# Patient Record
Sex: Female | Born: 1965 | Race: Black or African American | Hispanic: No | Marital: Married | Smoking: Never smoker
Health system: Southern US, Community
[De-identification: ages and names within clinical notes are randomized; demographics above are authoritative.]

## PROBLEM LIST (undated history)

## (undated) DIAGNOSIS — Z9889 Other specified postprocedural states: Secondary | ICD-10-CM

## (undated) DIAGNOSIS — M549 Dorsalgia, unspecified: Secondary | ICD-10-CM

## (undated) DIAGNOSIS — E7800 Pure hypercholesterolemia, unspecified: Secondary | ICD-10-CM

## (undated) DIAGNOSIS — R600 Localized edema: Secondary | ICD-10-CM

## (undated) DIAGNOSIS — G4730 Sleep apnea, unspecified: Secondary | ICD-10-CM

## (undated) DIAGNOSIS — F419 Anxiety disorder, unspecified: Secondary | ICD-10-CM

## (undated) DIAGNOSIS — D649 Anemia, unspecified: Secondary | ICD-10-CM

## (undated) DIAGNOSIS — R7303 Prediabetes: Secondary | ICD-10-CM

## (undated) DIAGNOSIS — K5090 Crohn's disease, unspecified, without complications: Secondary | ICD-10-CM

## (undated) DIAGNOSIS — E559 Vitamin D deficiency, unspecified: Secondary | ICD-10-CM

## (undated) DIAGNOSIS — J302 Other seasonal allergic rhinitis: Secondary | ICD-10-CM

## (undated) DIAGNOSIS — R112 Nausea with vomiting, unspecified: Secondary | ICD-10-CM

## (undated) DIAGNOSIS — K5900 Constipation, unspecified: Secondary | ICD-10-CM

## (undated) DIAGNOSIS — K219 Gastro-esophageal reflux disease without esophagitis: Secondary | ICD-10-CM

## (undated) DIAGNOSIS — I10 Essential (primary) hypertension: Secondary | ICD-10-CM

## (undated) DIAGNOSIS — M1990 Unspecified osteoarthritis, unspecified site: Secondary | ICD-10-CM

## (undated) DIAGNOSIS — M25569 Pain in unspecified knee: Secondary | ICD-10-CM

## (undated) DIAGNOSIS — E739 Lactose intolerance, unspecified: Secondary | ICD-10-CM

## (undated) DIAGNOSIS — M2550 Pain in unspecified joint: Secondary | ICD-10-CM

## (undated) DIAGNOSIS — R0683 Snoring: Secondary | ICD-10-CM

## (undated) HISTORY — PX: ABDOMINAL HYSTERECTOMY: SHX81

## (undated) HISTORY — DX: Vitamin D deficiency, unspecified: E55.9

## (undated) HISTORY — DX: Lactose intolerance, unspecified: E73.9

## (undated) HISTORY — PX: ESOPHAGOGASTRODUODENOSCOPY: SHX1529

## (undated) HISTORY — PX: COLONOSCOPY: SHX5424

## (undated) HISTORY — PX: COLON SURGERY: SHX602

## (undated) HISTORY — PX: GASTRIC BYPASS: SHX52

## (undated) HISTORY — PX: APPENDECTOMY: SHX54

## (undated) HISTORY — DX: Pain in unspecified joint: M25.50

## (undated) HISTORY — DX: Unspecified osteoarthritis, unspecified site: M19.90

## (undated) HISTORY — DX: Pain in unspecified knee: M25.569

## (undated) HISTORY — DX: Pure hypercholesterolemia, unspecified: E78.00

## (undated) HISTORY — DX: Localized edema: R60.0

## (undated) HISTORY — DX: Constipation, unspecified: K59.00

## (undated) HISTORY — DX: Anemia, unspecified: D64.9

## (undated) HISTORY — DX: Dorsalgia, unspecified: M54.9

## (undated) HISTORY — DX: Anxiety disorder, unspecified: F41.9

## (undated) HISTORY — PX: CHOLECYSTECTOMY: SHX55

## (undated) HISTORY — DX: Gastro-esophageal reflux disease without esophagitis: K21.9

---

## 1986-03-06 HISTORY — PX: CHOLECYSTECTOMY: SHX55

## 1997-07-16 ENCOUNTER — Other Ambulatory Visit: Admission: RE | Admit: 1997-07-16 | Discharge: 1997-07-16 | Payer: Self-pay | Admitting: Obstetrics and Gynecology

## 1998-10-28 ENCOUNTER — Other Ambulatory Visit: Admission: RE | Admit: 1998-10-28 | Discharge: 1998-10-28 | Payer: Self-pay | Admitting: Obstetrics and Gynecology

## 1999-11-01 ENCOUNTER — Other Ambulatory Visit: Admission: RE | Admit: 1999-11-01 | Discharge: 1999-11-01 | Payer: Self-pay | Admitting: Obstetrics and Gynecology

## 2001-02-15 ENCOUNTER — Inpatient Hospital Stay (HOSPITAL_COMMUNITY): Admission: AD | Admit: 2001-02-15 | Discharge: 2001-02-15 | Payer: Self-pay | Admitting: Obstetrics and Gynecology

## 2001-03-29 ENCOUNTER — Inpatient Hospital Stay (HOSPITAL_COMMUNITY): Admission: AD | Admit: 2001-03-29 | Discharge: 2001-03-31 | Payer: Self-pay | Admitting: Obstetrics & Gynecology

## 2001-05-07 ENCOUNTER — Other Ambulatory Visit: Admission: RE | Admit: 2001-05-07 | Discharge: 2001-05-07 | Payer: Self-pay | Admitting: Obstetrics & Gynecology

## 2002-07-04 ENCOUNTER — Other Ambulatory Visit: Admission: RE | Admit: 2002-07-04 | Discharge: 2002-07-04 | Payer: Self-pay | Admitting: Obstetrics and Gynecology

## 2005-01-12 ENCOUNTER — Other Ambulatory Visit: Admission: RE | Admit: 2005-01-12 | Discharge: 2005-01-12 | Payer: Self-pay | Admitting: Obstetrics and Gynecology

## 2005-07-20 ENCOUNTER — Encounter (INDEPENDENT_AMBULATORY_CARE_PROVIDER_SITE_OTHER): Payer: Self-pay | Admitting: Specialist

## 2005-07-20 ENCOUNTER — Inpatient Hospital Stay (HOSPITAL_COMMUNITY): Admission: RE | Admit: 2005-07-20 | Discharge: 2005-07-22 | Payer: Self-pay | Admitting: Obstetrics and Gynecology

## 2005-07-20 HISTORY — PX: TOTAL VAGINAL HYSTERECTOMY: SHX2548

## 2007-01-02 ENCOUNTER — Ambulatory Visit: Payer: Self-pay | Admitting: Surgery

## 2007-01-02 ENCOUNTER — Encounter (INDEPENDENT_AMBULATORY_CARE_PROVIDER_SITE_OTHER): Payer: Self-pay | Admitting: Family Medicine

## 2007-01-02 ENCOUNTER — Ambulatory Visit (HOSPITAL_COMMUNITY): Admission: RE | Admit: 2007-01-02 | Discharge: 2007-01-02 | Payer: Self-pay | Admitting: Family Medicine

## 2007-05-10 ENCOUNTER — Ambulatory Visit (HOSPITAL_BASED_OUTPATIENT_CLINIC_OR_DEPARTMENT_OTHER): Admission: RE | Admit: 2007-05-10 | Discharge: 2007-05-10 | Payer: Self-pay | Admitting: Specialist

## 2007-05-10 HISTORY — PX: KNEE ARTHROSCOPY: SUR90

## 2008-09-11 ENCOUNTER — Encounter: Admission: RE | Admit: 2008-09-11 | Discharge: 2008-09-11 | Payer: Self-pay | Admitting: Rheumatology

## 2010-02-14 ENCOUNTER — Ambulatory Visit: Admission: RE | Admit: 2010-02-14 | Payer: Self-pay | Source: Home / Self Care | Admitting: Specialist

## 2010-05-09 ENCOUNTER — Ambulatory Visit (HOSPITAL_BASED_OUTPATIENT_CLINIC_OR_DEPARTMENT_OTHER)
Admission: RE | Admit: 2010-05-09 | Discharge: 2010-05-09 | Disposition: A | Discharge status: Home / Self Care | Payer: BC Managed Care – PPO | Source: Ambulatory Visit | Attending: Specialist | Admitting: Specialist

## 2010-05-09 DIAGNOSIS — I10 Essential (primary) hypertension: Secondary | ICD-10-CM | POA: Insufficient documentation

## 2010-05-09 DIAGNOSIS — M23305 Other meniscus derangements, unspecified medial meniscus, unspecified knee: Secondary | ICD-10-CM | POA: Insufficient documentation

## 2010-05-09 DIAGNOSIS — M2240 Chondromalacia patellae, unspecified knee: Secondary | ICD-10-CM | POA: Insufficient documentation

## 2010-05-09 DIAGNOSIS — E669 Obesity, unspecified: Secondary | ICD-10-CM | POA: Insufficient documentation

## 2010-05-09 DIAGNOSIS — M1710 Unilateral primary osteoarthritis, unspecified knee: Secondary | ICD-10-CM | POA: Insufficient documentation

## 2010-05-09 HISTORY — PX: KNEE ARTHROSCOPY: SUR90

## 2010-05-09 LAB — POCT HEMOGLOBIN-HEMACUE: Hemoglobin: 13.3 g/dL (ref 12.0–15.0)

## 2010-07-19 NOTE — Op Note (Signed)
Vanessa Dean, Vanessa Dean               ACCOUNT NO.:  [id number]   MEDICAL RECORD NO.:  [id number]          PATIENT TYPE:  AMB   LOCATION:  [facility]                         FACILITY:  [facility]   PHYSICIAN:  Susa Day, M.D.    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   PROCEDURE:  Left knee arthroscopy, partial lateral meniscectomy, partial  synovectomy, chondroplasty of the patella.   BRIEF HISTORY AND INDICATIONS:  The patient is a 45 year old with  refractory knee pain, patellofemoral pain with compression, had some  popping posteriorly.  Suspected lateral meniscus tear.  Indicated for  arthroscopic debridement.  Risks and benefits discussed, including  bleeding, infection, damage to neurovascular structures, DVT, PE,  anesthetic complications, no change in symptoms, worsening of symptoms,  need for repeat debridement, arthroscopic procedure in future, etc.   TECHNIQUE:  With the patient in the supine position after induction of  adequate general anesthesia and 2 grams of Kefzol, the left lower  extremity was prepped and draped in the usual sterile fashion.  A  lateral parapatellar portal and superomedial parapatellar portal were  fashioned with a #11 blade.  Ingress cannula atraumatically placed, and  irrigant was utilized to insufflate the joint.  Under direct  visualization, a medial parapatellar portal was fashioned with a #11  blade after localization with an 18-gauge needle, sparing the medial  meniscus.  Noted was extensive grade 3 changes of the patella.  A 3.5  Cuda shaver was introduced and utilized to perform a chondroplasty of  the patella.  There  was normal patellofemoral tracking.  There was a  small area which  showed some grade 2-3 changes as well.  ACL was  unremarkable.  Gutters were unremarkable.  Lateral compartment revealed  some minor grade 2 to 3 changes of the lateral compartment, degenerative  fraying of the meniscus.  There was also an area of a hypertrophic  synovium that was noted in the lateral compartment as well, and this was  shaved.  A remnant of the meniscus was stable to probe palpation.  We  examined the top and bottom of it as well as the medial compartment.  The medial compartment was essentially unremarkable.  There was normal  femoral condyle, tibial plateau, and meniscus, stable to probe palpation  without evidence of tear.  We copiously lavaged the knee and reexamined  all compartments.  No loose cartilaginous debris was noted.  Reexamined  the patella.  No further pathology amenable to arthroscopic  intervention.  All instrumentation was removed.  Portal was closed with  4-0 nylon simple sutures.  0.25% Marcaine with epinephrine was  infiltrated in the joint.  The wound was dressed sterilely.  The patient  was awakened without difficulty and transported to the recovery room in  satisfactory condition.   The patient tolerated the procedure well, and there were no  complications.     Susa Day, M.D.  Electronically Signed    JB/MEDQ  D:  05/10/2007  T:  05/12/2007  Job:  [id number]

## 2010-07-22 NOTE — Op Note (Signed)
Vanessa Dean, Vanessa Dean NO.:  [id number]   MEDICAL RECORD NO.:  [id number]          PATIENT TYPE:  AMB   LOCATION:  [place]                           FACILITY:  [facility]   PHYSICIAN:  Gus Height, M.D.       DATE OF BIRTH:  December 17, 1965   DATE OF PROCEDURE:  07/20/2005  DATE OF DISCHARGE:                                 OPERATIVE REPORT   PREOPERATIVE DIAGNOSES:  1.  Pelvic relaxation with uterine descensus.  2.  Rectocele.   POSTOPERATIVE DIAGNOSES:  Same   PROCEDURE:  Total vaginal hysterectomy and posterior colporrhaphy.   SURGEON:  Gus Height, M.D.   ASSISTANT:  Barbaraann Rondo, M.D.   ANESTHESIA:  General.   COMPLICATIONS:  None.   JUSTIFICATION:  The patient is a 45 year old black female with symptomatic  pelvic relaxation, with the cervix protruding through the vaginal orifice.  In addition, she is noted to have a large rectocele and possible enterocele.  Because of the symptoms associated with her pelvic relaxation, she presents  now to undergo definitive surgery and repair, with total vaginal  hysterectomy and posterior colporrhaphy and anterior colporrhaphy indicated.  The risks and limitations of the procedure were discussed with the patient.   PROCEDURE:  The patient was taken to the operating and placed in the supine  position.  General anesthesia was administered without difficulty.  She was  then placed in the dorsal lithotomy position and prepped and draped in the  usual sterile fashion.  The bladder was catheterized.  At this point, a  speculum as placed in the vaginal vault.  The cervical lip was grasped with  a tenaculum, with the cervix then presenting past the introitus.  The cervix  was injected with 1% Xylocaine with epinephrine, and then the mucosa was  circumscribed and dissected anteriorly and posteriorly until the peritoneal  reflections could be found.  After this was done, the anterior peritoneum  was entered, and the posterior peritoneum  was entered without difficulty.  Then, using curved Heaney clamps, the uterosacral ligaments were identified,  clamped, cut, suture ligated using suture ligatures of 0 Vicryl.  These were  then held.  Then, the cardinal ligaments were clamped, cut, and suture  ligated using 0 Vicryl suture ligatures.  Additional bites were then taken  of the paracervical fascia, all using curved Heaney clamps and suture  ligatures of 0 Vicryl.  The uterine vessels were then found, clamped, cut,  and suture ligated in a similar fashion.  Dissection continued along the  broad ligament until it was possible to deliver the uterine fundus through  the cul-de-sac.  Then, the additional structures holding the uterus in place  which included the utero-ovarian ligament, fallopian tube, and round  ligament, were clamped with Heaney clamps.  These pedicles were cut, freeing  the specimen.  These large pedicles were then doubly ligated, first using  suture ligatures of 0 Vicryl and free ties of 0 Vicryl.  These pedicles were  held. At this point, the posterior vaginal cuff was run using running-  interlocking 0 Vicryl suture this, but  patient did have a small enterocele.  The enterocele was reduced and then the cul-de-sac plicated using sutures of  0 Vicryl.  Then, the uterosacral ligaments were brought across the cuff to  provide support, with the uterosacral ligaments being transfixed to one  another to supply support to the excess vaginal cuff.  At this point, the  anterior peritoneum was then found, and then the cuff was closed using  running-interlocking 0 Vicryl suture. Good hemostasis was readily achieved.  At this point, inspection was carried out.  Again, at this point it was  apparent that the patient did not have much in the way of cystocele or  urethrocele, but a large rectocele.  The perineal skin was grasped with  Allis clamps, and the posterior vaginal mucosa was injected with 1%  Xylocaine with  epinephrine, and then the dissection was carried out in the  midline, separating the overlying mucosa from the perirectal fascia.  This  was continued for approximately 6 cm into the vaginal vault.  Then, the  rectocele was dissected free from the paravaginal tissues, and the rectocele  was reduced using interrupted 2-0 Vicryl sutures.  It was then possible to  bring the fascia over the reduced rectocele using interrupted 2-0 Vicryl  sutures.  At this point, the excess vaginal mucosa was trimmed, and then the  mucosa and submucosa were reapproximated using running-interlocking 0 Vicryl  sutures.  At this point, 0 Vicryl sutures were placed in the perineal body  to reconstitute the perineal body and reapproximated.  Then, the cutaneous  tissue was closed using running-interlocking 2-0 Vicryl suture.  The  perineal skin was reapproximated using running continuous subcuticular 2-0  Vicryl suture.  There appeared to be good support of the posterior vaginal  wall and good hemostasis.  Foley catheter was inserted.  Clear urine was  obtained.  A 2-inch Iodoform pack was placed in the vaginal vault to provide  pressure to ensure hemostasis.  At this point, the patient is taken out of  lithotomy position, reversed from the anesthetic, and brought to the  recovery room in satisfactory condition.  The estimated blood loss was  approximately to 200 cc.      Gus Height, M.D.  Electronically Signed     AR/MEDQ  D:  07/20/2005  T:  07/21/2005  Job:  [id number]

## 2010-07-22 NOTE — Discharge Summary (Signed)
Vanessa Dean, PORTNOY NO.:  [id number]   MEDICAL RECORD NO.:  [id number]          PATIENT TYPE:  INP   LOCATION:  9311                          FACILITY:  [facility]   PHYSICIAN:  Gus Height, M.D.       DATE OF BIRTH:  04/23/1965   DATE OF ADMISSION:  07/20/2005  DATE OF DISCHARGE:  07/22/2005                                 DISCHARGE SUMMARY   ADMISSION DIAGNOSIS:  Pelvic relaxation with rectocele, enterocele, uterine  descensus.   FINAL DIAGNOSES:  Pelvic relaxation with rectocele, enterocele, uterine  descensus.   OPERATIONS AND PROCEDURES:  Total vaginal hysterectomy, posterior  colporrhaphy, repair of enterocele.   BRIEF HISTORY:  The patient is a 45 year old black female who has had a  history of pelvic pressure and pain, and on examination was noted to have a  large rectocele, as well as her cervix protruding to the vaginal introitus.  Because of the symptoms associated with this defect in the pelvic floor and  her desire to have her symptoms relieved, she was admitted to the hospital  to undergo total vaginal hysterectomy, posterior colporrhaphy and repair of  any other pelvic floor defects identified at the time of surgery.   Preoperative studies were obtained which included an admission hemoglobin of  13.3, hematocrit 38.7, white count 5900.  PT and PTT were within normal  limits.  Chemistry profile was essentially within normal limits.   HOSPITAL COURSE:  Under general anesthesia on Jul 20, 2005, a total vaginal  hysterectomy was carried out.  At the time of surgery, she was also noted to  have an enterocele.  The enterocele was repaired, as well as her large  rectocele.  The patient's postoperative course was essentially  uncomplicated.  She did tolerate increasing ambulation and diet well, and by  the second postoperative day was in satisfactory condition and able to be  discharged home.  Her hemoglobin remained stable with a nadir of 11.7.  The  final  pathology report of the hysterectomy specimen revealed the uterus  weighing 123 gm with an endocervical inclusion cyst.  Proliferative  endometrium was noted, as well as adenomyosis and intramural leiomyomata.  The patient was sent home in satisfactory condition on a regular diet,  instructed to do no heavy lifting, and to call for any problems such as  fever, pain or heavy bleeding.   MEDICATIONS FOR HOME:  Tylox one every 3 hours as needed for pain.   FOLLOWUP:  The patient will be seen back in 4 weeks for a follow up  examination, and she understands to call for any problems such as fever,  pain or heavy bleeding.      Gus Height, M.D.  Electronically Signed     AR/MEDQ  D:  08/10/2005  T:  08/10/2005  Job:  [id number]

## 2010-09-13 NOTE — Op Note (Signed)
  [name], Vanessa Dean               ACCOUNT NO.:  [id number]  MEDICAL RECORD NO.:  [id number]          PATIENT TYPE:  AMB  LOCATION:  [facility]                         FACILITY:  [facility]  PHYSICIAN:  Susa Day, M.D.    DATE OF BIRTH:  Sep 22, 1965  DATE OF PROCEDURE: DATE OF DISCHARGE:                              OPERATIVE REPORT   PREOPERATIVE DIAGNOSIS:  Medial meniscus tear, degenerative joint disease, right knee.  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  PROCEDURE PERFORMED: 1. Right knee arthroscopy. 2. Partial medial meniscectomy. 3. Light chondroplasty of patellofemoral joint and medial compartment.  HISTORY:  A 45 year old with knee pain, locking, giving way.  MRI indicating meniscus tear, DJD, indicated for arthroscopic debridement. Risks and benefits discussed including bleeding, infection, no changes in symptoms, worsening symptoms, need for repeat debridement, DVT, PE and anesthetic complications, etc.  She is also indicated for possible lateral retinacular release but she did not have any patellofemoral pain preoperatively.  TECHNIQUE:  With the patient in supine position after adequate anesthesia and 2 g Kefzol, the right lower extremity was prepped and draped in the usual sterile fashion.  A lateral parapatellar portal and superomedial parapatellar portal was fashioned with a #11 blade. Ingress cannula atraumatically placed.  Irrigant was utilized to insufflate the joint.  Under direct visualization, medial parapatellar portal was fashioned with a #11 blade after localization with an 18- gauge needle sparing the medial meniscus.  Noted was extensive grade 3 changes in the medial compartment, tearing of the medial meniscus, grade 3 changes of the tibia.  I introduced the shaver and performed a light chondroplasty of the femoral  condyle and the tibial plateau.  I shaved the meniscus to the stable base.  Degenerative changes of the posterior portion of the meniscus were stable to probe palpation, however.  I did not see any grade 3 changes but there are couple areas where it was fairly thin.  ACL was essentially unremarkable and I am unable to visualize the PCL. Lateral compartment revealed some mild degenerative changes, grade 2.  Suprapatellar pouch revealed extensive patellofemoral degenerative changes that were throughout not just laterally and actually the patient appeared to be tracking fine, was not tight laterally.  This was with insufflation and releasing the fluid and watching the tracking.  So, I did not feel she would benefit particularly from the lateral retinacular release.  Light chondroplasties were performed here.  The gutters were unremarkable.  I reexamined the medial compartment.  No further pathology amenable arthroscopic intervention.  Therefore, I removed all instrumentation and portals were closed 4-0 nylon simple sutures, 0.25% Marcaine with epinephrine was infiltrated in the joint.  Wound was dressed sterilely.  Woken without difficulty and transported to the recovery in satisfactory condition.  The patient tolerated the procedure well.  No complications.  No assistant.     Susa Day, M.D.     Geralynn Rile  D:  05/09/2010  T:  05/10/2010  Job:  [id number]  Electronically Signed by Susa Day M.D. on 05/13/2010 05:49:43 AM

## 2010-11-28 LAB — POCT I-STAT 4, (NA,K, GLUC, HGB,HCT)
Glucose, Bld: 94
HCT: 41
Hemoglobin: 13.9
Operator id: 268271
Potassium: 3.5
Sodium: 139

## 2011-05-09 ENCOUNTER — Encounter (HOSPITAL_BASED_OUTPATIENT_CLINIC_OR_DEPARTMENT_OTHER): Payer: Self-pay | Admitting: *Deleted

## 2011-05-10 ENCOUNTER — Encounter (HOSPITAL_BASED_OUTPATIENT_CLINIC_OR_DEPARTMENT_OTHER): Payer: Self-pay | Admitting: *Deleted

## 2011-05-11 ENCOUNTER — Other Ambulatory Visit: Payer: Self-pay | Admitting: Orthopedic Surgery

## 2011-05-15 NOTE — H&P (Signed)
Vanessa Dean is an 46 y.o. female.   Chief Complaint: Complaining of bilateral hand numbness and tingling HPI:  . 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 She reports a history of Crohn's disease dating back to age 15. She had some transient arthralgias and underwent a small bowel resection, cholecystectomy, appendectomy and partial colectomy followed by significant relief in her abdominal symptoms. She has had some arthralgias and was thought to have a possible inflammatory bowel disease related arthritis predicament.    Past Medical History  Diagnosis Date  . Arthritis   . Seasonal allergies     TAKES ZYRTEC AS NEEDED    Past Surgical History  Procedure Date  . Knee arthroscopy 05/09/2010    right   . Total vaginal hysterectomy 07/20/2005    posterior colporrhaphy  . Knee arthroscopy 05/10/2007    left  . Colon surgery   . Abdominal hysterectomy   . Appendectomy   . Cholecystectomy     History reviewed. No pertinent family history. Social History:  reports that she has never smoked. She does not have any smokeless tobacco history on file. She reports that she drinks alcohol. She reports that she does not use illicit drugs.  Allergies: No Known Allergies  No current facility-administered medications on file as of .   Medications Prior to Admission  Medication Sig Dispense Refill  . cetirizine (ZYRTEC) 10 MG tablet Take 10 mg by mouth as needed.      . hydrochlorothiazide (MICROZIDE) 12.5 MG capsule Take 12.5 mg by mouth as needed. TAKES AS NEEDED FOR BLOATING      . meloxicam (MOBIC) 15 MG tablet Take 15 mg by mouth as needed.      . Vitamin D, Ergocalciferol, ([name]) 50000 UNITS CAPS Take 50,000 Units by mouth 2 (two) times a week.        No results found for this or any previous visit (from the past 48 hour(s)).  No results found.   Pertinent items are noted in HPI.  Height 5' 5"   (1.651 m), weight 135.172 kg (298 lb).  General appearance: alert Head: Normocephalic, without obvious abnormality Neck: supple, symmetrical, trachea midline Resp: clear to auscultation bilaterally Cardio: regular rate and rhythm, S1, S2 normal, no murmur, click, rub or gallop GI: normal findings: bowel sounds normal Extremities: . Inspection of her hands and arms reveals diminished sweating in the median distribution bilaterally. She has a positive wrist flexion test at 20 seconds bilaterally. She has no sign of ulnar entrapment neuropathy or radial entrapment neuropathy. She has no sign of stenosing tenosynovitis. Her pulses and capillary refill are intact. We obtained  detailed electrodiagnostic studies. These document bilateral severe carpal tunnel syndrome with marked decrease in her sensory amplitudes left worse than right.   Screening x-rays of her hands and wrists demonstrate some squaring of the pole of her scaphoid bilaterally. She has some minimal signs of early osteoarthritis. I do not detect evidence of significant chondrolysis or erosive arthritis.  Pulses: 2+ and symmetric Skin: mobility and turgor normal Neurologic: Grossly normal    Assessment/Plan Impression: Severe bilateral carpal tunnel syndrome  Plan: Patient to be taken to the operating room to undergo left carpal tunnel release. The procedure risks, benefits, and postoperative course were discussed with the patient at length and she was in agreement with this plan.  DASNOIT,Alka Falwell J 05/15/2011, 4:38 PM   H&P documentation: 05/16/2011  -History and Physical Reviewed  -Patient  has been re-examined  -No change in the plan of care  Cammie Sickle, MD

## 2011-05-16 ENCOUNTER — Encounter (HOSPITAL_BASED_OUTPATIENT_CLINIC_OR_DEPARTMENT_OTHER): Payer: Self-pay | Admitting: Anesthesiology

## 2011-05-16 ENCOUNTER — Ambulatory Visit (HOSPITAL_BASED_OUTPATIENT_CLINIC_OR_DEPARTMENT_OTHER): Payer: BC Managed Care – PPO | Admitting: Anesthesiology

## 2011-05-16 ENCOUNTER — Ambulatory Visit (HOSPITAL_BASED_OUTPATIENT_CLINIC_OR_DEPARTMENT_OTHER)
Admission: RE | Admit: 2011-05-16 | Discharge: 2011-05-16 | Disposition: A | Discharge status: Home / Self Care | Payer: BC Managed Care – PPO | Source: Ambulatory Visit | Attending: Orthopedic Surgery | Admitting: Orthopedic Surgery

## 2011-05-16 ENCOUNTER — Encounter (HOSPITAL_BASED_OUTPATIENT_CLINIC_OR_DEPARTMENT_OTHER)
Admission: RE | Disposition: A | Discharge status: Home / Self Care | Payer: Self-pay | Source: Ambulatory Visit | Attending: Orthopedic Surgery

## 2011-05-16 ENCOUNTER — Encounter (HOSPITAL_BASED_OUTPATIENT_CLINIC_OR_DEPARTMENT_OTHER): Payer: Self-pay

## 2011-05-16 DIAGNOSIS — G5600 Carpal tunnel syndrome, unspecified upper limb: Secondary | ICD-10-CM | POA: Insufficient documentation

## 2011-05-16 HISTORY — DX: Unspecified osteoarthritis, unspecified site: M19.90

## 2011-05-16 HISTORY — DX: Other seasonal allergic rhinitis: J30.2

## 2011-05-16 HISTORY — PX: CARPAL TUNNEL RELEASE: SHX101

## 2011-05-16 LAB — POCT HEMOGLOBIN-HEMACUE: Hemoglobin: 13.5 g/dL (ref 12.0–15.0)

## 2011-05-16 SURGERY — CARPAL TUNNEL RELEASE
Anesthesia: General | Site: Wrist | Laterality: Left | Wound class: Clean

## 2011-05-16 MED ORDER — FENTANYL CITRATE 0.05 MG/ML IJ SOLN
INTRAMUSCULAR | Status: DC | PRN
  Administered 2011-05-16 (×3): 50 ug via INTRAVENOUS

## 2011-05-16 MED ORDER — LIDOCAINE HCL 2 % IJ SOLN
INTRAMUSCULAR | Status: DC | PRN
  Administered 2011-05-16: 3 mL

## 2011-05-16 MED ORDER — HYDROCODONE-ACETAMINOPHEN 5-325 MG PO TABS: ORAL_TABLET | ORAL | Status: AC

## 2011-05-16 MED ORDER — DROPERIDOL 2.5 MG/ML IJ SOLN
INTRAMUSCULAR | Status: DC | PRN
  Administered 2011-05-16: 0.625 mg via INTRAVENOUS

## 2011-05-16 MED ORDER — FENTANYL CITRATE 0.05 MG/ML IJ SOLN: 25.0000 ug | INTRAMUSCULAR | Status: DC | PRN

## 2011-05-16 MED ORDER — LACTATED RINGERS IV SOLN
INTRAVENOUS | Status: DC | PRN
  Administered 2011-05-16: 09:00:00 via INTRAVENOUS

## 2011-05-16 MED ORDER — PROPOFOL 10 MG/ML IV EMUL
INTRAVENOUS | Status: DC | PRN
  Administered 2011-05-16: 300 mg via INTRAVENOUS

## 2011-05-16 MED ORDER — METOCLOPRAMIDE HCL 5 MG/ML IJ SOLN: 10.0000 mg | Freq: Once | INTRAMUSCULAR | Status: DC | PRN

## 2011-05-16 MED ORDER — LACTATED RINGERS IV SOLN
INTRAVENOUS | Status: DC
  Administered 2011-05-16: 10:00:00 via INTRAVENOUS

## 2011-05-16 MED ORDER — LIDOCAINE HCL (CARDIAC) 20 MG/ML IV SOLN
INTRAVENOUS | Status: DC | PRN
  Administered 2011-05-16: 100 mg via INTRAVENOUS

## 2011-05-16 MED ORDER — MORPHINE SULFATE 4 MG/ML IJ SOLN: 0.0500 mg/kg | INTRAMUSCULAR | Status: DC | PRN

## 2011-05-16 MED ORDER — DEXAMETHASONE SODIUM PHOSPHATE 10 MG/ML IJ SOLN
INTRAMUSCULAR | Status: DC | PRN
  Administered 2011-05-16: 10 mg via INTRAVENOUS

## 2011-05-16 SURGICAL SUPPLY — 38 items
BANDAGE ADHESIVE 1X3 (GAUZE/BANDAGES/DRESSINGS) IMPLANT
BANDAGE ELASTIC 3 VELCRO ST LF (GAUZE/BANDAGES/DRESSINGS) ×2 IMPLANT
BLADE SURG 15 STRL LF DISP TIS (BLADE) ×1 IMPLANT
BLADE SURG 15 STRL SS (BLADE) ×1
BNDG ESMARK 4X9 LF (GAUZE/BANDAGES/DRESSINGS) ×2 IMPLANT
BRUSH SCRUB EZ PLAIN DRY (MISCELLANEOUS) ×2 IMPLANT
CLOTH BEACON ORANGE TIMEOUT ST (SAFETY) ×2 IMPLANT
CORDS BIPOLAR (ELECTRODE) ×2 IMPLANT
COVER MAYO STAND STRL (DRAPES) ×2 IMPLANT
COVER TABLE BACK 60X90 (DRAPES) ×2 IMPLANT
CUFF TOURNIQUET SINGLE 18IN (TOURNIQUET CUFF) IMPLANT
CUFF TOURNIQUET SINGLE 24IN (TOURNIQUET CUFF) ×2 IMPLANT
DECANTER SPIKE VIAL GLASS SM (MISCELLANEOUS) ×2 IMPLANT
DRAPE EXTREMITY T 121X128X90 (DRAPE) ×2 IMPLANT
DRAPE SURG 17X23 STRL (DRAPES) ×2 IMPLANT
GLOVE BIO SURGEON STRL SZ 6.5 (GLOVE) ×2 IMPLANT
GLOVE BIOGEL M STRL SZ7.5 (GLOVE) ×2 IMPLANT
GLOVE ORTHO TXT STRL SZ7.5 (GLOVE) ×2 IMPLANT
GOWN BRE IMP PREV XXLGXLNG (GOWN DISPOSABLE) ×4 IMPLANT
GOWN PREVENTION PLUS XLARGE (GOWN DISPOSABLE) ×2 IMPLANT
GOWN PREVENTION PLUS XXLARGE (GOWN DISPOSABLE) IMPLANT
NEEDLE 27GAX1X1/2 (NEEDLE) ×2 IMPLANT
PACK BASIN DAY SURGERY FS (CUSTOM PROCEDURE TRAY) ×2 IMPLANT
PAD CAST 3X4 CTTN HI CHSV (CAST SUPPLIES) ×1 IMPLANT
PADDING CAST ABS 4INX4YD NS (CAST SUPPLIES)
PADDING CAST ABS COTTON 4X4 ST (CAST SUPPLIES) IMPLANT
PADDING CAST COTTON 3X4 STRL (CAST SUPPLIES) ×1
SPLINT PLASTER CAST XFAST 3X15 (CAST SUPPLIES) ×5 IMPLANT
SPLINT PLASTER XTRA FASTSET 3X (CAST SUPPLIES) ×5
SPONGE GAUZE 4X4 12PLY (GAUZE/BANDAGES/DRESSINGS) IMPLANT
STOCKINETTE 4X48 STRL (DRAPES) ×2 IMPLANT
STRIP CLOSURE SKIN 1/2X4 (GAUZE/BANDAGES/DRESSINGS) ×2 IMPLANT
SUT PROLENE 3 0 PS 2 (SUTURE) ×2 IMPLANT
SYR 3ML 23GX1 SAFETY (SYRINGE) IMPLANT
SYR CONTROL 10ML LL (SYRINGE) ×2 IMPLANT
TRAY DSU PREP LF (CUSTOM PROCEDURE TRAY) ×2 IMPLANT
UNDERPAD 30X30 INCONTINENT (UNDERPADS AND DIAPERS) ×2 IMPLANT
WATER STERILE IRR 1000ML POUR (IV SOLUTION) IMPLANT

## 2011-05-16 NOTE — Discharge Instructions (Signed)

## 2011-05-16 NOTE — Anesthesia Procedure Notes (Signed)
Procedure Name: LMA Insertion Date/Time: 05/16/2011 9:00 AM Performed by: Maryella Shivers Pre-anesthesia Checklist: Patient identified, Emergency Drugs available, Suction available and Patient being monitored Patient Re-evaluated:Patient Re-evaluated prior to inductionOxygen Delivery Method: Circle System Utilized Preoxygenation: Pre-oxygenation with 100% oxygen Intubation Type: IV induction Ventilation: Mask ventilation without difficulty LMA: LMA with gastric port inserted LMA Size: 4.0 Number of attempts: 1 Placement Confirmation: positive ETCO2 Tube secured with: Tape Dental Injury: Teeth and Oropharynx as per pre-operative assessment

## 2011-05-16 NOTE — Transfer of Care (Signed)
Immediate Anesthesia Transfer of Care Note  Patient: Vanessa Dean  Procedure(s) Performed: Procedure(s) (LRB): CARPAL TUNNEL RELEASE (Left)  Patient Location: PACU  Anesthesia Type: General  Level of Consciousness: sedated  Airway & Oxygen Therapy: Patient Spontanous Breathing and Patient connected to face mask oxygen  Post-op Assessment: Report given to PACU RN and Post -op Vital signs reviewed and stable  Post vital signs: Reviewed and stable  Complications: No apparent anesthesia complications

## 2011-05-16 NOTE — Op Note (Signed)
Op note dictated 05/16/11 [id number]

## 2011-05-16 NOTE — Anesthesia Postprocedure Evaluation (Signed)
Anesthesia Post Note  Patient: Vanessa Dean  Procedure(s) Performed: Procedure(s) (LRB): CARPAL TUNNEL RELEASE (Left)  Anesthesia type: General  Patient location: PACU  Post pain: Pain level controlled  Post assessment: Patient's Cardiovascular Status Stable  Last Vitals:  Filed Vitals:   05/16/11 1115  BP: 130/86  Pulse: 70  Temp:   Resp: 16    Post vital signs: Reviewed and stable  Level of consciousness: alert  Complications: No apparent anesthesia complications

## 2011-05-16 NOTE — Brief Op Note (Signed)
05/16/2011  9:27 AM  PATIENT:  Vanessa Dean  46 y.o. female  PRE-OPERATIVE DIAGNOSIS:  carpal tunnel syndrome on left  POST-OPERATIVE DIAGNOSIS:  carpal tunnel syndrome on left  PROCEDURE: CARPAL TUNNEL RELEASE LEFT   SURGEON:  Cammie Sickle., MD   PHYSICIAN ASSISTANT:   ASSISTANTS: Kathyrn Sheriff.A-C   ANESTHESIA:   general  EBL: minimal  BLOOD ADMINISTERED:none  DRAINS: none   LOCAL MEDICATIONS USED:  LIDOCAINE 3.5 CC 2%  SPECIMEN:  No Specimen  DISPOSITION OF SPECIMEN:  N/A  COUNTS:  YES  TOURNIQUET:   Total Tourniquet Time Documented: Upper Arm (Left) - 11 minutes  DICTATION: .Other Dictation: Dictation Number [id number]  PLAN OF CARE: Discharge to home after PACU  PATIENT DISPOSITION:  PACU - hemodynamically stable.

## 2011-05-16 NOTE — Anesthesia Preprocedure Evaluation (Signed)
Anesthesia Evaluation  Patient identified by MRN, date of birth, ID band Patient awake    Reviewed: Allergy & Precautions, H&P , NPO status , Patient's Chart, lab work & pertinent test results, reviewed documented beta blocker date and time   Airway Mallampati: II TM Distance: >3 FB Neck ROM: full    Dental   Pulmonary neg pulmonary ROS,          Cardiovascular negative cardio ROS      Neuro/Psych negative neurological ROS  negative psych ROS   GI/Hepatic negative GI ROS, Neg liver ROS,   Endo/Other  Morbid obesity  Renal/GU negative Renal ROS  negative genitourinary   Musculoskeletal   Abdominal   Peds  Hematology negative hematology ROS (+)   Anesthesia Other Findings See surgeon's H&P   Reproductive/Obstetrics negative OB ROS                           Anesthesia Physical Anesthesia Plan  ASA: III  Anesthesia Plan: General   Post-op Pain Management:    Induction: Intravenous  Airway Management Planned: LMA  Additional Equipment:   Intra-op Plan:   Post-operative Plan:   Informed Consent: I have reviewed the patients History and Physical, chart, labs and discussed the procedure including the risks, benefits and alternatives for the proposed anesthesia with the patient or authorized representative who has indicated his/her understanding and acceptance.     Plan Discussed with: CRNA and Surgeon  Anesthesia Plan Comments:         Anesthesia Quick Evaluation

## 2011-05-17 ENCOUNTER — Encounter (HOSPITAL_BASED_OUTPATIENT_CLINIC_OR_DEPARTMENT_OTHER): Payer: Self-pay | Admitting: Orthopedic Surgery

## 2011-05-17 NOTE — Op Note (Signed)
NAMEKALIS, FRIESE                    ACCOUNT NO.:  MEDICAL RECORD NO.:  [id number]  LOCATION:                                 FACILITY:  PHYSICIAN:  Youlanda Mighty. Tayleigh Wetherell, M.D.      DATE OF BIRTH:  DATE OF PROCEDURE:  05/16/2011 DATE OF DISCHARGE:                              OPERATIVE REPORT   PREOPERATIVE DIAGNOSIS:  Entrapment neuropathy of median nerve, left carpal tunnel with positive electrodiagnostic studies.  POSTOPERATIVE DIAGNOSIS:  Entrapment neuropathy of median nerve, left carpal tunnel with positive electrodiagnostic studies.  OPERATION:  Release of left transverse carpal ligament.  OPERATING SURGEON:  Youlanda Mighty. Cinderella Christoffersen, M.D.  ASSISTANT:  Marily Lente. Dasnoit, PA-C.  ANESTHESIA:  General by LMA.  SUPERVISING ANESTHESIOLOGIST:  Jessy Oto. Albertina Parr, M.D.  INDICATIONS:  Vanessa Dean is a 46 year old, [place] from [employer].  He was referred for evaluation and management of hand numbness and discomfort.  Clinical examination revealed signs of carpal tunnel syndrome.  Electrodiagnostic studies confirmed the presence of significant bilateral carpal tunnel syndrome.  After detailed informed consent, she is brought to the operating room at this time anticipating release of her left transcarpal ligament.  DESCRIPTION OF PROCEDURE:  Vanessa Dean was brought to room 2 of the [facility] and placed in supine position on the operating table.  Following the induction of general anesthesia by LMA technique under Dr. Roslynn Amble strict supervision, the left arm was prepped with Betadine soap and solution, sterilely draped.  A pneumatic tourniquet was applied to the proximal left brachium.  Following exsanguination of the left arm with Esmarch bandage, the arterial tourniquet was inflated to 250 mmHg due to mild systolic hypertension.  After routine surgical time-out, the procedure commenced with a short incision in the line of  the ring finger in the palm.  Subcutaneous tissues were carefully divided to reveal the palmar fascia.  This was split longitudinally to reveal the common sensory branch of the median nerve.  These were followed back to the transverse carpal ligament, which was gently isolated from the median nerve with a Insurance account manager.  The transverse carpal ligament was sequentially released with scissors. Our view was obstructed due to abundant adipose tissue due to a very high BMI.  We subsequently increased the length of the proximal incision to safely identify the median nerve, the volar carpal ligament, and the proximal margin of the transverse carpal ligament.  These were released subcutaneously fully decompressing the ulnar bursa and median nerve.  The wound was inspected for bleeding points, which were electrocauterized bipolar current, followed by repair of the skin with intradermal 3-0 Prolene suture.  A 3.5 mL of 2% lidocaine were infiltrated for postop analgesia.  The wound was then dressed with Steri-Strips, sterile gauze, sterile Webril, and a volar plaster splint maintaining the wrist in 10 degrees of dorsiflexion.  For aftercare, Vanessa Dean is provided prescription for hydrocodone 5 mg 1 p.o. q.4-6 hours p.r.n. pain, 20 tablets, without refill.  We will see her back for followup in our office in a week for dressing change, suture removal, and advancement to an excise program.  Youlanda Mighty Eileen Croswell, M.D.     RVS/MEDQ  D:  05/16/2011  T:  05/17/2011  Job:  [id number]

## 2011-08-03 ENCOUNTER — Other Ambulatory Visit: Payer: Self-pay | Admitting: Orthopedic Surgery

## 2011-08-04 ENCOUNTER — Encounter (HOSPITAL_BASED_OUTPATIENT_CLINIC_OR_DEPARTMENT_OTHER): Payer: Self-pay | Admitting: *Deleted

## 2011-08-07 NOTE — H&P (Signed)
  Vanessa Dean is an 46 y.o. female.   Chief Complaint: c/o chronic and progressive right hand numbness and tingling. HPI: . 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 She reports a history of Crohn's disease dating back to age 55. She had some transient arthralgias and underwent a small bowel resection, cholecystectomy, appendectomy and partial colectomy followed by significant relief in her abdominal symptoms. She has had some arthralgias and was thought to have a possible inflammatory bowel disease related arthritis predicament. She underwent left CTR recently and did well in the post-op period. She now desires to undergo right CTR.   Past Medical History  Diagnosis Date  . Arthritis   . Seasonal allergies     TAKES ZYRTEC AS NEEDED  . Fluid retention in legs   . Snores     Past Surgical History  Procedure Date  . Knee arthroscopy 05/09/2010    right   . Total vaginal hysterectomy 07/20/2005    posterior colporrhaphy  . Knee arthroscopy 05/10/2007    left  . Colon surgery   . Abdominal hysterectomy   . Appendectomy   . Cholecystectomy   . Carpal tunnel release 05/16/2011    Procedure: CARPAL TUNNEL RELEASE;  Surgeon: Cammie Sickle., MD;  Location: [facility];  Service: Orthopedics;  Laterality: Left;    No family history on file. Social History:  reports that she has never smoked. She does not have any smokeless tobacco history on file. She reports that she drinks alcohol. She reports that she does not use illicit drugs.  Allergies: No Known Allergies  No prescriptions prior to admission    No results found for this or any previous visit (from the past 48 hour(s)).  No results found.   Pertinent items are noted in HPI.  There were no vitals taken for this visit.  General appearance: alert Head: Normocephalic, without obvious abnormality Neck: supple, symmetrical, trachea  midline Resp: clear to auscultation bilaterally Cardio: regularly irregular rhythm GI: normal findings: bowel sounds normal Extremities: right hand reveals positive Phalen's and Tinel's. FROM of digits and wrist. NCV findings consistent with right CTS. Pulses: 2+ and symmetric Skin: normal Neurologic: Grossly normal    Assessment/Plan Impression: Right CTS  Plan: To th OR for right CTR. The procedure, risks and post-op course were discussed with the patient at length and she was in agreement with the plan.  DASNOIT,Vanessa Dean 08/07/2011, 12:43 PM    H&P documentation: 08/08/2011  -History and Physical Reviewed  -Patient has been re-examined  -No change in the plan of care  Cammie Sickle, MD

## 2011-08-08 ENCOUNTER — Encounter (HOSPITAL_BASED_OUTPATIENT_CLINIC_OR_DEPARTMENT_OTHER)
Admission: RE | Disposition: A | Discharge status: Home / Self Care | Payer: Self-pay | Source: Ambulatory Visit | Attending: Orthopedic Surgery

## 2011-08-08 ENCOUNTER — Ambulatory Visit (HOSPITAL_BASED_OUTPATIENT_CLINIC_OR_DEPARTMENT_OTHER)
Admission: RE | Admit: 2011-08-08 | Discharge: 2011-08-08 | Disposition: A | Discharge status: Home / Self Care | Payer: BC Managed Care – PPO | Source: Ambulatory Visit | Attending: Orthopedic Surgery | Admitting: Orthopedic Surgery

## 2011-08-08 ENCOUNTER — Encounter (HOSPITAL_BASED_OUTPATIENT_CLINIC_OR_DEPARTMENT_OTHER): Payer: Self-pay | Admitting: Anesthesiology

## 2011-08-08 ENCOUNTER — Ambulatory Visit (HOSPITAL_BASED_OUTPATIENT_CLINIC_OR_DEPARTMENT_OTHER): Payer: BC Managed Care – PPO | Admitting: Anesthesiology

## 2011-08-08 DIAGNOSIS — G5600 Carpal tunnel syndrome, unspecified upper limb: Secondary | ICD-10-CM | POA: Insufficient documentation

## 2011-08-08 HISTORY — DX: Snoring: R06.83

## 2011-08-08 HISTORY — DX: Localized edema: R60.0

## 2011-08-08 HISTORY — PX: CARPAL TUNNEL RELEASE: SHX101

## 2011-08-08 SURGERY — CARPAL TUNNEL RELEASE
Anesthesia: General | Site: Wrist | Laterality: Right | Wound class: Clean

## 2011-08-08 MED ORDER — FENTANYL CITRATE 0.05 MG/ML IJ SOLN
INTRAMUSCULAR | Status: DC | PRN
  Administered 2011-08-08: 100 ug via INTRAVENOUS

## 2011-08-08 MED ORDER — METOCLOPRAMIDE HCL 5 MG/ML IJ SOLN
INTRAMUSCULAR | Status: DC | PRN
  Administered 2011-08-08: 10 mg via INTRAVENOUS

## 2011-08-08 MED ORDER — HYDROCODONE-ACETAMINOPHEN 5-325 MG PO TABS
1.0000 | ORAL_TABLET | Freq: Once | ORAL | Status: AC
  Administered 2011-08-08: 1 via ORAL

## 2011-08-08 MED ORDER — LIDOCAINE HCL (CARDIAC) 20 MG/ML IV SOLN
INTRAVENOUS | Status: DC | PRN
  Administered 2011-08-08: 100 mg via INTRAVENOUS

## 2011-08-08 MED ORDER — PROPOFOL 10 MG/ML IV EMUL
INTRAVENOUS | Status: DC | PRN
  Administered 2011-08-08: 200 mg via INTRAVENOUS

## 2011-08-08 MED ORDER — HYDROMORPHONE HCL PF 1 MG/ML IJ SOLN: 0.2500 mg | INTRAMUSCULAR | Status: DC | PRN

## 2011-08-08 MED ORDER — CHLORHEXIDINE GLUCONATE 4 % EX LIQD: 60.0000 mL | Freq: Once | CUTANEOUS | Status: DC

## 2011-08-08 MED ORDER — HYDROCODONE-ACETAMINOPHEN 5-325 MG PO TABS: ORAL_TABLET | ORAL | Status: AC

## 2011-08-08 MED ORDER — LIDOCAINE HCL 2 % IJ SOLN
INTRAMUSCULAR | Status: DC | PRN
  Administered 2011-08-08: 4 mL

## 2011-08-08 MED ORDER — ONDANSETRON HCL 4 MG/2ML IJ SOLN
INTRAMUSCULAR | Status: DC | PRN
  Administered 2011-08-08: 4 mg via INTRAVENOUS

## 2011-08-08 MED ORDER — DEXAMETHASONE SODIUM PHOSPHATE 4 MG/ML IJ SOLN
INTRAMUSCULAR | Status: DC | PRN
  Administered 2011-08-08: 10 mg via INTRAVENOUS

## 2011-08-08 MED ORDER — LACTATED RINGERS IV SOLN
INTRAVENOUS | Status: DC
  Administered 2011-08-08: 08:00:00 via INTRAVENOUS

## 2011-08-08 SURGICAL SUPPLY — 38 items
BANDAGE ADHESIVE 1X3 (GAUZE/BANDAGES/DRESSINGS) IMPLANT
BANDAGE ELASTIC 3 VELCRO ST LF (GAUZE/BANDAGES/DRESSINGS) ×2 IMPLANT
BLADE SURG 15 STRL LF DISP TIS (BLADE) ×1 IMPLANT
BLADE SURG 15 STRL SS (BLADE) ×1
BNDG ESMARK 4X9 LF (GAUZE/BANDAGES/DRESSINGS) IMPLANT
BRUSH SCRUB EZ PLAIN DRY (MISCELLANEOUS) ×2 IMPLANT
CLOTH BEACON ORANGE TIMEOUT ST (SAFETY) ×2 IMPLANT
CORDS BIPOLAR (ELECTRODE) ×2 IMPLANT
COVER MAYO STAND STRL (DRAPES) ×2 IMPLANT
COVER TABLE BACK 60X90 (DRAPES) ×2 IMPLANT
CUFF TOURNIQUET SINGLE 18IN (TOURNIQUET CUFF) IMPLANT
DECANTER SPIKE VIAL GLASS SM (MISCELLANEOUS) IMPLANT
DRAPE EXTREMITY T 121X128X90 (DRAPE) ×2 IMPLANT
DRAPE SURG 17X23 STRL (DRAPES) ×2 IMPLANT
GLOVE BIO SURGEON STRL SZ 6.5 (GLOVE) ×2 IMPLANT
GLOVE BIOGEL M STRL SZ7.5 (GLOVE) ×4 IMPLANT
GLOVE BIOGEL PI IND STRL 8 (GLOVE) ×1 IMPLANT
GLOVE BIOGEL PI INDICATOR 8 (GLOVE) ×1
GLOVE ORTHO TXT STRL SZ7.5 (GLOVE) ×2 IMPLANT
GOWN PREVENTION PLUS XLARGE (GOWN DISPOSABLE) ×2 IMPLANT
GOWN PREVENTION PLUS XXLARGE (GOWN DISPOSABLE) ×6 IMPLANT
NEEDLE 27GAX1X1/2 (NEEDLE) ×2 IMPLANT
PACK BASIN DAY SURGERY FS (CUSTOM PROCEDURE TRAY) ×2 IMPLANT
PAD CAST 3X4 CTTN HI CHSV (CAST SUPPLIES) ×1 IMPLANT
PADDING CAST ABS 4INX4YD NS (CAST SUPPLIES)
PADDING CAST ABS COTTON 4X4 ST (CAST SUPPLIES) IMPLANT
PADDING CAST COTTON 3X4 STRL (CAST SUPPLIES) ×1
SPLINT PLASTER CAST XFAST 3X15 (CAST SUPPLIES) ×5 IMPLANT
SPLINT PLASTER XTRA FASTSET 3X (CAST SUPPLIES) ×5
SPONGE GAUZE 4X4 12PLY (GAUZE/BANDAGES/DRESSINGS) ×2 IMPLANT
STOCKINETTE 4X48 STRL (DRAPES) ×2 IMPLANT
STRIP CLOSURE SKIN 1/2X4 (GAUZE/BANDAGES/DRESSINGS) ×2 IMPLANT
SUT PROLENE 3 0 PS 2 (SUTURE) ×2 IMPLANT
SYR 3ML 23GX1 SAFETY (SYRINGE) IMPLANT
SYR CONTROL 10ML LL (SYRINGE) ×2 IMPLANT
TRAY DSU PREP LF (CUSTOM PROCEDURE TRAY) ×2 IMPLANT
UNDERPAD 30X30 INCONTINENT (UNDERPADS AND DIAPERS) ×2 IMPLANT
WATER STERILE IRR 1000ML POUR (IV SOLUTION) ×2 IMPLANT

## 2011-08-08 NOTE — Transfer of Care (Signed)
Immediate Anesthesia Transfer of Care Note  Patient: Vanessa Dean  Procedure(s) Performed: Procedure(s) (LRB): CARPAL TUNNEL RELEASE (Right)  Patient Location: PACU  Anesthesia Type: General  Level of Consciousness: sedated and patient cooperative  Airway & Oxygen Therapy: Patient Spontanous Breathing and Patient connected to face mask oxygen  Post-op Assessment: Report given to PACU RN and Post -op Vital signs reviewed and stable  Post vital signs: Reviewed and stable  Complications: No apparent anesthesia complications

## 2011-08-08 NOTE — Anesthesia Postprocedure Evaluation (Signed)
  Anesthesia Post-op Note  Patient: Vanessa Dean  Procedure(s) Performed: Procedure(s) (LRB): CARPAL TUNNEL RELEASE (Right)  Patient Location: PACU  Anesthesia Type: General  Level of Consciousness: awake and alert   Airway and Oxygen Therapy: Patient Spontanous Breathing  Post-op Pain: mild  Post-op Assessment: Post-op Vital signs reviewed, Patient's Cardiovascular Status Stable, Respiratory Function Stable, Patent Airway and No signs of Nausea or vomiting  Post-op Vital Signs: Reviewed and stable  Complications: No apparent anesthesia complications

## 2011-08-08 NOTE — Anesthesia Procedure Notes (Signed)
Procedure Name: LMA Insertion Date/Time: 08/08/2011 9:04 AM Performed by: Lyndee Leo Pre-anesthesia Checklist: Patient identified, Emergency Drugs available, Suction available and Patient being monitored Patient Re-evaluated:Patient Re-evaluated prior to inductionOxygen Delivery Method: Circle System Utilized Preoxygenation: Pre-oxygenation with 100% oxygen Intubation Type: IV induction Ventilation: Mask ventilation without difficulty LMA: LMA with gastric port inserted LMA Size: 4.0 Number of attempts: 1 Placement Confirmation: positive ETCO2 Tube secured with: Tape Dental Injury: Teeth and Oropharynx as per pre-operative assessment

## 2011-08-08 NOTE — Discharge Instructions (Signed)

## 2011-08-08 NOTE — Anesthesia Preprocedure Evaluation (Signed)
Anesthesia Evaluation  Patient identified by MRN, date of birth, ID band  Reviewed: Allergy & Precautions, H&P , NPO status , Patient's Chart, lab work & pertinent test results  Airway Mallampati: II TM Distance: >3 FB Neck ROM: Full    Dental No notable dental hx. (+) Teeth Intact and Dental Advisory Given   Pulmonary neg pulmonary ROS,  breath sounds clear to auscultation  Pulmonary exam normal       Cardiovascular negative cardio ROS  Rhythm:Regular Rate:Normal     Neuro/Psych negative neurological ROS  negative psych ROS   GI/Hepatic negative GI ROS, Neg liver ROS,   Endo/Other  negative endocrine ROSMorbid obesity  Renal/GU negative Renal ROS  negative genitourinary   Musculoskeletal   Abdominal (+) + obese,   Peds  Hematology negative hematology ROS (+)   Anesthesia Other Findings   Reproductive/Obstetrics negative OB ROS                           Anesthesia Physical Anesthesia Plan  ASA: III  Anesthesia Plan: General   Post-op Pain Management:    Induction: Intravenous  Airway Management Planned: LMA  Additional Equipment:   Intra-op Plan:   Post-operative Plan: Extubation in OR  Informed Consent: I have reviewed the patients History and Physical, chart, labs and discussed the procedure including the risks, benefits and alternatives for the proposed anesthesia with the patient or authorized representative who has indicated his/her understanding and acceptance.   Dental advisory given  Plan Discussed with: CRNA  Anesthesia Plan Comments:         Anesthesia Quick Evaluation

## 2011-08-08 NOTE — Op Note (Signed)
[id number] 

## 2011-08-08 NOTE — Op Note (Signed)
[name], [name]               ACCOUNT NO.:  [id number]  MEDICAL RECORD NO.:  [id number]  LOCATION:                                 FACILITY:  PHYSICIAN:  Youlanda Mighty. Leanard Dimaio, M.D.      DATE OF BIRTH:  DATE OF PROCEDURE:  08/08/2011 DATE OF DISCHARGE:                              OPERATIVE REPORT   PREOPERATIVE DIAGNOSIS:  Entrapped neuropathy, median nerve, right carpal tunnel.  POSTOPERATIVE DIAGNOSIS:  Entrapped neuropathy, median nerve, right carpal tunnel.  OPERATIONS:  Release of right transcarpal ligament.  OPERATING SURGEON:  Youlanda Mighty. Delta Pichon, MD  ASSISTANT:  Marily Lente. Dasnoit, PA-C.  ANESTHESIA:  General by LMA.  SUPERVISING ANESTHESIOLOGIST:  Soledad Gerlach, MD.  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 Gavin Pound for evaluation and management of significant hand numbness.  Clinical examination revealed signs of carpal tunnel syndrome. Electrodiagnostic studies completed by Dr. Gerald Dexter confirmed bilateral moderately severe carpal tunnel syndrome.  Due to failure to respond to nonoperative measures, she is now engaged in surgical treatment of her carpal tunnel syndrome.  She is status post release of her left transcarpal ligament without complication.  She now returns for identical surgery on the right.  Preoperatively, she was reminded of the potential risks and benefits of surgery.  Questions were invited and answered in detail.  PROCEDURE:  Vanessa Dean is brought to room 6 of the [facility], placed in supine position on the operating table.  Following a detailed anesthesia, informed consent by Dr. Ola Spurr, general anesthesia by LMA technique was recommended and accepted by Vanessa Dean.  In room 6 under Dr. Blane Ohara direct supervision, general anesthesia by LMA technique was induced followed by routine Betadine scrub and paint of the right  upper extremity.  A pneumatic tourniquet was applied to proximal right brachium.  Following routine surgical time-out, the arm was exsanguinated with Esmarch bandage and an arterial tourniquet on the proximal right brachium was inflated to 250 mmHg.  Procedure commenced with a short incision in line of the ring finger and the palm.  Subcutaneous tissues were carefully divided revealing the palmar fascia.  This split longitudinally to the common sensory branch of the median nerve and superficial palmar arch.  The distal segment of the median nerve was identified and a [place] 4 elevator was used to clear pathway between the transcarpal ligament and the median nerve proper.  We sequentially released transverse carpal ligament proximally.  Due to absolute impossibility of visualization, the incision was extended 1.5 cm proximally for safe release.  The proximal aspect of the transcarpal ligament was completely released followed by the volar forearm fascia 6 cm proximal to the distal wrist flexion crease.  This widely opened carpal canal.  The median nerve was notable for a persistent median artery and 2 veins.  The nerve was very congested beneath the transcarpal ligament.  The epineurium was gently released.  The wound was inspected for bleeding points which were electrocauterized with bipolar current followed by repair of the skin with intradermal 3-0 Prolene suture.  Compressive dressing applied with a volar plaster splint maintaining  the wrist in 10 degrees of dorsiflexion.  For aftercare, Vanessa Dean was provided prescription for Norco 5/325, 1 p.o. q.4-6 hours p.r.n. pain, 20 tablets without refill.  We will see her back for followup in our office in 7-8 days for suture removal.     Youlanda Mighty. Takari Lundahl, M.D.     RVS/MEDQ  D:  08/08/2011  T:  08/08/2011  Job:  [id number]

## 2011-08-08 NOTE — Brief Op Note (Signed)
08/08/2011  9:34 AM  PATIENT:  Vanessa Dean  46 y.o. female  PRE-OPERATIVE DIAGNOSIS:  right carpal tunnel syndrome  POST-OPERATIVE DIAGNOSIS:  right carpal tunnel syndrome  PROCEDURE:   CARPAL TUNNEL RELEASE (Right)  SURGEON:  Cammie Sickle., MD   PHYSICIAN ASSISTANT:   ASSISTANTS: Kathyrn Sheriff.A-C   ANESTHESIA:   general  EBL:     BLOOD ADMINISTERED:none  DRAINS: none   LOCAL MEDICATIONS USED:  LIDOCAINE   SPECIMEN:  No Specimen  DISPOSITION OF SPECIMEN:  N/A  COUNTS:  YES  TOURNIQUET:  * Missing tourniquet times found for documented tourniquets in log:  [id number] *  DICTATION: .Other Dictation: Dictation Number [id number]  PLAN OF CARE: Discharge to home after PACU  PATIENT DISPOSITION:  PACU - hemodynamically stable.

## 2011-08-10 ENCOUNTER — Encounter (HOSPITAL_BASED_OUTPATIENT_CLINIC_OR_DEPARTMENT_OTHER): Payer: Self-pay | Admitting: Orthopedic Surgery

## 2011-10-06 ENCOUNTER — Other Ambulatory Visit: Payer: Self-pay | Admitting: Obstetrics and Gynecology

## 2011-11-01 ENCOUNTER — Other Ambulatory Visit: Payer: Self-pay | Admitting: Obstetrics and Gynecology

## 2011-11-01 DIAGNOSIS — N6312 Unspecified lump in the right breast, upper inner quadrant: Secondary | ICD-10-CM

## 2011-11-03 ENCOUNTER — Ambulatory Visit
Admission: RE | Admit: 2011-11-03 | Discharge: 2011-11-03 | Disposition: A | Discharge status: Home / Self Care | Payer: BC Managed Care – PPO | Source: Ambulatory Visit | Attending: Obstetrics and Gynecology | Admitting: Obstetrics and Gynecology

## 2011-11-03 DIAGNOSIS — N6312 Unspecified lump in the right breast, upper inner quadrant: Secondary | ICD-10-CM

## 2012-01-17 ENCOUNTER — Other Ambulatory Visit: Payer: Self-pay | Admitting: Family Medicine

## 2012-01-17 ENCOUNTER — Ambulatory Visit
Admission: RE | Admit: 2012-01-17 | Discharge: 2012-01-17 | Disposition: A | Discharge status: Home / Self Care | Payer: BC Managed Care – PPO | Source: Ambulatory Visit | Attending: Family Medicine | Admitting: Family Medicine

## 2012-01-17 ENCOUNTER — Other Ambulatory Visit: Payer: Self-pay | Admitting: *Deleted

## 2012-01-17 DIAGNOSIS — R0600 Dyspnea, unspecified: Secondary | ICD-10-CM

## 2012-02-05 ENCOUNTER — Ambulatory Visit (INDEPENDENT_AMBULATORY_CARE_PROVIDER_SITE_OTHER): Payer: BC Managed Care – PPO | Admitting: Family Medicine

## 2012-02-05 ENCOUNTER — Ambulatory Visit: Payer: BC Managed Care – PPO

## 2012-02-05 VITALS — BP 143/92 | HR 89 | Temp 98.1°F | Resp 18 | Ht 65.5 in | Wt 298.2 lb

## 2012-02-05 DIAGNOSIS — M542 Cervicalgia: Secondary | ICD-10-CM

## 2012-02-05 DIAGNOSIS — S139XXA Sprain of joints and ligaments of unspecified parts of neck, initial encounter: Secondary | ICD-10-CM

## 2012-02-05 DIAGNOSIS — S161XXA Strain of muscle, fascia and tendon at neck level, initial encounter: Secondary | ICD-10-CM

## 2012-02-05 MED ORDER — DICLOFENAC SODIUM 75 MG PO TBEC: 75.0000 mg | DELAYED_RELEASE_TABLET | Freq: Two times a day (BID) | ORAL | Status: DC

## 2012-02-05 NOTE — Patient Instructions (Signed)
Apply heat as necessary for relief  Diclofenac twice daily  Return if worse or not improving

## 2012-02-05 NOTE — Progress Notes (Signed)
Subjective: Patient was bowling 2 weeks ago when she tripped and fell forward, mostly on an extended right arm and partially extended left arm. She hit her knee and had most of her initial pain in the knee and thigh. Several days later she started having neck pain. She is persisted with having cervical pain in the lower neck and back and to the left of the neck. She wonders whether she could have damaged a disc.  Objective: Satisfactory range of motion of the neck. She is tender in the C. 5/6 range of her neck, and the left of the spine. Motor strength is good. No radiculopathy. Sensory grossly intact.  Assessment: Cervical pain  Plan: C-spine series and decide if further treatment is needed.  [facility] reading (PRIMARY) by  Dr. Linna Darner Normal cervical spine.

## 2012-09-11 ENCOUNTER — Ambulatory Visit: Payer: BC Managed Care – PPO

## 2012-09-11 ENCOUNTER — Ambulatory Visit (INDEPENDENT_AMBULATORY_CARE_PROVIDER_SITE_OTHER): Payer: BC Managed Care – PPO | Admitting: Family Medicine

## 2012-09-11 VITALS — BP 150/100 | HR 94 | Temp 98.0°F | Resp 16 | Ht 65.0 in | Wt 305.0 lb

## 2012-09-11 DIAGNOSIS — M25519 Pain in unspecified shoulder: Secondary | ICD-10-CM

## 2012-09-11 DIAGNOSIS — S46011A Strain of muscle(s) and tendon(s) of the rotator cuff of right shoulder, initial encounter: Secondary | ICD-10-CM

## 2012-09-11 DIAGNOSIS — M25511 Pain in right shoulder: Secondary | ICD-10-CM

## 2012-09-11 DIAGNOSIS — S43429A Sprain of unspecified rotator cuff capsule, initial encounter: Secondary | ICD-10-CM

## 2012-09-11 DIAGNOSIS — M62838 Other muscle spasm: Secondary | ICD-10-CM

## 2012-09-11 MED ORDER — PREDNISONE 10 MG PO TABS: ORAL_TABLET | ORAL | Status: DC

## 2012-09-11 MED ORDER — TRAMADOL HCL 50 MG PO TABS: 50.0000 mg | ORAL_TABLET | Freq: Three times a day (TID) | ORAL | Status: DC | PRN

## 2012-09-11 NOTE — Patient Instructions (Signed)
Start the prednisone on Friday.  When you finish it the course, the following day, start taking the naproxyn with breakfast and dinner daily.  Start applying 15 minutes of heat followed by gentle stretching 2 - 3 times a day. If you would like to take a muscle relaxant before you do this - especially at night - it may help as well. If you are still having pain in 6 wks, come back to clinic for further eval as we may need to consider additional imaging (MRI) and/or specialist referral. RTC immed if symptoms worsen or you develop numbness or weakness in the arm.   Shoulder Range of Motion Exercises The shoulder is the most flexible joint in the human body. Because of this it is also the most unstable joint in the body. All ages can develop shoulder problems. Early treatment of problems is necessary for a good outcome. People react to shoulder pain by decreasing the movement of the joint. After a brief period of time, the shoulder can become "frozen". This is an almost complete loss of the ability to move the damaged shoulder. Following injuries your caregivers can give you instructions on exercises to keep your range of motion (ability to move your shoulder freely), or regain it if it has been lost.  [place]: Codman's Exercise or Pendulum Exercise  This exercise may be performed in a prone (face-down) lying position or standing while leaning on a chair with the opposite arm. Its purpose is to relax the muscles in your shoulder and slowly but surely increase the range of motion and to relieve pain.  Lie on your stomach close to the side edge of the bed. Let your weak arm hang over the edge of the bed. Relax your shoulder, arm and hand. Let your shoulder blade relax and drop down.  Slowly and gently swing your arm forward and back. Do not use your neck muscles; relax them. It might be easier to have someone else gently start swinging your arm.  As pain  decreases, increase your swing. To start, arm swing should begin at 15 degree angles. In time and as pain lessens, move to 30-45 degree angles. Start with swinging for about 15 seconds, and work towards swinging for 3 to 5 minutes.  This exercise may also be performed in a standing/bent over position.  Stand and hold onto a sturdy chair with your good arm. Bend forward at the waist and bend your knees slightly to help protect your back. Relax your weak arm, let it hang limp. Relax your shoulder blade and let it drop.  Keep your shoulder relaxed and use body motion to swing your arm in small circles.  Stand up tall and relax.  Repeat motion and change direction of circles.  Start with swinging for about 30 seconds, and work towards swinging for 3 to 5 minutes. STRETCHING EXERCISES:  Lift your arm out in front of you with the elbow bent at 90 degrees. Using your other arm gently pull the elbow forward and across your body.  Bend one arm behind you with the palm facing outward. Using the other arm, hold a towel or rope and reach this arm up above your head, then bend it at the elbow to move your wrist to behind your neck. Grab the free end of the towel with the hand behind your back. Gently pull the towel up with the hand behind your neck, gradually increasing the pull on the  hand behind the small of your back. Then, gradually pull down with the hand behind the small of your back. This will pull the hand and arm behind your neck further. Both shoulders will have an increased range of motion with repetition of this exercise. STRENGTHENING EXERCISES:  Standing with your arm at your side and straight out from your shoulder with the elbow bent at 90 degrees, hold onto a small weight and slowly raise your hand so it points straight up in the air. Repeat this five times to begin with, and gradually increase to ten times. Do this four times per day. As you grow stronger you can gradually increase the  weight.  Repeat the above exercise, only this time using an elastic band. Start with your hand up in the air and pull down until your hand is by your side. As you grow stronger, gradually increase the amount you pull by increasing the number or size of the elastic bands. Use the same amount of repetitions.  Standing with your hand at your side and holding onto a weight, gradually lift the hand in front of you until it is over your head. Do the same also with the hand remaining at your side and lift the hand away from your body until it is again over your head. Repeat this five times to begin with, and gradually increase to ten times. Do this four times per day. As you grow stronger you can gradually increase the weight. Document Released: 11/19/2002 Document Revised: 05/15/2011 Document Reviewed: 02/20/2005 [facility] Patient Information 2014 [place].

## 2012-09-11 NOTE — Progress Notes (Signed)
Subjective:    Patient ID: Vanessa Dean, female    DOB: 09/15/1965, 47 y.o.   MRN: [id number] Chief Complaint  Patient presents with  . Shoulder Pain    right shoulder x 1 week    HPI  Has had intermittent right upper shoulder and neck pain for months - was seen here for neck pain in Dec.  Since has been intermittent but worsening, esp in the past wk. Saw PCP Dr. Harlan Stains, [place], with similar pains and given muscle relaxant.  For the past wk has had severe upper right shoulder pain near neck with hard knot and warmth - thought it might be coming from her ears as radiaitng up her neck at times so was seen on Sat at [facility] walk-in clinic and is now on amoxicillin - but unfortunately the pain is actually getting worse and seems to be traveling to the front and behind her shoulder.  Her eye was red as well and she is using an antibacterial drop - the eye redness has completely resolved. Has no idea what triggers flairs of her shoulder and upper back pain.  Has used heat, muscle relaxant - which did help her feel more relaxed but didn't seem to affect the pain and swelling.  Tried some tylenol - unsure if any benefit.  Has tried aleve which did help a little.  Worse when she sleeps on it   Worse when she tries to turn her neck over her right shoulder and extending arm.   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  Past Medical History  Diagnosis Date  . Arthritis   . Seasonal allergies     TAKES ZYRTEC AS NEEDED  . Fluid retention in legs   . Snores   . Anemia    Current Outpatient Prescriptions on File Prior to Visit  Medication Sig Dispense Refill  . cetirizine (ZYRTEC) 10 MG tablet Take 10 mg by mouth as needed.      . diclofenac (VOLTAREN) 75 MG EC tablet Take 1 tablet (75 mg total) by mouth 2 (two) times daily.  30 tablet  0  . hydrochlorothiazide (MICROZIDE) 12.5 MG capsule Take 12.5 mg by mouth as needed. TAKES AS NEEDED FOR BLOATING      . meloxicam (MOBIC) 15  MG tablet Take 15 mg by mouth as needed.      . Vitamin D, Ergocalciferol, ([name]) 50000 UNITS CAPS Take 50,000 Units by mouth 2 (two) times a week.       No current facility-administered medications on file prior to visit.   No Known Allergies  Review of Systems  Constitutional: Negative for fever, chills, activity change and unexpected weight change.  HENT: Positive for ear pain, neck pain and neck stiffness.   Eyes: Negative for discharge and redness.  Musculoskeletal: Positive for myalgias, back pain and gait problem. Negative for joint swelling and arthralgias.  Skin: Negative for color change and rash.  Neurological: Negative for weakness and numbness.  Psychiatric/Behavioral: Positive for sleep disturbance.      BP 150/100  Pulse 94  Temp(Src) 98 F (36.7 C) (Oral)  Resp 16  Ht 5' 5"  (1.651 m)  Wt 305 lb (138.347 kg)  BMI 50.75 kg/m2  SpO2 98% Objective:   Physical Exam  Constitutional: She is oriented to person, place, and time. She appears well-developed and well-nourished. No distress.  HENT:  Head: Normocephalic and atraumatic.  Right Ear: External ear and ear canal normal. Tympanic membrane is retracted.  Left  Ear: Ear canal normal. Tympanic membrane is retracted.  Eyes: Conjunctivae are normal. No scleral icterus.  Pulmonary/Chest: Effort normal.  Musculoskeletal:       Right shoulder: She exhibits tenderness, bony tenderness and decreased strength. She exhibits normal range of motion, no swelling and no effusion.       Cervical back: She exhibits decreased range of motion.       Thoracic back: She exhibits decreased range of motion, tenderness and bony tenderness.  No deficits in abduction or ROM in shoulder tenderness over right AC joint and acromion tip Tenderness over C7-T1 area and upper thoracic Reduced ROM with chin over left shoulder Weakness in right rotator cuff testing  Neurological: She is alert and oriented to person, place, and time.  Skin:  Skin is warm and dry. She is not diaphoretic. No erythema.  Psychiatric: She has a normal mood and affect. Her behavior is normal.     [facility] reading (PRIMARY) by  Dr. Brigitte Pulse. Right shoulder xray: no acute abnormality Thoracic spine xray: no acute abnormality - perhaps some slight disc space narrowing in upper thoracic spine but difficult to visualize on films. Assessment & Plan:  Right shoulder pain - Plan: DG Thoracic Spine 2 View, DG Shoulder Right  Muscle spasm - Plan: Ambulatory referral to Physical Therapy - Informed consent obtained. did 2 trigger point injections into right trapezius with 18m of DepoMedrol in each to area of greatest pain - fanned out after cleaning x 4 with alcohol and sterile procedure followed - 1 medial to mid scapula and other medial to upper scapula.  See pt instructions - start pred dose pack in 2d, do freq heat and gentle stretching. Start PT. RTC in 6 wks if pain cont.  Rotator cuff strain, right, initial encounter - Plan: Ambulatory referral to Physical Therapy  Meds ordered this encounter  Medications  . predniSONE (DELTASONE) 10 MG tablet    Sig: Take 6 tabs po d1, 5 tabs po d2, 4 tabs d3, 3 tabs d4, 2 tabs d5, 1 tab d6    Dispense:  21 tablet    Refill:  0  . traMADol (ULTRAM) 50 MG tablet    Sig: Take 1 tablet (50 mg total) by mouth every 8 (eight) hours as needed for pain.    Dispense:  30 tablet    Refill:  1

## 2012-12-07 ENCOUNTER — Ambulatory Visit (INDEPENDENT_AMBULATORY_CARE_PROVIDER_SITE_OTHER): Payer: BC Managed Care – PPO | Admitting: Family Medicine

## 2012-12-07 VITALS — BP 150/82 | HR 107 | Temp 99.1°F | Resp 16 | Ht 65.5 in | Wt 306.4 lb

## 2012-12-07 DIAGNOSIS — IMO0001 Reserved for inherently not codable concepts without codable children: Secondary | ICD-10-CM

## 2012-12-07 DIAGNOSIS — I10 Essential (primary) hypertension: Secondary | ICD-10-CM

## 2012-12-07 DIAGNOSIS — M62838 Other muscle spasm: Secondary | ICD-10-CM

## 2012-12-07 DIAGNOSIS — S29019S Strain of muscle and tendon of unspecified wall of thorax, sequela: Secondary | ICD-10-CM

## 2012-12-07 MED ORDER — PREDNISONE 20 MG PO TABS: ORAL_TABLET | ORAL | Status: DC

## 2012-12-07 NOTE — Progress Notes (Addendum)
Subjective:    Patient ID: Vanessa Dean, female    DOB: September 01, 1965, 47 y.o.   MRN: [id number]  HPI Chief Complaint  Patient presents with  . rt.shoulder pain    into neck  . left hand    twitching    HPI Comments: Vanessa Dean is a 47 y.o. female who presents to the [facility]  complaining of severe right shoulder pain. Pain worsens when moving. Pain is radiating to neck and spine.  For two days, left two fingers were twitching.  Trigger point Injections that we tried 3 mos ago did seem to temporarily help relieve shoulder pain. Physical therapy helped for a few months - went to [facility] for about 3 wks till released. Recently, pain worsened last week.  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 Past two nights, took muscle relaxer and tylenol with mild temporary relief.   Current Outpatient Prescriptions on File Prior to Visit  Medication Sig Dispense Refill  . diclofenac (VOLTAREN) 75 MG EC tablet Take 1 tablet (75 mg total) by mouth 2 (two) times daily.  30 tablet  0  . cetirizine (ZYRTEC) 10 MG tablet Take 10 mg by mouth as needed.      . hydrochlorothiazide (MICROZIDE) 12.5 MG capsule Take 12.5 mg by mouth as needed. TAKES AS NEEDED FOR BLOATING      . meloxicam (MOBIC) 15 MG tablet Take 15 mg by mouth as needed.      . predniSONE (DELTASONE) 10 MG tablet Take 6 tabs po d1, 5 tabs po d2, 4 tabs d3, 3 tabs d4, 2 tabs d5, 1 tab d6  21 tablet  0  . traMADol (ULTRAM) 50 MG tablet Take 1 tablet (50 mg total) by mouth every 8 (eight) hours as needed for pain.  30 tablet  1  . Vitamin D, Ergocalciferol, ([name]) 50000 UNITS CAPS Take 50,000 Units by mouth 2 (two) times a week.       No current facility-administered medications on file prior to visit.    Allergies  Allergen Reactions  . Hydrocodeine [Dihydrocodeine] Itching    Past  Medical History  Diagnosis Date  . Arthritis   . Seasonal allergies     TAKES ZYRTEC AS NEEDED  . Fluid retention in legs   . Snores   . Anemia     History   Social History  . Marital Status: Married    Spouse Name: N/A    Number of Children: N/A  . Years of Education: N/A   Occupational History  . Not on file.   Social History Main Topics  . Smoking status: Never Smoker   . Smokeless tobacco: Not on file  . Alcohol Use: Yes     Comment: SOCIAL  . Drug Use: No  . Sexual Activity: Yes   Other Topics Concern  . Not on file   Social History Narrative  . No narrative on file    Review of Systems  Constitutional: Positive for activity change. Negative for fever, chills and unexpected weight change.  Musculoskeletal: Positive for myalgias, back pain and arthralgias. Negative for joint swelling and gait problem.  Skin: Negative for color change and rash.  Neurological: Negative for weakness and numbness.  Psychiatric/Behavioral: Positive for sleep disturbance.   BP 150/82  Pulse 107  Temp(Src) 99.1 F (37.3 C) (Oral)  Resp 16  Ht 5' 5.5" (1.664 m)  Wt 306 lb 6.4 oz (138.982 kg)  BMI 50.19 kg/m2  SpO2 97%    Objective:   Physical Exam  Constitutional: She is oriented to person, place, and time. She appears well-developed and well-nourished. No distress.  HENT:  Head: Normocephalic and atraumatic.  Right Ear: External ear normal.  Left Ear: External ear normal.  Eyes: Conjunctivae are normal. No scleral icterus.  Neck: Normal range of motion. Neck supple. No thyromegaly present.  Cardiovascular: Normal rate, regular rhythm, normal heart sounds and intact distal pulses.   Pulmonary/Chest: Effort normal and breath sounds normal. No respiratory distress.  Musculoskeletal: Normal range of motion. She exhibits tenderness. She exhibits no edema.       Right shoulder: She exhibits tenderness, bony tenderness, spasm and decreased strength. She exhibits normal range of  motion, no swelling and no effusion.       Cervical back: She exhibits tenderness and bony tenderness. She exhibits normal range of motion and no spasm.       Thoracic back: She exhibits tenderness and spasm. She exhibits normal range of motion and no bony tenderness.  Tnderness over right AC joint and acromion tip Tenderness over C7-T1 area and upper thoracic spinous process. spasms in left trapezius palpable Weakness in right rotator cuff testing   Lymphadenopathy:    She has no cervical adenopathy.  Neurological: She is alert and oriented to person, place, and time.  Skin: Skin is warm and dry. She is not diaphoretic. No erythema.  Psychiatric: She has a normal mood and affect. Her behavior is normal.      Assessment & Plan:  Trapezius muscle spasm - Plan: Ambulatory referral to Physical Therapy, predniSONE (DELTASONE) 20 MG tablet  Essential hypertension, benign - did not discuss today - repeatedly elev at all visits- though usu seen for pain.  Needs recheck at f/u.  Thoracic myofascial strain, sequela - Plan: Ambulatory referral to Physical Therapy, predniSONE (DELTASONE) 20 MG tablet - reviewed c-spine xray from Dec 2013 and t-spine and rt shoulder xrays from July 2014.  Pt had temporary improvement for sev mos after course of prednisone, trigger point injections, and PT however, now sxs have recurred in totality.  Suspect pt stores her stress in her trap/rhomboid and is going to be continually prone to developing muscle spasms here - refer to integrative therapies for additional PT, manual myofasical work, and massage - pt will likely need to continue this indefinitely on a routine basis to keep pain away. If does not work - consider referral to sports med.  Meds ordered this encounter  Medications  . DISCONTD: predniSONE (DELTASONE) 20 MG tablet    Sig: Take 3 tabs po qd x 3d, then 2 tabs po qd x 3d, then 1 tab po qd x 3d, then 1/2 tab po qd 2d    Dispense:  19 tablet    Refill:  0   . predniSONE (DELTASONE) 20 MG tablet    Sig: Take 3 tabs po qd x 3d, then 2 tabs po qd x 3d, then 1 tab po qd x 3d, then 1/2 tab po qd 2d    Dispense:  19 tablet    Refill:  0

## 2013-12-26 ENCOUNTER — Other Ambulatory Visit: Payer: Self-pay | Admitting: Obstetrics and Gynecology

## 2013-12-29 LAB — CYTOLOGY - PAP

## 2014-03-18 ENCOUNTER — Encounter: Payer: BC Managed Care – PPO | Attending: Family Medicine | Admitting: Dietician

## 2014-03-18 DIAGNOSIS — Z713 Dietary counseling and surveillance: Secondary | ICD-10-CM | POA: Diagnosis not present

## 2014-03-18 DIAGNOSIS — E119 Type 2 diabetes mellitus without complications: Secondary | ICD-10-CM | POA: Diagnosis present

## 2014-03-18 NOTE — Progress Notes (Signed)
Diabetes Self-Management Education  Visit Type:    Appt. Start Time: 0830 Appt. End Time: 1000  03/18/2014  [name], identified by name and date of birth, is a 49 y.o. female with a diagnosis of Diabetes: Type 2.  Other people present during visit:  Patient   ASSESSMENT  There were no vitals taken for this visit. There is no weight on file to calculate BMI.  Initial Visit Information:  Are you currently following a meal plan?: No   Are you taking your medications as prescribed?: Yes          Psychosocial:     Patient Belief/Attitude about Diabetes: Motivated to manage diabetes Self-care barriers: Unable to determine Self-management support: Family, Friends Other persons present: Patient Patient Concerns: Nutrition/Meal planning, Healthy Lifestyle, Weight Control Preferred Learning Style: Visual, Auditory Learning Readiness: Ready  Complications:   How often do you check your blood sugar?: 1-2 times/day Fasting Blood glucose range (mg/dL): 70-129 Number of hypoglycemic episodes per month: 0 Number of hyperglycemic episodes per week: 1 Can you tell when your blood sugar is high?: No Have you had a dilated eye exam in the past 12 months?: Yes Have you had a dental exam in the past 12 months?: Yes  Diet Intake:  Breakfast: 7:30-8am: egg white and [place] and lettuce on flatbread with water or 100% juice Lunch: sometimes skips due to work schedule; 3pm tuna on whole wheat bagel with lettuce and tomato Dinner: salad or chicken sandwich from [place] with diet soda or water Snack (evening): popcorn, banana or apple or orange, no sugar added Klondike bar, veggie straws Beverage(s): water, 100% juice, diet soda, occasionally coffee with milk and sugar or Stevia drops, occasionally sweet tea, rarely wine  Exercise:  Exercise: ADL's  Individualized Plan for Diabetes Self-Management Training:   Learning Objective:  Patient will have a greater understanding of  diabetes self-management.  Patient education plan per assessed needs and concerns is to attend individual sessions for     Education Topics Reviewed with Patient Today:  Factors that contribute to the development of diabetes, Definition of diabetes, type 1 and 2, and the diagnosis of diabetes Role of diet in the treatment of diabetes and the relationship between the three main macronutrients and blood glucose level, Food label reading, portion sizes and measuring food., Carbohydrate counting, Meal options for control of blood glucose level and chronic complications. Role of exercise on diabetes management, blood pressure control and cardiac health., Helped patient identify appropriate exercises in relation to his/her diabetes, diabetes complications and other health issue.         Role of stress on diabetes, Identified and addressed patients feelings and concerns about diabetes      PATIENTS GOALS/Plan (Developed by the patient):  Nutrition: Follow meal plan discussed, General guidelines for healthy choices and portions discussed Physical Activity: 30 minutes per day Medications: take my medication as prescribed Reducing Risk: Not Applicable Health Coping: Not Applicable  Plan:   There are no Patient Instructions on file for this visit.  Expected Outcomes:  Demonstrated interest in learning. Expect positive outcomes  Education material provided: Living Well with Diabetes, Meal plan card, My Plate and Snack sheet  If problems or questions, patient to contact team via:  Phone  Future DSME appointment: PRN, 3-4 months

## 2014-12-08 ENCOUNTER — Other Ambulatory Visit: Payer: Self-pay | Admitting: Obstetrics and Gynecology

## 2014-12-08 DIAGNOSIS — N63 Unspecified lump in unspecified breast: Secondary | ICD-10-CM

## 2014-12-11 ENCOUNTER — Ambulatory Visit
Admission: RE | Admit: 2014-12-11 | Discharge: 2014-12-11 | Disposition: A | Discharge status: Home / Self Care | Payer: BC Managed Care – PPO | Source: Ambulatory Visit | Attending: Obstetrics and Gynecology | Admitting: Obstetrics and Gynecology

## 2014-12-11 DIAGNOSIS — N63 Unspecified lump in unspecified breast: Secondary | ICD-10-CM

## 2015-02-25 ENCOUNTER — Other Ambulatory Visit: Payer: Self-pay | Admitting: Surgical Oncology

## 2015-02-25 DIAGNOSIS — I10 Essential (primary) hypertension: Secondary | ICD-10-CM

## 2015-02-25 DIAGNOSIS — G4733 Obstructive sleep apnea (adult) (pediatric): Secondary | ICD-10-CM

## 2015-02-25 DIAGNOSIS — E669 Obesity, unspecified: Secondary | ICD-10-CM

## 2015-02-25 DIAGNOSIS — E1169 Type 2 diabetes mellitus with other specified complication: Secondary | ICD-10-CM

## 2015-02-25 DIAGNOSIS — E7849 Other hyperlipidemia: Secondary | ICD-10-CM

## 2015-04-22 ENCOUNTER — Other Ambulatory Visit: Payer: Self-pay | Admitting: Surgical Oncology

## 2015-04-22 DIAGNOSIS — I10 Essential (primary) hypertension: Secondary | ICD-10-CM

## 2015-04-22 DIAGNOSIS — G4733 Obstructive sleep apnea (adult) (pediatric): Secondary | ICD-10-CM

## 2015-04-22 DIAGNOSIS — E669 Obesity, unspecified: Secondary | ICD-10-CM

## 2015-04-22 DIAGNOSIS — E1169 Type 2 diabetes mellitus with other specified complication: Secondary | ICD-10-CM

## 2015-05-07 ENCOUNTER — Ambulatory Visit
Admission: RE | Admit: 2015-05-07 | Discharge: 2015-05-07 | Disposition: A | Discharge status: Home / Self Care | Payer: BC Managed Care – PPO | Source: Ambulatory Visit | Attending: Surgical Oncology | Admitting: Surgical Oncology

## 2015-05-07 DIAGNOSIS — E1169 Type 2 diabetes mellitus with other specified complication: Secondary | ICD-10-CM

## 2015-05-07 DIAGNOSIS — E669 Obesity, unspecified: Secondary | ICD-10-CM

## 2015-05-07 DIAGNOSIS — G4733 Obstructive sleep apnea (adult) (pediatric): Secondary | ICD-10-CM

## 2015-05-07 DIAGNOSIS — I10 Essential (primary) hypertension: Secondary | ICD-10-CM

## 2015-05-14 ENCOUNTER — Other Ambulatory Visit: Payer: Self-pay | Admitting: Family Medicine

## 2015-05-14 DIAGNOSIS — R1031 Right lower quadrant pain: Secondary | ICD-10-CM

## 2015-05-28 ENCOUNTER — Ambulatory Visit
Admission: RE | Admit: 2015-05-28 | Discharge: 2015-05-28 | Disposition: A | Discharge status: Home / Self Care | Payer: BC Managed Care – PPO | Source: Ambulatory Visit | Attending: Family Medicine | Admitting: Family Medicine

## 2015-05-28 DIAGNOSIS — R1031 Right lower quadrant pain: Secondary | ICD-10-CM

## 2015-06-01 ENCOUNTER — Other Ambulatory Visit: Payer: Self-pay | Admitting: Family Medicine

## 2015-06-01 DIAGNOSIS — R1031 Right lower quadrant pain: Secondary | ICD-10-CM

## 2015-06-09 ENCOUNTER — Ambulatory Visit
Admission: RE | Admit: 2015-06-09 | Discharge: 2015-06-09 | Disposition: A | Discharge status: Home / Self Care | Payer: BC Managed Care – PPO | Source: Ambulatory Visit | Attending: Family Medicine | Admitting: Family Medicine

## 2015-06-09 DIAGNOSIS — R1031 Right lower quadrant pain: Secondary | ICD-10-CM

## 2015-06-09 MED ORDER — IOPAMIDOL (ISOVUE-300) INJECTION 61%
125.0000 mL | Freq: Once | INTRAVENOUS | Status: AC | PRN
  Administered 2015-06-09: 125 mL via INTRAVENOUS

## 2015-08-10 ENCOUNTER — Other Ambulatory Visit: Payer: Self-pay | Admitting: Gastroenterology

## 2017-10-23 NOTE — Pre-Procedure Instructions (Addendum)
Vanessa Dean  10/23/2017      [id number] [place] [facility] [address]Phone: [phone]: [phone] [facility] [address]Phone: [phone]: [phone]   Your procedure is scheduled on August 28  [place]  Call this number if you have problems the morning of surgery:  [phone]   Remember:  Do not eat or drink after midnight.   Bring mask and tubing for CPAP the morning of surgery    Take these medicines the morning of surgery with A SIP OF WATER  HYDROcodone-acetaminophen (NORCO/VICODIN)   7 days prior to surgery STOP taking any Aspirin(unless otherwise instructed by your surgeon), Aleve, Naproxen, Ibuprofen, Motrin, Advil, Goody's, BC's, all herbal medications, fish oil, and all vitamins,  meloxicam (MOBIC), diclofenac (VOLTAREN)     Do not wear jewelry, make-up or nail polish.  Do not wear lotions, powders, or perfumes, or deodorant.  Do not shave 48 hours prior to surgery.   Do not bring valuables to the hospital.  [facility] any belongings or [facility]ables.  Contacts, dentures or bridgework may not be worn into surgery.  Leave your suitcase in the car.  After surgery it may be brought to your room.  For patients admitted to the hospital, discharge time will be determined by your treatment team.  Patients discharged the day of surgery will not be allowed to drive home.    Special instructions:   [place]- Preparing For Surgery  Before surgery, you can play an important role. Because skin is not sterile, your skin needs to be as free of germs as possible. You can reduce the number of germs on your skin by washing with CHG (chlorahexidine gluconate) Soap before surgery.   CHG is an antiseptic cleaner which kills germs and bonds with the skin to continue killing germs even after washing.    Oral Hygiene is also important to reduce your risk of infection.  Remember - BRUSH YOUR TEETH THE MORNING OF SURGERY WITH YOUR REGULAR TOOTHPASTE  Please do not use if you have an allergy to CHG or antibacterial soaps. If your skin becomes reddened/irritated stop using the CHG.  Do not shave (including legs and underarms) for at least 48 hours prior to first CHG shower. It is OK to shave your face.  Please follow these instructions carefully.   1. Shower the NIGHT BEFORE SURGERY and the MORNING OF SURGERY with CHG.   2. If you chose to wash your hair, wash your hair first as usual with your normal shampoo.  3. After you shampoo, rinse your hair and body thoroughly to remove the shampoo.  4. Use CHG as you would any other liquid soap. You can apply CHG directly to the skin and wash gently with a scrungie or a clean washcloth.   5. Apply the CHG Soap to your body ONLY FROM THE NECK DOWN.  Do not use on open wounds or open sores. Avoid contact with your eyes, ears, mouth and genitals (private parts). Wash Face and genitals (private parts)  with your normal soap.  6. Wash thoroughly, paying special attention to the area where your surgery will be performed.  7. Thoroughly rinse your body with  warm water from the neck down.  8. DO NOT shower/wash with your normal soap after using and rinsing off the CHG Soap.  9. Pat yourself dry with a CLEAN TOWEL.  10. Wear CLEAN PAJAMAS to bed the night before surgery, wear comfortable clothes the morning of surgery  11. Place CLEAN SHEETS on your bed the night of your first shower and DO NOT SLEEP WITH PETS.    Day of Surgery:  Do not apply any deodorants/lotions.  Please wear clean clothes to the hospital/surgery center.   Remember to brush your teeth WITH YOUR REGULAR TOOTHPASTE.    Please read over the following fact  sheets that you were given.

## 2017-10-24 ENCOUNTER — Other Ambulatory Visit: Payer: Self-pay

## 2017-10-24 ENCOUNTER — Encounter (HOSPITAL_COMMUNITY)
Admission: RE | Admit: 2017-10-24 | Discharge: 2017-10-24 | Disposition: A | Discharge status: Home / Self Care | Payer: BC Managed Care – PPO | Source: Ambulatory Visit | Attending: Orthopedic Surgery | Admitting: Orthopedic Surgery

## 2017-10-24 ENCOUNTER — Encounter (HOSPITAL_COMMUNITY): Payer: Self-pay

## 2017-10-24 DIAGNOSIS — G4733 Obstructive sleep apnea (adult) (pediatric): Secondary | ICD-10-CM | POA: Diagnosis not present

## 2017-10-24 DIAGNOSIS — Z01818 Encounter for other preprocedural examination: Secondary | ICD-10-CM | POA: Diagnosis not present

## 2017-10-24 DIAGNOSIS — Z90710 Acquired absence of both cervix and uterus: Secondary | ICD-10-CM | POA: Diagnosis not present

## 2017-10-24 DIAGNOSIS — K5090 Crohn's disease, unspecified, without complications: Secondary | ICD-10-CM | POA: Diagnosis not present

## 2017-10-24 DIAGNOSIS — Z791 Long term (current) use of non-steroidal anti-inflammatories (NSAID): Secondary | ICD-10-CM | POA: Insufficient documentation

## 2017-10-24 DIAGNOSIS — Z9884 Bariatric surgery status: Secondary | ICD-10-CM | POA: Diagnosis not present

## 2017-10-24 DIAGNOSIS — Z01812 Encounter for preprocedural laboratory examination: Secondary | ICD-10-CM | POA: Diagnosis present

## 2017-10-24 DIAGNOSIS — Z79899 Other long term (current) drug therapy: Secondary | ICD-10-CM | POA: Insufficient documentation

## 2017-10-24 DIAGNOSIS — M4316 Spondylolisthesis, lumbar region: Secondary | ICD-10-CM | POA: Diagnosis not present

## 2017-10-24 DIAGNOSIS — Z9049 Acquired absence of other specified parts of digestive tract: Secondary | ICD-10-CM | POA: Insufficient documentation

## 2017-10-24 DIAGNOSIS — D649 Anemia, unspecified: Secondary | ICD-10-CM | POA: Insufficient documentation

## 2017-10-24 DIAGNOSIS — I10 Essential (primary) hypertension: Secondary | ICD-10-CM | POA: Insufficient documentation

## 2017-10-24 DIAGNOSIS — M48061 Spinal stenosis, lumbar region without neurogenic claudication: Secondary | ICD-10-CM | POA: Insufficient documentation

## 2017-10-24 DIAGNOSIS — E119 Type 2 diabetes mellitus without complications: Secondary | ICD-10-CM | POA: Diagnosis not present

## 2017-10-24 HISTORY — DX: Sleep apnea, unspecified: G47.30

## 2017-10-24 HISTORY — DX: Essential (primary) hypertension: I10

## 2017-10-24 HISTORY — DX: Prediabetes: R73.03

## 2017-10-24 HISTORY — DX: Crohn's disease, unspecified, without complications: K50.90

## 2017-10-24 HISTORY — DX: Nausea with vomiting, unspecified: R11.2

## 2017-10-24 HISTORY — DX: Other specified postprocedural states: Z98.890

## 2017-10-24 LAB — BASIC METABOLIC PANEL
Anion gap: 11 (ref 5–15)
BUN: 13 mg/dL (ref 6–20)
CO2: 28 mmol/L (ref 22–32)
Calcium: 9.3 mg/dL (ref 8.9–10.3)
Chloride: 103 mmol/L (ref 98–111)
Creatinine, Ser: 1.14 mg/dL — ABNORMAL HIGH (ref 0.44–1.00)
GFR calc Af Amer: >60 mL/min (ref >60)
GFR calc non Af Amer: 55 mL/min — ABNORMAL LOW (ref >60)
Glucose, Bld: 93 mg/dL (ref 70–99)
Potassium: 3.4 mmol/L — ABNORMAL LOW (ref 3.5–5.1)
Sodium: 142 mmol/L (ref 135–145)

## 2017-10-24 LAB — CBC
HCT: 38.3 % (ref 36.0–46.0)
Hemoglobin: 12.5 g/dL (ref 12.0–15.0)
MCH: 29.8 pg (ref 26.0–34.0)
MCHC: 32.6 g/dL (ref 30.0–36.0)
MCV: 91.4 fL (ref 78.0–100.0)
Platelets: 299 10*3/uL (ref 150–400)
RBC: 4.19 MIL/uL (ref 3.87–5.11)
RDW: 12.4 % (ref 11.5–15.5)
WBC: 8 10*3/uL (ref 4.0–10.5)

## 2017-10-24 LAB — SURGICAL PCR SCREEN
MRSA, PCR: NEGATIVE
Staphylococcus aureus: NEGATIVE

## 2017-10-24 NOTE — Progress Notes (Signed)
PCP - Harlan Stains Cardiologist - denies  Chest x-ray - not needed EKG - 10/24/17 Stress Test - denies ECHO - denies Cardiac Cath - denies  Sleep Study - about 5 years CPAP - at night instructed to bring mask and tubing the morning of surgery  Fasting Blood Sugar - 90s Checks Blood Sugar __1-2___ times a day    Anesthesia review: yes requesting records  Patient denies shortness of breath, fever, cough and chest pain at PAT appointment   Patient verbalized understanding of instructions that were given to them at the PAT appointment. Patient was also instructed that they will need to review over the PAT instructions again at home before surgery.

## 2017-10-25 NOTE — Progress Notes (Signed)
Anesthesia Chart Review:  Case:  [id number] Date/Time:  10/31/17 0715   Procedure:  L4-5 decompression with insertion of Coflex (N/A ) - 120 mins   Anesthesia type:  General   Pre-op diagnosis:  Lumbar spondylolisthesis with spinal stenosis   Location:  [facility] OR ROOM 04 / [place] [facility]   Surgeon:  Melina Schools, MD      DISCUSSION: 52 yo female never smoker for above procedure. Pertinent hx includes PONV, HTN, DMII, OSA on CPAP, Anemia, Crohn's.  Pt was seen by Donald Prose, MD for preop clearance 10/10/2017. A1c 6.1 and EKG with NSR.  Anticipate can proceed with surgery as planned barring acute status change.  VS: BP 104/67   Pulse 74   Temp 36.9 C   Resp 20   Ht 5' 5"  (1.651 m)   Wt 124 kg   SpO2 96%   BMI 45.49 kg/m   PROVIDERS: Harlan Stains, MD is PCP. Last seen in office by Donald Prose, MD 10/10/2017   LABS: Labs reviewed: Acceptable for surgery. (all labs ordered are listed, but only abnormal results are displayed)  Labs Reviewed  BASIC METABOLIC PANEL - Abnormal; Notable for the following components:      Result Value   Potassium 3.4 (*)    Creatinine, Ser 1.14 (*)    GFR calc non Af Amer 55 (*)    All other components within normal limits  SURGICAL PCR SCREEN  CBC     IMAGES: CT Abd/Pelvis 06/09/15: IMPRESSION: 1. No acute findings. 2. Hepatic steatosis. 3. Status post cholecystectomy and hysterectomy and possibly appendectomy. 4. Few left colon diverticula.  No diverticulitis.   EKG: 10/10/2017 (on pt chart): Sinus rhythm, left axis  CV: N/A  Past Medical History:  Diagnosis Date  . Anemia   . Arthritis   . Crohn disease ([place])   . Fluid retention in legs   . Hypertension   . PONV (postoperative nausea and vomiting)   . Pre-diabetes   . Seasonal allergies    TAKES ZYRTEC AS NEEDED  . Sleep apnea    cpap at night  . Snores     Past Surgical History:  Procedure Laterality Date  . ABDOMINAL HYSTERECTOMY    . APPENDECTOMY    . CARPAL TUNNEL RELEASE   05/16/2011   Procedure: CARPAL TUNNEL RELEASE;  Surgeon: Cammie Sickle., MD;  Location: [facility];  Service: Orthopedics;  Laterality: Left;  . CARPAL TUNNEL RELEASE  08/08/2011   Procedure: CARPAL TUNNEL RELEASE;  Surgeon: Cammie Sickle., MD;  Location: [facility];  Service: Orthopedics;  Laterality: Right;  . CHOLECYSTECTOMY    . COLON SURGERY     patient had intestinal blockage  . COLONOSCOPY    . ESOPHAGOGASTRODUODENOSCOPY    . GASTRIC BYPASS    . KNEE ARTHROSCOPY  05/09/2010   right   . KNEE ARTHROSCOPY  05/10/2007   left  . TOTAL VAGINAL HYSTERECTOMY  07/20/2005   posterior colporrhaphy    MEDICATIONS: . Ascorbic Acid (VITAMIN C PO)  . atorvastatin (LIPITOR) 10 MG tablet  . diclofenac (VOLTAREN) 75 MG EC tablet  . gabapentin (NEURONTIN) 300 MG capsule  . HYDROcodone-acetaminophen (NORCO/VICODIN) 5-325 MG tablet  . MAGNESIUM PO  . meloxicam (MOBIC) 15 MG tablet  . Multiple Vitamins-Minerals (HAIR SKIN AND NAILS FORMULA PO)  . Multiple Vitamins-Minerals (MULTIVITAMIN WITH MINERALS) tablet  . valsartan-hydrochlorothiazide (DIOVAN-HCT) 320-25 MG tablet   No current facility-administered medications for this encounter.  Wynonia Musty [facility] Short Stay Center/Anesthesiology Phone [phone] 10/25/2017 10:55 AM

## 2017-10-26 NOTE — H&P (Addendum)
Patient ID: Vanessa Dean MRN: [id number] DOB/AGE: 04/30/1965 52 y.o.  Admit date: (Not on file)  Admission Diagnoses:  Lumbar spinal stenosis  HPI: Very pleasant 52 year old female pt Reports to clinicDr. Surgical intervention by Dr. Rolena Infante at [facility] patient is to have an L4-5 decompression on 10/31/17.  Past Medical History: Past Medical History:  Diagnosis Date  . Anemia   . Arthritis   . Crohn disease ([place])   . Fluid retention in legs   . Hypertension   . PONV (postoperative nausea and vomiting)   . Pre-diabetes   . Seasonal allergies    TAKES ZYRTEC AS NEEDED  . Sleep apnea    cpap at night  . Snores     Surgical History: Past Surgical History:  Procedure Laterality Date  . ABDOMINAL HYSTERECTOMY    . APPENDECTOMY    . CARPAL TUNNEL RELEASE  05/16/2011   Procedure: CARPAL TUNNEL RELEASE;  Surgeon: Cammie Sickle., MD;  Location: [place];  Service: Orthopedics;  Laterality: Left;  . CARPAL TUNNEL RELEASE  08/08/2011   Procedure: CARPAL TUNNEL RELEASE;  Surgeon: Cammie Sickle., MD;  Location: [place];  Service: Orthopedics;  Laterality: Right;  . CHOLECYSTECTOMY    . COLON SURGERY     patient had intestinal blockage  . COLONOSCOPY    . ESOPHAGOGASTRODUODENOSCOPY    . GASTRIC BYPASS    . KNEE ARTHROSCOPY  05/09/2010   right   . KNEE ARTHROSCOPY  05/10/2007   left  . TOTAL VAGINAL HYSTERECTOMY  07/20/2005   posterior colporrhaphy    Family History: Family History  Problem Relation Age of Onset  . Alcohol abuse Paternal Uncle   . Diabetes Paternal Uncle     Social History: Social History   Socioeconomic History  . Marital status: Married    Spouse name: Not on file  . Number of children: Not on file  . Years of education: Not on file  . Highest education level: Not on file  Occupational History  . Not on file  Social Needs  . Financial resource strain: Not on file  . Food insecurity:   Worry: Not on file    Inability: Not on file  . Transportation needs:    Medical: Not on file    Non-medical: Not on file  Tobacco Use  . Smoking status: Never Smoker  . Smokeless tobacco: Never Used  Substance and Sexual Activity  . Alcohol use: Yes    Comment: SOCIAL  . Drug use: No  . Sexual activity: Yes  Lifestyle  . Physical activity:    Days per week: Not on file    Minutes per session: Not on file  . Stress: Not on file  Relationships  . Social connections:    Talks on phone: Not on file    Gets together: Not on file    Attends religious service: Not on file    Active member of club or organization: Not on file    Attends meetings of clubs or organizations: Not on file    Relationship status: Not on file  . Intimate partner violence:    Fear of current or ex partner: Not on file    Emotionally abused: Not on file    Physically abused: Not on file    Forced sexual activity: Not on file  Other Topics Concern  . Not on file  Social History Narrative  . Not on file  Allergies: Patient has no known allergies.  Medications: I have reviewed the patient's current medications.  Vital Signs: No data found.  Radiology: No results found.  Labs: Recent Labs    10/24/17 1555  WBC 8.0  RBC 4.19  HCT 38.3  PLT 299   Recent Labs    10/24/17 1555  NA 142  K 3.4*  CL 103  CO2 28  BUN 13  CREATININE 1.14*  GLUCOSE 93  CALCIUM 9.3   No results for input(s): LABPT, INR in the last 72 hours.  Review of Systems: ROS  Physical Exam  Constitutional: She is oriented to person, place, and time. She appears well-developed and well-nourished.  HENT:  Head: Normocephalic.  Cardiovascular: Normal rate and regular rhythm.  Respiratory: Effort normal.  GI: Soft. Bowel sounds are normal.  Neurological: She is alert and oriented to person, place, and time.  Skin: Skin is warm and dry.  Psychiatric: She has a normal mood and affect. Her behavior is normal.  Judgment and thought content normal.   Clinical exam: She returns today for follow-up of her debilitating back buttock and bilateral leg pain.  Clinical exam demonstrates significant back buttock and proximal hamstring pain when she stands or extends the spine.  There is significant relief in her pain with forward flexion.  No SI joint pain with direct palpation.  No focal neurological deficits on motor strength testing in the lower extremity.  She does have dysesthesias primarily in the buttock and hamstring bilaterally consistent with neurogenic claudication.  Compartments in the lower extremity are soft and nontender, intact peripheral pulses bilaterally.  No shortness of breath or chest pain, she remains alert and oriented 3  Lumbar MRI: completed on 08/17/17 was reviewed with the patient.  I have also reviewed the radiology report.  Patient has moderate L4-5 spinal stenosis with moderate foraminal and lateral recess stenosis.  Radiology report demonstrates mild disease but in my opinion this is more advanced than mild.  X-rays confirm anterior listhesis at L4-5.   Assessment and Plan: Clinical exam: She returns today for follow-up of her debilitating back buttock and bilateral leg pain.  Clinical exam demonstrates significant back buttock and proximal hamstring pain when she stands or extends the spine.  There is significant relief in her pain with forward flexion.  No SI joint pain with direct palpation.  No focal neurological deficits on motor strength testing in the lower extremity.  She does have dysesthesias primarily in the buttock and hamstring bilaterally consistent with neurogenic claudication.  Compartments in the lower extremity are soft and nontender, intact peripheral pulses bilaterally.  No shortness of breath or chest pain, she remains alert and oriented 3  Lumbar MRI: completed on 08/17/17 was reviewed with the patient.  I have also reviewed the radiology report.  Patient has moderate  L4-5 spinal stenosis with moderate foraminal and lateral recess stenosis.  Radiology report demonstrates mild disease but in my opinion this is more advanced than mild.  X-rays confirm anterior listhesis at L4-5.   Ronette Deter, PAC for Melina Schools, MD [facility] [phone]  No significant change in patient's underlying clinical exam last office visit on 10/26/2017.  Patient is a morbidly obese African-American woman with lumbar spinal stenosis with lateral recess stenosis causing neurogenic claudication pain.  Attempts at conservative management had failed to alleviate her pain.  She primarily has right leg pain consistent with neurogenic claudication.  Because of the failure of conservative management we elected to move forward with  surgical intervention.  Plan was a straightforward lumbar decompression at L4-5.  All appropriate risks benefits and alternatives were discussed with the patient and her family and their questions were addressed.

## 2017-10-30 MED ORDER — DEXTROSE 5 % IV SOLN
3.0000 g | INTRAVENOUS | Status: AC
  Administered 2017-10-31: 3 g via INTRAVENOUS
  Filled 2017-10-30: qty 3

## 2017-10-30 NOTE — Anesthesia Preprocedure Evaluation (Addendum)
Anesthesia Evaluation  Patient identified by MRN, date of birth, ID band Patient awake    Reviewed: Allergy & Precautions, NPO status , Patient's Chart, lab work & pertinent test results  History of Anesthesia Complications Negative for: history of anesthetic complications (denies PONV)  Airway Mallampati: II  TM Distance: >3 FB Neck ROM: Full    Dental  (+) Dental Advisory Given, Teeth Intact,    Pulmonary sleep apnea and Continuous Positive Airway Pressure Ventilation ,    breath sounds clear to auscultation       Cardiovascular hypertension, Pt. on medications  Rhythm:Regular Rate:Normal     Neuro/Psych negative neurological ROS  negative psych ROS   GI/Hepatic Neg liver ROS,  Crohn's disease    Endo/Other  diabetes (diet controlled), Well ControlledMorbid obesity   Renal/GU negative Renal ROS  negative genitourinary   Musculoskeletal  (+) Arthritis ,   Abdominal (+) + obese,   Peds  Hematology negative hematology ROS (+)   Anesthesia Other Findings   Reproductive/Obstetrics                            Anesthesia Physical Anesthesia Plan  ASA: III  Anesthesia Plan: General   Post-op Pain Management:    Induction: Intravenous  PONV Risk Score and Plan: 4 or greater and Ondansetron, Dexamethasone, Midazolam, Scopolamine patch - Pre-op and Treatment may vary due to age or medical condition  Airway Management Planned: Oral ETT  Additional Equipment: None  Intra-op Plan:   Post-operative Plan: Extubation in OR  Informed Consent: I have reviewed the patients History and Physical, chart, labs and discussed the procedure including the risks, benefits and alternatives for the proposed anesthesia with the patient or authorized representative who has indicated his/her understanding and acceptance.   Dental advisory given  Plan Discussed with: CRNA and  Anesthesiologist  Anesthesia Plan Comments:        Anesthesia Quick Evaluation

## 2017-10-31 ENCOUNTER — Ambulatory Visit (HOSPITAL_COMMUNITY): Payer: BC Managed Care – PPO | Admitting: Physician Assistant

## 2017-10-31 ENCOUNTER — Ambulatory Visit (HOSPITAL_COMMUNITY): Payer: BC Managed Care – PPO

## 2017-10-31 ENCOUNTER — Observation Stay (HOSPITAL_COMMUNITY)
Admission: RE | Admit: 2017-10-31 | Discharge: 2017-11-01 | Disposition: A | Discharge status: Home / Self Care | Payer: BC Managed Care – PPO | Source: Ambulatory Visit | Attending: Orthopedic Surgery | Admitting: Orthopedic Surgery

## 2017-10-31 ENCOUNTER — Ambulatory Visit (HOSPITAL_COMMUNITY): Payer: BC Managed Care – PPO | Admitting: Anesthesiology

## 2017-10-31 ENCOUNTER — Other Ambulatory Visit: Payer: Self-pay

## 2017-10-31 ENCOUNTER — Encounter (HOSPITAL_COMMUNITY): Payer: Self-pay | Admitting: *Deleted

## 2017-10-31 ENCOUNTER — Encounter (HOSPITAL_COMMUNITY)
Admission: RE | Disposition: A | Discharge status: Home / Self Care | Payer: Self-pay | Source: Ambulatory Visit | Attending: Orthopedic Surgery

## 2017-10-31 DIAGNOSIS — Z6841 Body Mass Index (BMI) 40.0 and over, adult: Secondary | ICD-10-CM | POA: Insufficient documentation

## 2017-10-31 DIAGNOSIS — Z79899 Other long term (current) drug therapy: Secondary | ICD-10-CM | POA: Diagnosis not present

## 2017-10-31 DIAGNOSIS — K5090 Crohn's disease, unspecified, without complications: Secondary | ICD-10-CM | POA: Insufficient documentation

## 2017-10-31 DIAGNOSIS — G4730 Sleep apnea, unspecified: Secondary | ICD-10-CM | POA: Diagnosis not present

## 2017-10-31 DIAGNOSIS — Z9889 Other specified postprocedural states: Secondary | ICD-10-CM

## 2017-10-31 DIAGNOSIS — R7303 Prediabetes: Secondary | ICD-10-CM | POA: Diagnosis not present

## 2017-10-31 DIAGNOSIS — M48062 Spinal stenosis, lumbar region with neurogenic claudication: Secondary | ICD-10-CM | POA: Diagnosis present

## 2017-10-31 DIAGNOSIS — Z419 Encounter for procedure for purposes other than remedying health state, unspecified: Secondary | ICD-10-CM

## 2017-10-31 DIAGNOSIS — I10 Essential (primary) hypertension: Secondary | ICD-10-CM | POA: Insufficient documentation

## 2017-10-31 HISTORY — PX: LUMBAR LAMINECTOMY/DECOMPRESSION MICRODISCECTOMY: SHX5026

## 2017-10-31 LAB — GLUCOSE, CAPILLARY: Glucose-Capillary: 123 mg/dL — ABNORMAL HIGH (ref 70–99)

## 2017-10-31 SURGERY — LUMBAR LAMINECTOMY/DECOMPRESSION MICRODISCECTOMY 1 LEVEL
Anesthesia: General | Site: Spine Lumbar

## 2017-10-31 MED ORDER — ACETAMINOPHEN 10 MG/ML IV SOLN
INTRAVENOUS | Status: DC | PRN
  Administered 2017-10-31: 1000 mg via INTRAVENOUS

## 2017-10-31 MED ORDER — OXYCODONE HCL 5 MG PO TABS
10.0000 mg | ORAL_TABLET | ORAL | Status: DC | PRN
  Administered 2017-10-31 – 2017-11-01 (×6): 10 mg via ORAL
  Filled 2017-10-31 (×6): qty 2

## 2017-10-31 MED ORDER — ONDANSETRON HCL 4 MG/2ML IJ SOLN
INTRAMUSCULAR | Status: AC
  Filled 2017-10-31: qty 2

## 2017-10-31 MED ORDER — PHENOL 1.4 % MT LIQD: 1.0000 | OROMUCOSAL | Status: DC | PRN

## 2017-10-31 MED ORDER — ONDANSETRON HCL 4 MG/2ML IJ SOLN
INTRAMUSCULAR | Status: DC | PRN
  Administered 2017-10-31: 4 mg via INTRAVENOUS

## 2017-10-31 MED ORDER — LIDOCAINE 2% (20 MG/ML) 5 ML SYRINGE
INTRAMUSCULAR | Status: DC | PRN
  Administered 2017-10-31: 100 mg via INTRAVENOUS

## 2017-10-31 MED ORDER — ROCURONIUM BROMIDE 50 MG/5ML IV SOSY
PREFILLED_SYRINGE | INTRAVENOUS | Status: AC
  Filled 2017-10-31: qty 5

## 2017-10-31 MED ORDER — METHOCARBAMOL 1000 MG/10ML IJ SOLN
500.0000 mg | Freq: Four times a day (QID) | INTRAVENOUS | Status: DC | PRN
  Filled 2017-10-31: qty 5

## 2017-10-31 MED ORDER — ONDANSETRON HCL 4 MG/2ML IJ SOLN: 4.0000 mg | Freq: Four times a day (QID) | INTRAMUSCULAR | Status: DC | PRN

## 2017-10-31 MED ORDER — METHOCARBAMOL 500 MG PO TABS
ORAL_TABLET | ORAL | Status: AC
  Filled 2017-10-31: qty 1

## 2017-10-31 MED ORDER — MAGNESIUM CITRATE PO SOLN: 1.0000 | Freq: Once | ORAL | Status: DC | PRN

## 2017-10-31 MED ORDER — THROMBIN (RECOMBINANT) 20000 UNITS EX SOLR
CUTANEOUS | Status: AC
  Filled 2017-10-31: qty 20000

## 2017-10-31 MED ORDER — ACETAMINOPHEN 10 MG/ML IV SOLN
INTRAVENOUS | Status: AC
  Filled 2017-10-31: qty 100

## 2017-10-31 MED ORDER — POLYETHYLENE GLYCOL 3350 17 G PO PACK: 17.0000 g | PACK | Freq: Every day | ORAL | Status: DC | PRN

## 2017-10-31 MED ORDER — FENTANYL CITRATE (PF) 100 MCG/2ML IJ SOLN
INTRAMUSCULAR | Status: AC
  Filled 2017-10-31: qty 2

## 2017-10-31 MED ORDER — SCOPOLAMINE 1 MG/3DAYS TD PT72
MEDICATED_PATCH | TRANSDERMAL | Status: DC | PRN
  Administered 2017-10-31: 1 via TRANSDERMAL

## 2017-10-31 MED ORDER — EPHEDRINE 5 MG/ML INJ
INTRAVENOUS | Status: AC
  Filled 2017-10-31: qty 10

## 2017-10-31 MED ORDER — DEXAMETHASONE SODIUM PHOSPHATE 10 MG/ML IJ SOLN
INTRAMUSCULAR | Status: DC | PRN
  Administered 2017-10-31: 10 mg via INTRAVENOUS

## 2017-10-31 MED ORDER — SUCCINYLCHOLINE CHLORIDE 200 MG/10ML IV SOSY
PREFILLED_SYRINGE | INTRAVENOUS | Status: DC | PRN
  Administered 2017-10-31: 120 mg via INTRAVENOUS

## 2017-10-31 MED ORDER — ONDANSETRON 4 MG PO TBDP: 4.0000 mg | ORAL_TABLET | Freq: Three times a day (TID) | ORAL | 0 refills | Status: DC | PRN

## 2017-10-31 MED ORDER — CEFAZOLIN SODIUM-DEXTROSE 2-4 GM/100ML-% IV SOLN
2.0000 g | Freq: Three times a day (TID) | INTRAVENOUS | Status: AC
  Administered 2017-10-31 (×2): 2 g via INTRAVENOUS
  Filled 2017-10-31 (×2): qty 100

## 2017-10-31 MED ORDER — OXYCODONE HCL 5 MG PO TABS
5.0000 mg | ORAL_TABLET | Freq: Once | ORAL | Status: AC | PRN
  Administered 2017-10-31: 5 mg via ORAL

## 2017-10-31 MED ORDER — OXYCODONE HCL 5 MG/5ML PO SOLN: 5.0000 mg | Freq: Once | ORAL | Status: AC | PRN

## 2017-10-31 MED ORDER — METHOCARBAMOL 500 MG PO TABS
500.0000 mg | ORAL_TABLET | Freq: Four times a day (QID) | ORAL | Status: DC | PRN
  Administered 2017-10-31 – 2017-11-01 (×3): 500 mg via ORAL
  Filled 2017-10-31 (×2): qty 1

## 2017-10-31 MED ORDER — THROMBIN 20000 UNITS EX SOLR
CUTANEOUS | Status: DC | PRN
  Administered 2017-10-31: 08:00:00

## 2017-10-31 MED ORDER — LIDOCAINE 2% (20 MG/ML) 5 ML SYRINGE
INTRAMUSCULAR | Status: AC
  Filled 2017-10-31: qty 5

## 2017-10-31 MED ORDER — PROPOFOL 10 MG/ML IV BOLUS
INTRAVENOUS | Status: AC
  Filled 2017-10-31: qty 20

## 2017-10-31 MED ORDER — MIDAZOLAM HCL 2 MG/2ML IJ SOLN
INTRAMUSCULAR | Status: AC
  Filled 2017-10-31: qty 2

## 2017-10-31 MED ORDER — METHOCARBAMOL 500 MG PO TABS: 500.0000 mg | ORAL_TABLET | Freq: Three times a day (TID) | ORAL | 0 refills | Status: DC

## 2017-10-31 MED ORDER — OXYCODONE-ACETAMINOPHEN 10-325 MG PO TABS: 1.0000 | ORAL_TABLET | ORAL | 0 refills | Status: AC | PRN

## 2017-10-31 MED ORDER — PHENYLEPHRINE 40 MCG/ML (10ML) SYRINGE FOR IV PUSH (FOR BLOOD PRESSURE SUPPORT)
PREFILLED_SYRINGE | INTRAVENOUS | Status: AC
  Filled 2017-10-31: qty 10

## 2017-10-31 MED ORDER — DEXAMETHASONE SODIUM PHOSPHATE 10 MG/ML IJ SOLN
INTRAMUSCULAR | Status: AC
  Filled 2017-10-31: qty 1

## 2017-10-31 MED ORDER — BUPIVACAINE-EPINEPHRINE (PF) 0.25% -1:200000 IJ SOLN
INTRAMUSCULAR | Status: AC
  Filled 2017-10-31: qty 30

## 2017-10-31 MED ORDER — HYDROCHLOROTHIAZIDE 25 MG PO TABS
25.0000 mg | ORAL_TABLET | Freq: Every day | ORAL | Status: DC
  Administered 2017-10-31 – 2017-11-01 (×2): 25 mg via ORAL
  Filled 2017-10-31 (×2): qty 1

## 2017-10-31 MED ORDER — LACTATED RINGERS IV SOLN
INTRAVENOUS | Status: DC
  Administered 2017-10-31: 14:00:00 via INTRAVENOUS

## 2017-10-31 MED ORDER — ACETAMINOPHEN 325 MG PO TABS
650.0000 mg | ORAL_TABLET | ORAL | Status: DC | PRN
  Administered 2017-10-31: 650 mg via ORAL
  Filled 2017-10-31 (×2): qty 2

## 2017-10-31 MED ORDER — PROMETHAZINE HCL 25 MG/ML IJ SOLN: 6.2500 mg | INTRAMUSCULAR | Status: DC | PRN

## 2017-10-31 MED ORDER — MENTHOL 3 MG MT LOZG: 1.0000 | LOZENGE | OROMUCOSAL | Status: DC | PRN

## 2017-10-31 MED ORDER — FENTANYL CITRATE (PF) 100 MCG/2ML IJ SOLN
25.0000 ug | INTRAMUSCULAR | Status: DC | PRN
  Administered 2017-10-31 (×2): 50 ug via INTRAVENOUS

## 2017-10-31 MED ORDER — SUCCINYLCHOLINE CHLORIDE 200 MG/10ML IV SOSY
PREFILLED_SYRINGE | INTRAVENOUS | Status: AC
  Filled 2017-10-31: qty 10

## 2017-10-31 MED ORDER — MORPHINE SULFATE (PF) 2 MG/ML IV SOLN
1.0000 mg | INTRAVENOUS | Status: DC | PRN
  Administered 2017-10-31: 1 mg via INTRAVENOUS
  Filled 2017-10-31: qty 1

## 2017-10-31 MED ORDER — EPHEDRINE SULFATE-NACL 50-0.9 MG/10ML-% IV SOSY
PREFILLED_SYRINGE | INTRAVENOUS | Status: DC | PRN
  Administered 2017-10-31 (×3): 10 mg via INTRAVENOUS

## 2017-10-31 MED ORDER — GABAPENTIN 300 MG PO CAPS
300.0000 mg | ORAL_CAPSULE | Freq: Every evening | ORAL | Status: DC
  Administered 2017-10-31: 300 mg via ORAL
  Filled 2017-10-31: qty 1

## 2017-10-31 MED ORDER — DOCUSATE SODIUM 100 MG PO CAPS
100.0000 mg | ORAL_CAPSULE | Freq: Two times a day (BID) | ORAL | Status: DC
  Administered 2017-10-31 – 2017-11-01 (×3): 100 mg via ORAL
  Filled 2017-10-31 (×3): qty 1

## 2017-10-31 MED ORDER — IRBESARTAN 300 MG PO TABS
300.0000 mg | ORAL_TABLET | Freq: Every day | ORAL | Status: DC
  Administered 2017-10-31 – 2017-11-01 (×2): 300 mg via ORAL
  Filled 2017-10-31 (×2): qty 1

## 2017-10-31 MED ORDER — ACETAMINOPHEN 650 MG RE SUPP: 650.0000 mg | RECTAL | Status: DC | PRN

## 2017-10-31 MED ORDER — PROPOFOL 10 MG/ML IV BOLUS
INTRAVENOUS | Status: DC | PRN
  Administered 2017-10-31: 200 mg via INTRAVENOUS

## 2017-10-31 MED ORDER — SODIUM CHLORIDE 0.9% FLUSH: 3.0000 mL | Freq: Two times a day (BID) | INTRAVENOUS | Status: DC

## 2017-10-31 MED ORDER — ATORVASTATIN CALCIUM 20 MG PO TABS
10.0000 mg | ORAL_TABLET | Freq: Every evening | ORAL | Status: DC
  Administered 2017-10-31: 10 mg via ORAL
  Filled 2017-10-31: qty 1

## 2017-10-31 MED ORDER — LACTATED RINGERS IV SOLN
INTRAVENOUS | Status: DC | PRN
  Administered 2017-10-31: 07:00:00 via INTRAVENOUS

## 2017-10-31 MED ORDER — 0.9 % SODIUM CHLORIDE (POUR BTL) OPTIME
TOPICAL | Status: DC | PRN
  Administered 2017-10-31 (×2): 1000 mL

## 2017-10-31 MED ORDER — FENTANYL CITRATE (PF) 250 MCG/5ML IJ SOLN
INTRAMUSCULAR | Status: AC
  Filled 2017-10-31: qty 5

## 2017-10-31 MED ORDER — OXYCODONE HCL 5 MG PO TABS
ORAL_TABLET | ORAL | Status: AC
  Filled 2017-10-31: qty 1

## 2017-10-31 MED ORDER — WHITE PETROLATUM EX OINT
TOPICAL_OINTMENT | CUTANEOUS | Status: AC
  Administered 2017-10-31: 21:00:00
  Filled 2017-10-31: qty 28.35

## 2017-10-31 MED ORDER — BUPIVACAINE-EPINEPHRINE 0.25% -1:200000 IJ SOLN
INTRAMUSCULAR | Status: DC | PRN
  Administered 2017-10-31: 10 mL

## 2017-10-31 MED ORDER — HEMOSTATIC AGENTS (NO CHARGE) OPTIME
TOPICAL | Status: DC | PRN
  Administered 2017-10-31 (×3): 1

## 2017-10-31 MED ORDER — MIDAZOLAM HCL 5 MG/5ML IJ SOLN
INTRAMUSCULAR | Status: DC | PRN
  Administered 2017-10-31: 2 mg via INTRAVENOUS

## 2017-10-31 MED ORDER — OXYCODONE HCL 5 MG PO TABS: 5.0000 mg | ORAL_TABLET | ORAL | Status: DC | PRN

## 2017-10-31 MED ORDER — ROCURONIUM BROMIDE 50 MG/5ML IV SOSY
PREFILLED_SYRINGE | INTRAVENOUS | Status: DC | PRN
  Administered 2017-10-31: 20 mg via INTRAVENOUS
  Administered 2017-10-31: 50 mg via INTRAVENOUS

## 2017-10-31 MED ORDER — VALSARTAN-HYDROCHLOROTHIAZIDE 320-25 MG PO TABS: 1.0000 | ORAL_TABLET | Freq: Every day | ORAL | Status: DC

## 2017-10-31 MED ORDER — ONDANSETRON HCL 4 MG PO TABS: 4.0000 mg | ORAL_TABLET | Freq: Four times a day (QID) | ORAL | Status: DC | PRN

## 2017-10-31 MED ORDER — SODIUM CHLORIDE 0.9% FLUSH: 3.0000 mL | INTRAVENOUS | Status: DC | PRN

## 2017-10-31 MED ORDER — FENTANYL CITRATE (PF) 100 MCG/2ML IJ SOLN
INTRAMUSCULAR | Status: DC | PRN
  Administered 2017-10-31: 100 ug via INTRAVENOUS
  Administered 2017-10-31: 50 ug via INTRAVENOUS

## 2017-10-31 MED ORDER — PHENYLEPHRINE 40 MCG/ML (10ML) SYRINGE FOR IV PUSH (FOR BLOOD PRESSURE SUPPORT)
PREFILLED_SYRINGE | INTRAVENOUS | Status: DC | PRN
  Administered 2017-10-31: 80 ug via INTRAVENOUS
  Administered 2017-10-31: 40 ug via INTRAVENOUS
  Administered 2017-10-31: 80 ug via INTRAVENOUS
  Administered 2017-10-31 (×4): 40 ug via INTRAVENOUS

## 2017-10-31 SURGICAL SUPPLY — 70 items
BNDG GAUZE ELAST 4 BULKY (GAUZE/BANDAGES/DRESSINGS) IMPLANT
BUR EGG ELITE 4.0 (BURR) IMPLANT
BUR MATCHSTICK NEURO 3.0 LAGG (BURR) IMPLANT
CANISTER SUCT 3000ML PPV (MISCELLANEOUS) ×2 IMPLANT
CLSR STERI-STRIP ANTIMIC 1/2X4 (GAUZE/BANDAGES/DRESSINGS) ×2 IMPLANT
CORD BI POLAR (MISCELLANEOUS) ×2 IMPLANT
COVER SURGICAL LIGHT HANDLE (MISCELLANEOUS) IMPLANT
DRAIN CHANNEL 15F RND FF W/TCR (WOUND CARE) ×2 IMPLANT
DRAPE INCISE IOBAN 66X45 STRL (DRAPES) ×2 IMPLANT
DRAPE POUCH INSTRU U-SHP 10X18 (DRAPES) ×2 IMPLANT
DRAPE SURG 17X23 STRL (DRAPES) ×2 IMPLANT
DRAPE U-SHAPE 47X51 STRL (DRAPES) ×2 IMPLANT
DRSG OPSITE POSTOP 3X4 (GAUZE/BANDAGES/DRESSINGS) IMPLANT
DRSG OPSITE POSTOP 4X6 (GAUZE/BANDAGES/DRESSINGS) ×2 IMPLANT
DURAPREP 26ML APPLICATOR (WOUND CARE) ×2 IMPLANT
ELECT BLADE 4.0 EZ CLEAN MEGAD (MISCELLANEOUS)
ELECT CAUTERY BLADE 6.4 (BLADE) ×2 IMPLANT
ELECT PENCIL ROCKER SW 15FT (MISCELLANEOUS) ×2 IMPLANT
ELECT REM PT RETURN 9FT ADLT (ELECTROSURGICAL) ×2
ELECTRODE BLDE 4.0 EZ CLN MEGD (MISCELLANEOUS) IMPLANT
ELECTRODE REM PT RTRN 9FT ADLT (ELECTROSURGICAL) ×1 IMPLANT
EVACUATOR SILICONE 100CC (DRAIN) ×2 IMPLANT
FLOSEAL 10ML (HEMOSTASIS) IMPLANT
FLOSEAL 5ML (HEMOSTASIS) ×2 IMPLANT
GLOVE BIO SURGEON STRL SZ 6.5 (GLOVE) ×4 IMPLANT
GLOVE BIOGEL PI IND STRL 6.5 (GLOVE) ×1 IMPLANT
GLOVE BIOGEL PI IND STRL 8 (GLOVE) ×2 IMPLANT
GLOVE BIOGEL PI IND STRL 8.5 (GLOVE) ×1 IMPLANT
GLOVE BIOGEL PI INDICATOR 6.5 (GLOVE) ×1
GLOVE BIOGEL PI INDICATOR 8 (GLOVE) ×2
GLOVE BIOGEL PI INDICATOR 8.5 (GLOVE) ×1
GLOVE ECLIPSE 8.0 STRL XLNG CF (GLOVE) ×8 IMPLANT
GLOVE SS BIOGEL STRL SZ 8.5 (GLOVE) ×2 IMPLANT
GLOVE SUPERSENSE BIOGEL SZ 8.5 (GLOVE) ×2
GOWN STRL REUS W/ TWL LRG LVL3 (GOWN DISPOSABLE) ×1 IMPLANT
GOWN STRL REUS W/ TWL XL LVL3 (GOWN DISPOSABLE) IMPLANT
GOWN STRL REUS W/TWL 2XL LVL3 (GOWN DISPOSABLE) ×4 IMPLANT
GOWN STRL REUS W/TWL LRG LVL3 (GOWN DISPOSABLE) ×1
GOWN STRL REUS W/TWL XL LVL3 (GOWN DISPOSABLE)
KIT BASIN OR (CUSTOM PROCEDURE TRAY) ×2 IMPLANT
KIT TURNOVER KIT B (KITS) ×2 IMPLANT
NEEDLE 22X1 1/2 (OR ONLY) (NEEDLE) ×4 IMPLANT
NEEDLE SPNL 18GX3.5 QUINCKE PK (NEEDLE) ×4 IMPLANT
NS IRRIG 1000ML POUR BTL (IV SOLUTION) ×4 IMPLANT
PACK LAMINECTOMY ORTHO (CUSTOM PROCEDURE TRAY) ×2 IMPLANT
PACK UNIVERSAL I (CUSTOM PROCEDURE TRAY) ×2 IMPLANT
PAD ARMBOARD 7.5X6 YLW CONV (MISCELLANEOUS) ×4 IMPLANT
PATTIES SURGICAL .5 X.5 (GAUZE/BANDAGES/DRESSINGS) ×2 IMPLANT
PATTIES SURGICAL .5 X1 (DISPOSABLE) ×2 IMPLANT
SPONGE SURGIFOAM ABS GEL 100 (HEMOSTASIS) ×2 IMPLANT
SPONGE SURGIFOAM ABS GEL SZ50 (HEMOSTASIS) ×2 IMPLANT
STRIP CLOSURE SKIN 1/2X4 (GAUZE/BANDAGES/DRESSINGS) ×2 IMPLANT
SURGIFLO W/THROMBIN 8M KIT (HEMOSTASIS) ×2 IMPLANT
SUT BONE WAX W31G (SUTURE) ×2 IMPLANT
SUT MNCRL AB 3-0 PS2 18 (SUTURE) ×2 IMPLANT
SUT MON AB 3-0 SH 27 (SUTURE) ×1
SUT MON AB 3-0 SH27 (SUTURE) ×1 IMPLANT
SUT SILK 3 0 SH 30 (SUTURE) ×2 IMPLANT
SUT VIC AB 0 CT1 27 (SUTURE) ×1
SUT VIC AB 0 CT1 27XBRD ANBCTR (SUTURE) IMPLANT
SUT VIC AB 0 CT1 27XBRD ANTBC (SUTURE) ×1 IMPLANT
SUT VIC AB 1 CT1 18XCR BRD 8 (SUTURE) ×1 IMPLANT
SUT VIC AB 1 CT1 8-18 (SUTURE) ×1
SUT VIC AB 2-0 CT1 18 (SUTURE) ×4 IMPLANT
SYR BULB IRRIGATION 50ML (SYRINGE) ×2 IMPLANT
SYR CONTROL 10ML LL (SYRINGE) IMPLANT
TOWEL GREEN STERILE (TOWEL DISPOSABLE) ×2 IMPLANT
TOWEL GREEN STERILE FF (TOWEL DISPOSABLE) ×2 IMPLANT
WATER STERILE IRR 1000ML POUR (IV SOLUTION) ×2 IMPLANT
YANKAUER SUCT BULB TIP NO VENT (SUCTIONS) ×2 IMPLANT

## 2017-10-31 NOTE — Progress Notes (Signed)
Pt stated she didn't want to wear Cpap RN aware

## 2017-10-31 NOTE — Transfer of Care (Signed)
Immediate Anesthesia Transfer of Care Note  Patient: Vanessa Dean  Procedure(s) Performed: LUMBAR LAMINECTOMY/DECOMPRESSION MICRODISCECTOMY ONE LEVEL  LUMBAR FOUR - LUMBAR FIVE DECOMPRESSION (N/A Spine Lumbar)  Patient Location: PACU  Anesthesia Type:General  Level of Consciousness: awake, alert , oriented and patient cooperative  Airway & Oxygen Therapy: Patient Spontanous Breathing and Patient connected to face mask oxygen  Post-op Assessment: Report given to RN and Post -op Vital signs reviewed and stable  Post vital signs: Reviewed and stable  Last Vitals:  Vitals Value Taken Time  BP 152/84 10/31/2017 10:25 AM  Temp    Pulse 93 10/31/2017 10:28 AM  Resp 29 10/31/2017 10:28 AM  SpO2 96 % 10/31/2017 10:28 AM  Vitals shown include unvalidated device data.  Last Pain:  Vitals:   10/31/17 0613  TempSrc:   PainSc: 4          Complications: No apparent anesthesia complications

## 2017-10-31 NOTE — Anesthesia Procedure Notes (Signed)
Procedure Name: Intubation Date/Time: 10/31/2017 7:42 AM Performed by: Teressa Lower., CRNA Pre-anesthesia Checklist: Patient identified, Emergency Drugs available, Suction available and Patient being monitored Patient Re-evaluated:Patient Re-evaluated prior to induction Oxygen Delivery Method: Circle system utilized Preoxygenation: Pre-oxygenation with 100% oxygen Induction Type: IV induction Ventilation: Mask ventilation without difficulty and Oral airway inserted - appropriate to patient size Laryngoscope Size: 3 Grade View: Grade I Tube type: Oral Tube size: 7.0 mm Number of attempts: 1 Airway Equipment and Method: Stylet and Oral airway Placement Confirmation: ETT inserted through vocal cords under direct vision,  positive ETCO2 and breath sounds checked- equal and bilateral Secured at: 21 cm Tube secured with: Tape Dental Injury: Teeth and Oropharynx as per pre-operative assessment  Comments: Inserted by Lupita Shutter, SRNA

## 2017-10-31 NOTE — Evaluation (Signed)
Occupational Therapy Evaluation Patient Details Name: Vanessa Dean MRN: [id number] DOB: 1965-09-01 Today's Date: 10/31/2017    History of Present Illness Vanessa Dean is a 52yo F admitted with moderate L4-5 spinal stenosis with moderate foraminal and lateral recess stenosis and is now s/p L4-5 decompression.    Clinical Impression   Pt admitted with above dx and now s/p L4-5 decompression. Pt required min lifting A from low bed but was able to progress to (S) level with 300 ft of functional mobility without AE. Pt was given demo re figure 4 technique for LB dressing with teach back performed. No OT follow up necessary upon d/c, pt would benefit from another OT visit while here to confirm carryover.     Follow Up Recommendations  No OT follow up    Equipment Recommendations  None recommended by OT    Recommendations for Other Services PT consult     Precautions / Restrictions Precautions Precautions: Back Precaution Comments: verbally reviewed with pt Required Braces or Orthoses: Spinal Brace Spinal Brace: Lumbar corset;Applied in sitting position Restrictions Weight Bearing Restrictions: No      Mobility Bed Mobility Overal bed mobility: Needs Assistance Bed Mobility: Rolling;Sidelying to Sit Rolling: Min guard Sidelying to sit: Supervision       General bed mobility comments: vc for adherence to spinal precautions  Transfers Overall transfer level: Needs assistance Equipment used: None Transfers: Sit to/from [employer] Transfers Sit to Stand: Min assist Stand pivot transfers: Supervision       General transfer comment: min lifting A required     Balance Overall balance assessment: Mild deficits observed, not formally tested                                         ADL either performed or assessed with clinical judgement   ADL Overall ADL's : Needs assistance/impaired Eating/Feeding: Independent   Grooming: Standing;Wash/dry  hands;Modified independent           Upper Body Dressing : Set up;Sitting   Lower Body Dressing: Minimal assistance;Sit to/from stand Lower Body Dressing Details (indicate cue type and reason): reaching distal min A, with edu re figure 4 technique pt able to progress to set up Toilet Transfer: Minimal assistance   Toileting- Clothing Manipulation and Hygiene: Supervision/safety       Functional mobility during ADLs: Supervision/safety       Vision Baseline Vision/History: Wears glasses Wears Glasses: At all times Patient Visual Report: No change from baseline Vision Assessment?: No apparent visual deficits     Perception     Praxis      Pertinent Vitals/Pain Pain Assessment: 0-10 Pain Score: 4  Pain Location: L back Pain Descriptors / Indicators: Aching Pain Intervention(s): Monitored during session     Hand Dominance Right   Extremity/Trunk Assessment Upper Extremity Assessment Upper Extremity Assessment: Overall WFL for tasks assessed   Lower Extremity Assessment Lower Extremity Assessment: Defer to PT evaluation   Cervical / Trunk Assessment Cervical / Trunk Assessment: Other exceptions Cervical / Trunk Exceptions: s/p spinal sx   Communication Communication Communication: No difficulties   Cognition Arousal/Alertness: Awake/alert Behavior During Therapy: WFL for tasks assessed/performed Overall Cognitive Status: Within Functional Limits for tasks assessed  General Comments       Exercises     Shoulder Instructions      Home Living Family/patient expects to be discharged to:: Private residence Living Arrangements: Spouse/significant other;Children Available Help at Discharge: Family Type of Home: House Home Access: Stairs to enter     Home Layout: Two level;Bed/bath upstairs Alternate Level Stairs-Number of Steps: 12 Alternate Level Stairs-Rails: Can reach both Bathroom Shower/Tub:  Tub/shower unit;Walk-in shower   Bathroom Toilet: Standard Bathroom Accessibility: Yes How Accessible: Accessible via walker Home Equipment: None          Prior Functioning/Environment Level of Independence: Independent                 OT Problem List: Pain;Impaired balance (sitting and/or standing)      OT Treatment/Interventions: Therapeutic exercise;Self-care/ADL training;Neuromuscular education;Therapeutic activities;Balance training    OT Goals(Current goals can be found in the care plan section) Acute Rehab OT Goals Patient Stated Goal: get back to me OT Goal Formulation: With patient Time For Goal Achievement: 11/10/17 Potential to Achieve Goals: Good  OT Frequency: Min 2X/week   Barriers to D/C:            Co-evaluation              AM-PAC PT "6 Clicks" Daily Activity     Outcome Measure Help from another person eating meals?: None Help from another person taking care of personal grooming?: None   Help from another person bathing (including washing, rinsing, drying)?: A Little Help from another person to put on and taking off regular upper body clothing?: A Little Help from another person to put on and taking off regular lower body clothing?: A Little 6 Click Score: 17   End of Session Nurse Communication: Mobility status  Activity Tolerance: Patient tolerated treatment well Patient left: in bed;with family/visitor present  OT Visit Diagnosis: Unsteadiness on feet (R26.81)                Time: 1537-1600 OT Time Calculation (min): 23 min Charges:  OT General Charges $OT Visit: 1 Visit OT Evaluation $OT Eval Low Complexity: 1 Low OT Treatments $Therapeutic Activity: 8-22 mins  Curtis Sites  OTR/L 10/31/2017, 4:12 PM

## 2017-10-31 NOTE — Op Note (Signed)
Operative report  Preoperative diagnosis: Lumbar spinal stenosis with neurogenic claudication L4-5  Postoperative diagnosis same  Operative procedure: Lumbar L4-5 decompression  Complications: None  First assistant: Ronette Deter, PA  Occasions: This is a very pleasant 52 year old woman with significant back buttock and neuropathic leg pain.  Attempts at conservative management failed to alleviate her symptoms.  Clinical exam and MRI confirmed lumbar spinal stenosis with neurogenic claudication.  Because of the failure of conservative modalities we elected to proceed with a lumbar decompression.  All appropriate risks benefits and alternatives were discussed with the patient and consent was obtained  Operative report She was brought the operating room placed upon the operating table.  After successful induction of general anesthesia and endotracheal intubation teds SCDs were applied she was turned prone onto the Wilson frame.  All bony prominences well-padded and the back was prepped and draped in a standard fashion.  Timeout was taken to confirm patient procedure and all other important data.  2 needles were placed into the back and an x-ray was taken for localization of the incision.  The incision site was infiltrated with quarter percent Marcaine and the midline incision was made centered over the L4-5 level.  Sharp dissection was carried out down to the deep fascia.  Deep fascia was incised and stripped to expose the spinous process and lamina of L4 and L5.  Once the approach was complete self-retaining retractors were placed and a Penfield 4 was placed underneath the L4 lamina.  Intraoperative x-ray was taken and I confirmed that was at the appropriate level.  Double-action Leksell rongeur was used to remove the bulk of the L4 spinous process.  Using Kerrison rongeurs I performed a generous laminotomy bilaterally at L4.  At this point I exposed the ligamentum flavum.  It was significantly  thickened and partially calcified.  Using a Penfield 4 I dissected through the central raphae and then used my 2 and 3 mm Kerrison punch to resect the antral portion of the ligamentum flavum and exposed the underlying thecal sac.  I then continued into the lateral recess removing the ligamentum flavum and taking down the medial aspect of the superior L5 facet to adequately decompress the lateral recess.  Identified the L5 nerve root in the lateral recess and made sure an adequate decompression spanning from superior portion of the disc to the L5 foramen itself.  I traced the L5 nerve root into the foramen decompressing.  My decompression was also taken out to the medial wall of the pedicle.  At this point the L5 nerve root was freely mobile and no longer compressed in the lateral recess.  I was able to pass my [facility] elevator superiorly inferiorly and medially on the right-hand side and traced the L5 nerve root into its foramen confirming out an adequate decompression.  Once this size was complete I went to the contralateral side and using the same technique of Kerrison rongeurs and gentle dissection with the Penfield I decompressed the central and lateral recess on the left side.  Again the medial facetectomy was performed to adequately decompress the left L5 nerve root in the foramen and I did a foraminotomy as well.  Once I had adequate central and lateral recess and foraminal decompression we irrigated the wound copiously with normal saline.  Using bipolar electrocautery I coagulated the epidural veins and used FloSeal.  Final irrigation was done and then I elected to close the wound over a drain.  The deep fascia was closed with interrupted #  1 Vicryl sutures, and a layer of 0 Vicryl and then interrupted 2-0 Vicryl suture and a 3-0 Monocryl for the skin.  Steri-Strips and dry dressing were applied and the patient was elderly extubated transfer the PACU that incident.  The end of the case all needle sponge  counts were correct.  There are no adverse intraoperative events.

## 2017-10-31 NOTE — Progress Notes (Signed)
Dr. Fransisco Beau notified of pts DC from [place], [place], RN

## 2017-10-31 NOTE — Anesthesia Postprocedure Evaluation (Signed)
Anesthesia Post Note  Patient: Vanessa Dean  Procedure(s) Performed: LUMBAR LAMINECTOMY/DECOMPRESSION MICRODISCECTOMY ONE LEVEL  LUMBAR FOUR - LUMBAR FIVE DECOMPRESSION (N/A Spine Lumbar)     Patient location during evaluation: PACU Anesthesia Type: General Level of consciousness: awake and alert Pain management: pain level controlled Vital Signs Assessment: post-procedure vital signs reviewed and stable Respiratory status: spontaneous breathing, nonlabored ventilation and respiratory function stable Cardiovascular status: blood pressure returned to baseline and stable Postop Assessment: no apparent nausea or vomiting Anesthetic complications: no    Last Vitals:  Vitals:   10/31/17 1200 10/31/17 1222  BP: 127/72   Pulse: 80 76  Resp: (!) 25 18  Temp:  36.4 C  SpO2: 96% 98%    Last Pain:  Vitals:   10/31/17 1222  TempSrc: Oral  PainSc:                  [name]

## 2017-10-31 NOTE — Brief Op Note (Signed)
10/31/2017  12:04 PM  PATIENT:  Vanessa Dean  52 y.o. female  PRE-OPERATIVE DIAGNOSIS:  Lumbar spondylolisthesis with spinal stenosis  POST-OPERATIVE DIAGNOSIS:  Lumbar spondylolisthesis with spinal stenosis  PROCEDURE:  Procedure(s) with comments: LUMBAR LAMINECTOMY/DECOMPRESSION MICRODISCECTOMY ONE LEVEL  LUMBAR FOUR - LUMBAR FIVE DECOMPRESSION (N/A) - LUMBAR LAMINECTOMY/DECOMPRESSION MICRODISCECTOMY ONE LEVEL  LUMBAR FOUR - LUMBAR FIVE DECOMPRESSION  SURGEON:  Surgeon(s) and Role:    Melina Schools, MD - Primary  PHYSICIAN ASSISTANT:   ASSISTANTS: Carmen Mayo   ANESTHESIA:   general  EBL:  50 mL   BLOOD ADMINISTERED:none  DRAINS: JP   LOCAL MEDICATIONS USED:  MARCAINE     SPECIMEN:  No Specimen  DISPOSITION OF SPECIMEN:  N/A  COUNTS:  YES  TOURNIQUET:  * No tourniquets in log *  DICTATION: .Dragon Dictation  PLAN OF CARE: Admit for overnight observation  PATIENT DISPOSITION:  PACU - hemodynamically stable.

## 2017-10-31 NOTE — Discharge Instructions (Signed)
Lumbar Diskectomy, Care After Refer to this sheet in the next few weeks. These instructions provide you with information about caring for yourself after your procedure. Your health care provider may also give you more specific instructions. Your treatment has been planned according to current medical practices, but problems sometimes occur. Call your health care provider if you have any problems or questions after your procedure. What can I expect after the procedure? After the procedure, it is common to have:  Pain.  Numbness.  Weakness.  Follow these instructions at home: Medicines  Take medicines only as directed by your health care provider.  If you were prescribed an antibiotic medicine, finish all of it even if you start to feel better. Incision care  There are many different ways to close and cover an incision, including stitches (sutures), skin glue, and adhesive strips. Follow your health care provider's instructions about: ? Incision care. ? Bandage (dressing) changes and removal. ? Incision closure removal.  Check your incision area every day for signs of infection. If you cannot see your incision, have someone check it for you. Watch for: ? Redness, swelling, or pain. ? Fluid, blood, or pus. Activity  Avoid sitting for longer than 20 minutes at a time or as directed by your health care provider.  Do not climb stairs more than once each day until your health care provider approves.  Do not bend at your waist. To pick things up, bend your knees.  Do not lift anything that is heavier than 10 lb (4.5 kg) or as directed by your health care provider.  Do not drive a car until your health care provider approves.  Ask your health care provider when you may return to your normal activities, such as playing sports and going back to work.  Work with your physical therapist to learn safe movement and exercises to help healing. Do these exercises as directed.  Take short  walks often. General instructions  Do not use any tobacco products, including cigarettes, chewing tobacco, or electronic cigarettes. If you need help quitting, ask your health care provider.  Follow your health care providers instructions about bathing. Do not take baths, shower, swim, or use a hot tub until your health care provider approves.  Wear your back brace as directed by your health care provider.  To prevent constipation: ? Drink enough fluid to keep your urine clear or pale yellow. ? Eat plenty of fruits, vegetables, and whole grains.  Keep all follow-up visits as directed by your health care provider. This is important. This includes any follow-up visits with your physical therapist. Contact a health care provider if:  You have a fever.  You have redness, swelling, or pain in your incision area.  Your pain is not controlled with medicine.  You have pain, numbness, or weakness that lasts longer than three weeks after surgery.  You become constipated. Get help right away if:  You have fluid, blood, or pus coming from your incision.  You have increasing pain, numbness, or weakness.  You lose control of when you urinate or have a bowel movement (incontinence).  You have chest pain.  You have trouble breathing. This information is not intended to replace advice given to you by your health care provider. Make sure you discuss any questions you have with your health care provider. Document Released: 01/26/2004 Document Revised: 07/29/2015 Document Reviewed: 10/15/2013 Elsevier Interactive Patient Education  [employer].

## 2017-10-31 NOTE — Evaluation (Signed)
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   Clinical Impression  Patient evaluated by Physical Therapy with no further acute PT needs identified. Demonstrates good pain control and activity tolerance. ession focused on stair training (pt previously ambulated 300 feet with OT in hallway). Pt able to negotiate 12 steps with left railing to simulate home environment at a supervision level. All education has been completed and the patient has no further questions. No follow-up Physical Therapy or equipment needs. PT is signing off. Thank you for this referral.     Follow Up Recommendations No PT follow up    Equipment Recommendations  None recommended by PT    Recommendations for Other Services       Precautions / Restrictions Precautions Precautions: Back Precaution Comments: verbally reviewed with pt Required Braces or Orthoses: Spinal Brace Spinal Brace: Lumbar corset;Applied in sitting position Restrictions Weight Bearing Restrictions: No      Mobility  Bed Mobility Overal bed mobility: Needs Assistance Bed Mobility: Rolling;Sit to Sidelying Rolling: Modified independent (Device/Increase time) Sidelying to sit: Supervision       General bed mobility comments: vc for reminder of log roll technique  Transfers Overall transfer level: Needs assistance Equipment used: None Transfers: Sit to/from [employer] Transfers Sit to Stand: Min guard Stand pivot transfers: Supervision       General transfer comment: increased time to achieve knee extension  Ambulation/Gait Ambulation/Gait assistance: Supervision Gait Distance (Feet): 30 Feet Assistive device: None Gait Pattern/deviations: Step-through  pattern;Decreased stride length Gait velocity: decr   General Gait Details: Distance not a focus as pt had previously ambulated 300 feet with OT. slow speed but no imbalance noted  Stairs Stairs: Yes Stairs assistance: Supervision Stair Management: One rail Left Number of Stairs: 12 General stair comments: step by step pattern and cues for sequencing  Wheelchair Mobility    Modified Rankin (Stroke Patients Only)       Balance Overall balance assessment: Mild deficits observed, not formally tested                                           Pertinent Vitals/Pain Pain Assessment: 0-10 Pain Score: 4  Pain Location: L back Pain Descriptors / Indicators: Aching Pain Intervention(s): Monitored during session    Home Living Family/patient expects to be discharged to:: Private residence Living Arrangements: Spouse/significant other;Children Available Help at Discharge: Family Type of Home: House Home Access: Stairs to enter     Home Layout: Two level;Bed/bath upstairs Home Equipment: None      Prior Function Level of Independence: Independent               Hand Dominance   Dominant Hand: Right    Extremity/Trunk Assessment   Upper Extremity Assessment Upper Extremity Assessment: Overall WFL for tasks assessed    Lower Extremity Assessment Lower Extremity Assessment: Overall WFL for tasks assessed    Cervical / Trunk Assessment Cervical / Trunk Assessment: Other exceptions Cervical / Trunk Exceptions: s/p spinal sx  Communication   Communication: No difficulties  Cognition Arousal/Alertness: Awake/alert Behavior During Therapy: WFL for tasks assessed/performed Overall Cognitive Status: Within Functional Limits for tasks assessed  General Comments      Exercises     Assessment/Plan    PT Assessment Patent does not need any further PT services  PT Problem List          PT Treatment Interventions      PT Goals (Current goals can be found in the Care Plan section)  Acute Rehab PT Goals Patient Stated Goal: get back to me PT Goal Formulation: All assessment and education complete, DC therapy    Frequency     Barriers to discharge        Co-evaluation               AM-PAC PT "6 Clicks" Daily Activity  Outcome Measure Difficulty turning over in bed (including adjusting bedclothes, sheets and blankets)?: A Little Difficulty moving from lying on back to sitting on the side of the bed? : A Little Difficulty sitting down on and standing up from a chair with arms (e.g., wheelchair, bedside commode, etc,.)?: A Little Help needed moving to and from a bed to chair (including a wheelchair)?: A Little Help needed walking in hospital room?: A Little Help needed climbing 3-5 steps with a railing? : A Little 6 Click Score: 18    End of Session Equipment Utilized During Treatment: Gait belt;Back brace Activity Tolerance: Patient tolerated treatment well Patient left: in bed;with call bell/phone within reach;with family/visitor present Nurse Communication: Mobility status PT Visit Diagnosis: Unsteadiness on feet (R26.81);Pain Pain - part of body: (back)    Time: 1914-7829 PT Time Calculation (min) (ACUTE ONLY): 13 min   Charges:   PT Evaluation $PT Eval Low Complexity: 1 Low          Ellamae Sia, PT, DPT Acute Rehabilitation Services  Pager: [place] 10/31/2017, 4:59 PM

## 2017-11-01 ENCOUNTER — Encounter (HOSPITAL_COMMUNITY): Payer: Self-pay | Admitting: Orthopedic Surgery

## 2017-11-01 DIAGNOSIS — M48062 Spinal stenosis, lumbar region with neurogenic claudication: Secondary | ICD-10-CM | POA: Diagnosis not present

## 2017-11-01 MED FILL — Thrombin (Recombinant) For Soln 20000 Unit: CUTANEOUS | Qty: 1 | Status: AC

## 2017-11-01 NOTE — Progress Notes (Signed)
    Subjective: 1 Day Post-Op Procedure(s) (LRB): LUMBAR LAMINECTOMY/DECOMPRESSION MICRODISCECTOMY ONE LEVEL  LUMBAR FOUR - LUMBAR FIVE DECOMPRESSION (N/A) Patient reports pain as 3 on 0-10 scale.   Denies CP or SOB.  Voiding without difficulty. Positive flatus. Objective: Vital signs in last 24 hours: Temp:  [97 F (36.1 C)-100.2 F (37.9 C)] 98.3 F (36.8 C) (08/29 0316) Pulse Rate:  [62-102] 62 (08/29 0316) Resp:  [18-32] 20 (08/29 0316) BP: (107-152)/(56-84) 115/70 (08/29 0316) SpO2:  [92 %-98 %] 92 % (08/29 0316) Weight:  [271 kg] 124 kg (08/28 1242)  Intake/Output from previous day: 08/28 0701 - 08/29 0700 In: 1648.9 [P.O.:480; I.V.:1068.9; IV Piggyback:100] Out: 150 [Drains:100; Blood:50] Intake/Output this shift: No intake/output data recorded.  Labs: No results for input(s): HGB in the last 72 hours. No results for input(s): WBC, RBC, HCT, PLT in the last 72 hours. No results for input(s): NA, K, CL, CO2, BUN, CREATININE, GLUCOSE, CALCIUM in the last 72 hours. No results for input(s): LABPT, INR in the last 72 hours.  Physical Exam: Neurologically intact ABD soft Sensation intact distally Dorsiflexion/Plantar flexion intact Incision: drain removed 15 cc this am Compartment soft Body mass index is 45.49 kg/m.   Assessment/Plan: 1 Day Post-Op Procedure(s) (LRB): LUMBAR LAMINECTOMY/DECOMPRESSION MICRODISCECTOMY ONE LEVEL  LUMBAR FOUR - LUMBAR FIVE DECOMPRESSION (N/A) Advance diet Up with therapy  Pt may DC after cleared by PT Meds on chart  Teddrick Mallari, Darla Lesches for Dr. Melina Schools [facility] [phone] 11/01/2017, 7:19 AM

## 2017-11-01 NOTE — Progress Notes (Signed)
Patient is discharged from room 3C07 at this time. Alert and in stable condition. IV site d/c'd and instructions read to patient and spouse with understanding verbalized. Left unit via wheelchair with all belongings at side.

## 2017-11-01 NOTE — Progress Notes (Signed)
Vanessa Dean had surgical intervention yesterday.  Please excuse the pts daughter from school.  Ronette Deter, [facility]

## 2017-11-08 ENCOUNTER — Other Ambulatory Visit: Payer: Self-pay

## 2017-11-08 ENCOUNTER — Encounter: Payer: Self-pay | Admitting: Family Medicine

## 2017-11-08 ENCOUNTER — Ambulatory Visit: Payer: BC Managed Care – PPO | Admitting: Family Medicine

## 2017-11-08 VITALS — BP 145/80 | HR 75 | Temp 98.2°F | Ht 65.0 in | Wt 273.0 lb

## 2017-11-08 DIAGNOSIS — T7840XA Allergy, unspecified, initial encounter: Secondary | ICD-10-CM

## 2017-11-08 DIAGNOSIS — L299 Pruritus, unspecified: Secondary | ICD-10-CM | POA: Diagnosis not present

## 2017-11-08 MED ORDER — HYDROXYZINE HCL 25 MG PO TABS: 12.5000 mg | ORAL_TABLET | Freq: Four times a day (QID) | ORAL | 0 refills | Status: DC | PRN

## 2017-11-08 MED ORDER — PREDNISONE 20 MG PO TABS: 40.0000 mg | ORAL_TABLET | Freq: Every day | ORAL | 0 refills | Status: DC

## 2017-11-08 NOTE — Patient Instructions (Addendum)
I suspect the itching was due to the oxycodone.  That should improve as that medicine comes out of your system.  Okay to start prednisone 2 pills/day for the next 3 days, but make sure you clear that with your surgeon.  Hydroxyzine every 6 hours as needed, can start with lower dose of 1/2 pill all the way up to 1 pill every 6 hours.  If any new rash in the mouth, genitalia, trouble breathing or trouble swallowing be seen right away.  Return to the clinic or go to the nearest emergency room if any of your symptoms worsen or new symptoms occur.   Rash A rash is a change in the color of the skin. A rash can also change the way your skin feels. There are many different conditions and factors that can cause a rash. Follow these instructions at home: Pay attention to any changes in your symptoms. Follow these instructions to help with your condition: Medicine Take or apply over-the-counter and prescription medicines only as told by your doctor. These may include:  Corticosteroid cream.  Anti-itch lotions.  Oral antihistamines.  Skin Care  Put cool compresses on the affected areas.  Try taking a bath with: ? Epsom salts. Follow the instructions on the packaging. You can get these at your local pharmacy or grocery store. ? Baking soda. Pour a small amount into the bath as told by your doctor. ? Colloidal oatmeal. Follow the instructions on the packaging. You can get this at your local pharmacy or grocery store.  Try putting baking soda paste onto your skin. Stir water into baking soda until it gets like a paste.  Do not scratch or rub your skin.  Avoid covering the rash. Make sure the rash is exposed to air as much as possible. General instructions  Avoid hot showers or baths, which can make itching worse. A cold shower may help.  Avoid scented soaps, detergents, and perfumes. Use gentle soaps, detergents, perfumes, and other cosmetic products.  Avoid anything that causes your rash.  Keep a journal to help track what causes your rash. Write down: ? What you eat. ? What cosmetic products you use. ? What you drink. ? What you wear. This includes jewelry.  Keep all follow-up visits as told by your doctor. This is important. Contact a doctor if:  You sweat at night.  You lose weight.  You pee (urinate) more than normal.  You feel weak.  You throw up (vomit).  Your skin or the whites of your eyes look yellow (jaundice).  Your skin: ? Tingles. ? Is numb.  Your rash: ? Does not go away after a few days. ? Gets worse.  You are: ? More thirsty than normal. ? More tired than normal.  You have: ? New symptoms. ? Pain in your belly (abdomen). ? A fever. ? Watery poop (diarrhea). Get help right away if:  Your rash covers all or most of your body. The rash may or may not be painful.  You have blisters that: ? Are on top of the rash. ? Grow larger. ? Grow together. ? Are painful. ? Are inside your nose or mouth.  You have a rash that: ? Looks like purple pinprick-sized spots all over your body. ? Has a "bull's eye" or looks like a target. ? Is red and painful, causes your skin to peel, and is not from being in the sun too long. This information is not intended to replace advice given to you by  your health care provider. Make sure you discuss any questions you have with your health care provider. Document Released: 08/09/2007 Document Revised: 07/29/2015 Document Reviewed: 07/08/2014 Elsevier Interactive Patient Education  [employer].   If you have lab work done today you will be contacted with your lab results within the next 2 weeks.  If you have not heard from [place] then please contact us. The fastest way to get your results is to register for My Chart.   IF you received an x-ray today, you will receive an invoice from [facility]. Please contact [facility] at [phone] with questions or concerns regarding your invoice.    IF you received labwork today, you will receive an invoice from [facility]. Please contact [facility] at [phone] with questions or concerns regarding your invoice.   Our billing staff will not be able to assist you with questions regarding bills from these companies.  You will be contacted with the lab results as soon as they are available. The fastest way to get your results is to activate your My Chart account. Instructions are located on the last page of this paperwork. If you have not heard from [place] regarding the results in 2 weeks, please contact this office.

## 2017-11-08 NOTE — Progress Notes (Signed)
Subjective:    Patient ID: Vanessa Dean, female    DOB: 1965/10/13, 52 y.o.   MRN: [id number]  HPI Vanessa Dean is a 52 y.o. female Presents today for: Chief Complaint  Patient presents with  . itching all over    Mostly the trunk of the body and arms (noticed it since sunday )   Started with rash 5 days ago - started on back of neck at hairline. Then to chest, shoulders, sides of flank, thighs, stomach and arms. Has been taking oxycodone after surgery (10/31/17 - lumbar discectomy).  Initially QAM. QPM. Ondansetron once  and methocarbamol (BID) other new meds.  Stopped methocarbamol.  No dyspnea, difficulty swallowing.  No tongue or face swelling.   Switched to hydrocodone, but not yet taken. Last oxycodone 2 days ago, same with robaxin.   Tx: benadryl past 2 days. 2 pills since yesterday.    Patient Active Problem List   Diagnosis Date Noted  . 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 10/31/2017   Past Medical History:  Diagnosis Date  . Anemia   . Arthritis   . Crohn disease ([place])   . Fluid retention in legs   . Hypertension   . PONV (postoperative nausea and vomiting)   . Pre-diabetes   . Seasonal allergies    TAKES ZYRTEC AS NEEDED  . Sleep apnea    cpap at night  . Snores    Past Surgical History:  Procedure Laterality Date  . ABDOMINAL HYSTERECTOMY    . APPENDECTOMY    . CARPAL TUNNEL RELEASE  05/16/2011   Procedure: CARPAL TUNNEL RELEASE;  Surgeon: Cammie Sickle., MD;  Location: [facility];  Service: Orthopedics;  Laterality: Left;  . CARPAL TUNNEL RELEASE  08/08/2011   Procedure: CARPAL TUNNEL RELEASE;  Surgeon: Cammie Sickle., MD;  Location: [facility];  Service: Orthopedics;  Laterality: Right;  . CHOLECYSTECTOMY    . COLON SURGERY     patient had intestinal blockage  . COLONOSCOPY    . ESOPHAGOGASTRODUODENOSCOPY    . GASTRIC BYPASS    . KNEE ARTHROSCOPY  05/09/2010   right   . KNEE  ARTHROSCOPY  05/10/2007   left  . LUMBAR LAMINECTOMY/DECOMPRESSION MICRODISCECTOMY N/A 10/31/2017   Procedure: LUMBAR LAMINECTOMY/DECOMPRESSION MICRODISCECTOMY ONE LEVEL  LUMBAR FOUR - LUMBAR FIVE DECOMPRESSION;  Surgeon: Melina Schools, MD;  Location: [facility];  Service: Orthopedics;  Laterality: N/A;  LUMBAR LAMINECTOMY/DECOMPRESSION MICRODISCECTOMY ONE LEVEL  LUMBAR FOUR - LUMBAR FIVE DECOMPRESSION  . TOTAL VAGINAL HYSTERECTOMY  07/20/2005   posterior colporrhaphy   No Known Allergies Prior to Admission medications   Medication Sig Start Date End Date Taking? Authorizing Provider  Ascorbic Acid (VITAMIN C PO) Take 1 tablet by mouth.   Yes [provider]  atorvastatin (LIPITOR) 10 MG tablet Take 10 mg by mouth every evening. 10/10/17  Yes [provider]  gabapentin (NEURONTIN) 300 MG capsule Take 300 mg by mouth every evening. 10/01/17  Yes [provider]  MAGNESIUM PO Take 1 tablet by mouth.   Yes [provider]  methocarbamol (ROBAXIN) 500 MG tablet Take 1 tablet (500 mg total) by mouth 3 (three) times daily. Patient not taking: Reported on 11/08/2017 10/31/17   Mayo, Darla Lesches, PA-C  Multiple Vitamins-Minerals (HAIR SKIN AND NAILS FORMULA PO) Take 1 tablet by mouth.    [provider]  Multiple Vitamins-Minerals (MULTIVITAMIN WITH MINERALS) tablet Take 1 tablet by mouth daily.  [provider]  ondansetron (ZOFRAN ODT) 4 MG disintegrating tablet Take 1 tablet (4 mg total) by mouth every 8 (eight) hours as needed for nausea or vomiting. Patient not taking: Reported on 11/08/2017 10/31/17   Mayo, Darla Lesches, PA-C  valsartan-hydrochlorothiazide (DIOVAN-HCT) 320-25 MG tablet Take 1 tablet by mouth daily. 07/23/17   [provider]   Social History   Socioeconomic History  . Marital status: Married    Spouse name: Not on file  . Number of children: Not on file  . Years of education: Not on file  . Highest education level:  Not on file  Occupational History  . Not on file  Social Needs  . Financial resource strain: Not on file  . Food insecurity:    Worry: Not on file    Inability: Not on file  . Transportation needs:    Medical: Not on file    Non-medical: Not on file  Tobacco Use  . Smoking status: Never Smoker  . Smokeless tobacco: Never Used  Substance and Sexual Activity  . Alcohol use: Yes    Comment: SOCIAL  . Drug use: No  . Sexual activity: Yes  Lifestyle  . Physical activity:    Days per week: Not on file    Minutes per session: Not on file  . Stress: Not on file  Relationships  . Social connections:    Talks on phone: Not on file    Gets together: Not on file    Attends religious service: Not on file    Active member of club or organization: Not on file    Attends meetings of clubs or organizations: Not on file    Relationship status: Not on file  . Intimate partner violence:    Fear of current or ex partner: Not on file    Emotionally abused: Not on file    Physically abused: Not on file    Forced sexual activity: Not on file  Other Topics Concern  . Not on file  Social History Narrative  . Not on file    Review of Systems  HENT: Negative for trouble swallowing.   Respiratory: Negative for chest tightness, shortness of breath and wheezing.   Skin: Positive for color change and rash.       Objective:   Physical Exam  Constitutional: She is oriented to person, place, and time. She appears well-developed and well-nourished. No distress.  HENT:  Head: Normocephalic and atraumatic.  Mouth/Throat: Mucous membranes are normal.  No oral lesions/rash  Cardiovascular: Normal rate and regular rhythm.  Pulmonary/Chest: Effort normal. No stridor. No respiratory distress. She has no wheezes. She has no rales.  Neurological: She is alert and oriented to person, place, and time.  Skin: Skin is warm and dry. Abrasion and rash (Few faint erythematous patches across the volar  forearms, upper back, posterior neck, lower abdomen, upper thighs.  Primarily has linear areas of excoriation.  No large urticarial lesions seen. ) noted. No petechiae noted. There is erythema.  Psychiatric: She has a normal mood and affect.   Vitals:   11/08/17 1506  BP: (!) 145/80  Pulse: 75  Temp: 98.2 F (36.8 C)  TempSrc: Oral  SpO2: 97%  Weight: 273 lb (123.8 kg)  Height: 5' 5"  (1.651 m)          Assessment & Plan:    Vanessa Dean is a 52 y.o. female Allergic reaction, initial encounter - Plan: hydrOXYzine (ATARAX/VISTARIL) 25 MG tablet, predniSONE (DELTASONE)  20 MG tablet  Pruritus - Plan: hydrOXYzine (ATARAX/VISTARIL) 25 MG tablet, predniSONE (DELTASONE) 20 MG tablet  Suspected side effects from oxycodone/reaction.  No mucosal involvement, no respiratory involvement.  Has stopped oxycodone.  -Hydroxyzine 12.5 to 25 mg every 6 hours as needed itching, prednisone 40 mg daily for 3 days, but advised to clear this medication with her surgeon.  -RTC/ER precautions if worsening/persistent  Meds ordered this encounter  Medications  . hydrOXYzine (ATARAX/VISTARIL) 25 MG tablet    Sig: Take 0.5-1 tablets (12.5-25 mg total) by mouth every 6 (six) hours as needed for itching.    Dispense:  30 tablet    Refill:  0  . predniSONE (DELTASONE) 20 MG tablet    Sig: Take 2 tablets (40 mg total) by mouth daily with breakfast.    Dispense:  6 tablet    Refill:  0   Patient Instructions   I suspect the itching was due to the oxycodone.  That should improve as that medicine comes out of your system.  Okay to start prednisone 2 pills/day for the next 3 days, but make sure you clear that with your surgeon.  Hydroxyzine every 6 hours as needed, can start with lower dose of 1/2 pill all the way up to 1 pill every 6 hours.  If any new rash in the mouth, genitalia, trouble breathing or trouble swallowing be seen right away.  Return to the clinic or go to the nearest emergency room if any  of your symptoms worsen or new symptoms occur.   Rash A rash is a change in the color of the skin. A rash can also change the way your skin feels. There are many different conditions and factors that can cause a rash. Follow these instructions at home: Pay attention to any changes in your symptoms. Follow these instructions to help with your condition: Medicine Take or apply over-the-counter and prescription medicines only as told by your doctor. These may include:  Corticosteroid cream.  Anti-itch lotions.  Oral antihistamines.  Skin Care  Put cool compresses on the affected areas.  Try taking a bath with: ? Epsom salts. Follow the instructions on the packaging. You can get these at your local pharmacy or grocery store. ? Baking soda. Pour a small amount into the bath as told by your doctor. ? Colloidal oatmeal. Follow the instructions on the packaging. You can get this at your local pharmacy or grocery store.  Try putting baking soda paste onto your skin. Stir water into baking soda until it gets like a paste.  Do not scratch or rub your skin.  Avoid covering the rash. Make sure the rash is exposed to air as much as possible. General instructions  Avoid hot showers or baths, which can make itching worse. A cold shower may help.  Avoid scented soaps, detergents, and perfumes. Use gentle soaps, detergents, perfumes, and other cosmetic products.  Avoid anything that causes your rash. Keep a journal to help track what causes your rash. Write down: ? What you eat. ? What cosmetic products you use. ? What you drink. ? What you wear. This includes jewelry.  Keep all follow-up visits as told by your doctor. This is important. Contact a doctor if:  You sweat at night.  You lose weight.  You pee (urinate) more than normal.  You feel weak.  You throw up (vomit).  Your skin or the whites of your eyes look yellow (jaundice).  Your skin: ? Tingles. ? Is numb.  Your  rash: ? Does not go away after a few days. ? Gets worse.  You are: ? More thirsty than normal. ? More tired than normal.  You have: ? New symptoms. ? Pain in your belly (abdomen). ? A fever. ? Watery poop (diarrhea). Get help right away if:  Your rash covers all or most of your body. The rash may or may not be painful.  You have blisters that: ? Are on top of the rash. ? Grow larger. ? Grow together. ? Are painful. ? Are inside your nose or mouth.  You have a rash that: ? Looks like purple pinprick-sized spots all over your body. ? Has a "bull's eye" or looks like a target. ? Is red and painful, causes your skin to peel, and is not from being in the sun too long. This information is not intended to replace advice given to you by your health care provider. Make sure you discuss any questions you have with your health care provider. Document Released: 08/09/2007 Document Revised: 07/29/2015 Document Reviewed: 07/08/2014 Elsevier Interactive Patient Education  [employer].   If you have lab work done today you will be contacted with your lab results within the next 2 weeks.  If you have not heard from [place] then please contact us. The fastest way to get your results is to register for My Chart.   IF you received an x-ray today, you will receive an invoice from [facility]. Please contact [facility] at [phone] with questions or concerns regarding your invoice.   IF you received labwork today, you will receive an invoice from [facility]. Please contact [facility] at [phone] with questions or concerns regarding your invoice.   Our billing staff will not be able to assist you with questions regarding bills from these companies.  You will be contacted with the lab results as soon as they are available. The fastest way to get your results is to activate your My Chart account. Instructions are located on the last page of this paperwork. If you have not  heard from [place] regarding the results in 2 weeks, please contact this office.       I personally performed the services described in this documentation, which was scribed in my presence. The recorded information has been reviewed and considered for accuracy and completeness, addended by me as needed, and agree with information above.  Signed,   Merri Ray, MD [facility].  11/10/17 11:43 AM

## 2017-11-10 ENCOUNTER — Encounter: Payer: Self-pay | Admitting: Family Medicine

## 2017-11-14 NOTE — Discharge Summary (Signed)
Physician Discharge Summary  Patient ID: Vanessa Dean MRN: [id number] DOB/AGE: 04/20/1965 52 y.o.  Admit date: 10/31/2017 Discharge date: 11/01/17  Admission Diagnoses:  Lumbar spondylolisthesis with spinal stenosis  Discharge Diagnoses:  Active Problems:   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   Past Medical History:  Diagnosis Date  . Anemia   . Arthritis   . Crohn disease ([place])   . Fluid retention in legs   . Hypertension   . PONV (postoperative nausea and vomiting)   . Pre-diabetes   . Seasonal allergies    TAKES ZYRTEC AS NEEDED  . Sleep apnea    cpap at night  . Snores     Surgeries: Procedure(s): LUMBAR LAMINECTOMY/DECOMPRESSION MICRODISCECTOMY ONE LEVEL  LUMBAR FOUR - LUMBAR FIVE DECOMPRESSION on 10/31/2017   Consultants (if any):   Discharged Condition: Improved  Hospital Course: Vanessa Dean is an 52 y.o. female who was admitted 10/31/2017 with a diagnosis of Lumbar spondylolisthesis with spinal stenosis and went to the operating room on 10/31/2017 and underwent the above named procedures.  Post op day on pt reports low level of pain controlled on oral medication.  Pt is ambulating in hallway.  Pt is urinating w/o difficulty. Pt cleared by PT for DC.   She was given perioperative antibiotics:  Anti-infectives (From admission, onward)   Start     Dose/Rate Route Frequency Ordered Stop   10/31/17 1230  ceFAZolin (ANCEF) IVPB 2g/100 mL premix     2 g 200 mL/hr over 30 Minutes Intravenous Every 8 hours 10/31/17 1218 11/01/17 0748   10/31/17 0630  ceFAZolin (ANCEF) 3 g in dextrose 5 % 50 mL IVPB     3 g 100 mL/hr over 30 Minutes Intravenous To ShortStay Surgical 10/30/17 1055 10/31/17 1304    .  She was given sequential compression devices, early ambulation, and TED for DVT prophylaxis.  She benefited maximally from the hospital stay and there were no complications.    Recent vital signs:  Vitals:   11/01/17 0316 11/01/17  0740  BP: 115/70 102/60  Pulse: 62 60  Resp: 20 18  Temp: 98.3 F (36.8 C) 98.7 F (37.1 C)  SpO2: 92% 95%    Recent laboratory studies:  Lab Results  Component Value Date   HGB 12.5 10/24/2017   HGB 13.5 05/16/2011   HGB 13.3 05/09/2010   Lab Results  Component Value Date   WBC 8.0 10/24/2017   PLT 299 10/24/2017   No results found for: INR Lab Results  Component Value Date   NA 142 10/24/2017   K 3.4 (L) 10/24/2017   CL 103 10/24/2017   CO2 28 10/24/2017   BUN 13 10/24/2017   CREATININE 1.14 (H) 10/24/2017   GLUCOSE 93 10/24/2017    Discharge Medications:   Allergies as of 11/01/2017   No Known Allergies     Medication List    STOP taking these medications   diclofenac 75 MG EC tablet Commonly known as:  VOLTAREN   HYDROcodone-acetaminophen 5-325 MG tablet Commonly known as:  NORCO/VICODIN   meloxicam 15 MG tablet Commonly known as:  MOBIC     TAKE these medications   atorvastatin 10 MG tablet Commonly known as:  LIPITOR Take 10 mg by mouth every evening.   gabapentin 300 MG capsule Commonly known as:  NEURONTIN Take 300 mg by mouth every evening.   MAGNESIUM PO Take 1 tablet by mouth.   methocarbamol 500 MG tablet Commonly known as:  ROBAXIN Take 1 tablet (500 mg total) by mouth 3 (three) times daily.   multivitamin with minerals tablet Take 1 tablet by mouth daily.   HAIR SKIN AND NAILS FORMULA PO Take 1 tablet by mouth.   ondansetron 4 MG disintegrating tablet Commonly known as:  ZOFRAN-ODT Take 1 tablet (4 mg total) by mouth every 8 (eight) hours as needed for nausea or vomiting.   valsartan-hydrochlorothiazide 320-25 MG tablet Commonly known as:  DIOVAN-HCT Take 1 tablet by mouth daily.   VITAMIN C PO Take 1 tablet by mouth.     ASK your doctor about these medications   oxyCODONE-acetaminophen 10-325 MG tablet Commonly known as:  PERCOCET Take 1 tablet by mouth every 4 (four) hours as needed for up to 5 days for pain. Ask  about: Should I take this medication?       Diagnostic Studies: Dg Lumbar Spine 2-3 Views  Result Date: 10/31/2017 CLINICAL DATA:  Surgery, elective. EXAM: LUMBAR SPINE - 2-3 VIEW COMPARISON:  CT of the abdomen and pelvis 06/09/2015. FINDINGS: Three intraoperative views of the lumbar spine are submitted. The first image demonstrates needles at the L2 and L4 spinous processes. The second image demonstrates a surgical probe directed at the L3-4 disc space. The final image demonstrates a surgical probe directed at the L4-5 disc space. IMPRESSION: Intraoperative localization of the L4-5 lumbar disc space. These results were called by telephone at the time of interpretation on 10/31/2017 at 8:41 am to Dr. Melina Schools , who verbally acknowledged these results. Electronically Signed   By: San Morelle M.D.   On: 10/31/2017 08:42    Disposition:  Post op meds provided Pt will present to clinic in 2 weeks  Discharge Instructions    Incentive spirometry RT   Complete by:  As directed       Follow-up Information    Melina Schools, MD Follow up in 2 week(s).   Specialty:  Orthopedic Surgery Contact information: [address] [place] [address] [phone]            Signed: Valinda Hoar 11/14/2017, 8:02 AM

## 2018-10-08 ENCOUNTER — Encounter: Payer: Self-pay | Admitting: Plastic Surgery

## 2018-10-08 ENCOUNTER — Other Ambulatory Visit: Payer: Self-pay

## 2018-10-08 ENCOUNTER — Ambulatory Visit (INDEPENDENT_AMBULATORY_CARE_PROVIDER_SITE_OTHER): Payer: BC Managed Care – PPO | Admitting: Plastic Surgery

## 2018-10-08 VITALS — BP 144/91 | HR 79 | Temp 97.5°F | Ht 65.0 in | Wt 282.0 lb

## 2018-10-08 DIAGNOSIS — G8929 Other chronic pain: Secondary | ICD-10-CM

## 2018-10-08 DIAGNOSIS — M542 Cervicalgia: Secondary | ICD-10-CM | POA: Diagnosis not present

## 2018-10-08 DIAGNOSIS — M546 Pain in thoracic spine: Secondary | ICD-10-CM | POA: Diagnosis not present

## 2018-10-08 DIAGNOSIS — M549 Dorsalgia, unspecified: Secondary | ICD-10-CM | POA: Insufficient documentation

## 2018-10-08 DIAGNOSIS — Z9889 Other specified postprocedural states: Secondary | ICD-10-CM

## 2018-10-08 DIAGNOSIS — N62 Hypertrophy of breast: Secondary | ICD-10-CM

## 2018-10-08 NOTE — Progress Notes (Signed)
Patient ID: Vanessa Dean, female    DOB: 07/05/1965, 53 y.o.   MRN: [id number]   Chief Complaint  Patient presents with  . Advice Only    for (B) breast reduction  . Breast Problem    Mammary Hyperplasia: The patient is a 53 y.o. female with a history of mammary hyperplasia for several years.  She has extremely large breasts causing symptoms that include the following: Back pain (upper and lower) and neck pain. She frequently pins bra cups higher on straps for better lift and relief. Notices relief when holding breast up in her hands. Shoulder straps causing grooves, pain occasionally requiring padding. Pain medication is sometimes required with motrin and tylenol.  Activities that are hindered by enlarged breasts include: Running and exercise her daughter was recently diagnosed with lupus and she is needing to care for her.  She is finding this difficult due to the back and neck pain.  Her breasts are extremely large and fairly symmetric.  She has hyperpigmentation of the inframammary area on both sides.  The sternal to nipple distance on the right is 42 cm and the left is 44 cm.  The IMF distance is 19 cm.  She is 5 feet 5 inches tall and weighs 282 pounds.  Preoperative bra size = 40 H cup. She would like to be a D or DD.  The estimated excess breast tissue to be removed at the time of surgery = 1200 grams on the left and 1200 grams on the right.  Mammogram history: 2018 and needs an updated one.  She has had neck surgery and is currently in physical therapy again.  Physical therapy is not helping the upper back pain.  This is most likely related to the excess breast tissue.  She has had multiple surgeries that are listed below.   Review of Systems  Constitutional: Positive for activity change. Negative for appetite change.  HENT: Negative.   Eyes: Negative.  Negative for discharge.  Respiratory: Negative.  Negative for chest tightness and shortness of breath.   Cardiovascular:  Negative for leg swelling.  Gastrointestinal: Negative for abdominal pain.  Endocrine: Negative.   Genitourinary: Negative.   Musculoskeletal: Positive for back pain and neck pain.  Skin: Negative for color change and wound.  Neurological: Negative.   Hematological: Negative.   Psychiatric/Behavioral: Negative for behavioral problems.    Past Medical History:  Diagnosis Date  . Anemia   . Arthritis   . Crohn disease ([place])   . Fluid retention in legs   . Hypertension   . PONV (postoperative nausea and vomiting)   . Pre-diabetes   . Seasonal allergies    TAKES ZYRTEC AS NEEDED  . Sleep apnea    cpap at night  . Snores     Past Surgical History:  Procedure Laterality Date  . ABDOMINAL HYSTERECTOMY    . APPENDECTOMY    . CARPAL TUNNEL RELEASE  05/16/2011   Procedure: CARPAL TUNNEL RELEASE;  Surgeon: Cammie Sickle., MD;  Location: [facility];  Service: Orthopedics;  Laterality: Left;  . CARPAL TUNNEL RELEASE  08/08/2011   Procedure: CARPAL TUNNEL RELEASE;  Surgeon: Cammie Sickle., MD;  Location: [facility];  Service: Orthopedics;  Laterality: Right;  . CHOLECYSTECTOMY    . COLON SURGERY     patient had intestinal blockage  . COLONOSCOPY    . ESOPHAGOGASTRODUODENOSCOPY    . GASTRIC BYPASS    . KNEE ARTHROSCOPY  05/09/2010   right   . KNEE ARTHROSCOPY  05/10/2007   left  . LUMBAR LAMINECTOMY/DECOMPRESSION MICRODISCECTOMY N/A 10/31/2017   Procedure: LUMBAR LAMINECTOMY/DECOMPRESSION MICRODISCECTOMY ONE LEVEL  LUMBAR FOUR - LUMBAR FIVE DECOMPRESSION;  Surgeon: Melina Schools, MD;  Location: [facility];  Service: Orthopedics;  Laterality: N/A;  LUMBAR LAMINECTOMY/DECOMPRESSION MICRODISCECTOMY ONE LEVEL  LUMBAR FOUR - LUMBAR FIVE DECOMPRESSION  . TOTAL VAGINAL HYSTERECTOMY  07/20/2005   posterior colporrhaphy      Current Outpatient Medications:  .  atorvastatin (LIPITOR) 10 MG tablet, Take 10 mg by mouth every evening., Disp: , Rfl: 1 .  Multiple  Vitamins-Minerals (HAIR SKIN AND NAILS FORMULA PO), Take 1 tablet by mouth., Disp: , Rfl:  .  Multiple Vitamins-Minerals (MULTIVITAMIN WITH MINERALS) tablet, Take 1 tablet by mouth daily., Disp: , Rfl:  .  valsartan-hydrochlorothiazide (DIOVAN-HCT) 320-25 MG tablet, Take 1 tablet by mouth daily., Disp: , Rfl: 0 .  gabapentin (NEURONTIN) 300 MG capsule, Take 300 mg by mouth every evening., Disp: , Rfl: 1 .  hydrOXYzine (ATARAX/VISTARIL) 25 MG tablet, Take 0.5-1 tablets (12.5-25 mg total) by mouth every 6 (six) hours as needed for itching. (Patient not taking: Reported on 10/08/2018), Disp: 30 tablet, Rfl: 0 .  methocarbamol (ROBAXIN) 500 MG tablet, Take 1 tablet (500 mg total) by mouth 3 (three) times daily. (Patient not taking: Reported on 11/08/2017), Disp: 30 tablet, Rfl: 0 .  ondansetron (ZOFRAN ODT) 4 MG disintegrating tablet, Take 1 tablet (4 mg total) by mouth every 8 (eight) hours as needed for nausea or vomiting. (Patient not taking: Reported on 11/08/2017), Disp: 20 tablet, Rfl: 0   Objective:   Vitals:   10/08/18 1405  BP: (!) 144/91  Pulse: 79  Temp: (!) 97.5 F (36.4 C)  SpO2: 93%    Physical Exam Vitals signs and nursing note reviewed.  Constitutional:      Appearance: Normal appearance.  HENT:     Head: Normocephalic and atraumatic.     Nose: Nose normal.     Mouth/Throat:     Mouth: Mucous membranes are moist.  Eyes:     Extraocular Movements: Extraocular movements intact.  Cardiovascular:     Rate and Rhythm: Normal rate.     Pulses: Normal pulses.  Pulmonary:     Effort: Pulmonary effort is normal. No respiratory distress.     Breath sounds: No wheezing.  Abdominal:     General: Abdomen is flat. There is no distension.     Tenderness: There is no abdominal tenderness.  Musculoskeletal: Normal range of motion.        General: No swelling.  Skin:    General: Skin is warm.  Neurological:     General: No focal deficit present.     Mental Status: She is alert and  oriented to person, place, and time.  Psychiatric:        Mood and Affect: Mood normal.        Behavior: Behavior normal.        Thought Content: Thought content normal.        Judgment: Judgment normal.     Assessment & Plan:     ICD-10-CM   1. 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  Z98.890   2. Neck pain  M54.2   3. Chronic bilateral thoracic back pain  M54.6    G89.29   4. Symptomatic mammary hypertrophy  N62      Recommend bilateral breast reduction.  Will need inferior pedicle  technique. Needs mammogram prior to surgery. Pictures were obtained of the patient and placed in the chart with the patient's or guardian's permission.  [place], DO

## 2018-10-11 ENCOUNTER — Institutional Professional Consult (permissible substitution): Payer: BC Managed Care – PPO | Admitting: Plastic Surgery

## 2019-01-27 ENCOUNTER — Telehealth: Payer: Self-pay

## 2019-01-27 NOTE — Telephone Encounter (Signed)

## 2019-01-28 ENCOUNTER — Encounter (HOSPITAL_BASED_OUTPATIENT_CLINIC_OR_DEPARTMENT_OTHER): Payer: Self-pay | Admitting: *Deleted

## 2019-01-28 ENCOUNTER — Ambulatory Visit (INDEPENDENT_AMBULATORY_CARE_PROVIDER_SITE_OTHER): Payer: BC Managed Care – PPO | Admitting: Surgical

## 2019-01-28 ENCOUNTER — Encounter: Payer: Self-pay | Admitting: Surgical

## 2019-01-28 ENCOUNTER — Other Ambulatory Visit: Payer: Self-pay

## 2019-01-28 VITALS — BP 128/84 | HR 90 | Temp 97.3°F | Wt 281.0 lb

## 2019-01-28 DIAGNOSIS — Z9889 Other specified postprocedural states: Secondary | ICD-10-CM

## 2019-01-28 DIAGNOSIS — M546 Pain in thoracic spine: Secondary | ICD-10-CM

## 2019-01-28 DIAGNOSIS — N62 Hypertrophy of breast: Secondary | ICD-10-CM

## 2019-01-28 DIAGNOSIS — G8929 Other chronic pain: Secondary | ICD-10-CM

## 2019-01-28 DIAGNOSIS — M542 Cervicalgia: Secondary | ICD-10-CM

## 2019-01-28 MED ORDER — ONDANSETRON HCL 4 MG PO TABS: 4.0000 mg | ORAL_TABLET | Freq: Three times a day (TID) | ORAL | 0 refills | Status: DC | PRN

## 2019-01-28 MED ORDER — CEPHALEXIN 500 MG PO CAPS: 500.0000 mg | ORAL_CAPSULE | Freq: Four times a day (QID) | ORAL | 0 refills | Status: AC

## 2019-01-28 MED ORDER — HYDROCODONE-ACETAMINOPHEN 5-325 MG PO TABS: 1.0000 | ORAL_TABLET | Freq: Four times a day (QID) | ORAL | 0 refills | Status: AC | PRN

## 2019-01-28 NOTE — H&P (View-Only) (Signed)
Patient ID: Vanessa Dean, female    DOB: 06-23-1965, 53 y.o.   MRN: [id number]  Chief Complaint  Patient presents with  . Pre-op Exam      ICD-10-CM   1. Chronic bilateral thoracic back pain  M54.6    G89.29   2. Symptomatic mammary hypertrophy  N62   3. 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  Z98.890   4. Neck pain  M54.2      History of Present Illness: Vanessa Dean is a 53 y.o.  female  with a history of mammary hyperplasia which has caused her significant back pain.  She also has a history of lumbar back surgery for decompression of spinal cord.  She presents for preoperative evaluation for upcoming procedure, bilateral breast reduction, scheduled for 02/10/2019 with Dr. Marla Roe.  She reports PONV after previous surgeries.  She is not sure if she has had scopolamine before.  She has not had any recent colds or illnesses, no history of DVT/PE, no family history of DVT/PE, she is not taking any blood thinners. She does not smoke.  She has a past medical history of hyperlipidemia, prediabetes, hypertension, bariatric surgery, back pain, OSA.  She reports that her most recent labs at her PCP in July were normal, A1c was 7%. She has a hx of anemia, she reports this is well controlled. She reports no issues with OSA.    She reports seeing her PCP a few months ago who was aware of planned breast reduction and had no issues with her undergoing surgical intervention.  Her sternal to nipple distance on the right is 42 cm and 44 cm in the left.  Inframammary fold distance is measured.  She is 5 feet 5 inches tall and weighs 281 pounds.  Her preop bra size is a 40H cup.  She would like to be a D/DD, closer to a full D.  The estimated excess breast tissue to remove at the time of surgery is 1200 g on the left and 1200 g right.  She had a mammogram recently which resulted on December 26, 2018, patient received letter from the breast center reporting that  there were no findings suspicious for malignancy.   Past Medical History: Allergies: Allergies  Allergen Reactions  . Oxycodone-Acetaminophen Rash    Current Medications:  Current Outpatient Medications:  .  atorvastatin (LIPITOR) 10 MG tablet, Take 10 mg by mouth every evening., Disp: , Rfl: 1 .  CINNAMON PO, Take by mouth., Disp: , Rfl:  .  Multiple Vitamins-Minerals (MULTIVITAMIN WITH MINERALS) tablet, Take 1 tablet by mouth daily., Disp: , Rfl:  .  valsartan-hydrochlorothiazide (DIOVAN-HCT) 320-25 MG tablet, Take 1 tablet by mouth daily., Disp: , Rfl: 0  Past Medical Problems: Past Medical History:  Diagnosis Date  . Anemia   . Arthritis   . Crohn disease ([place])   . Fluid retention in legs   . Hypertension   . PONV (postoperative nausea and vomiting)   . Pre-diabetes   . Seasonal allergies    TAKES ZYRTEC AS NEEDED  . Sleep apnea    cpap at night  . Snores     Past Surgical History: Past Surgical History:  Procedure Laterality Date  . ABDOMINAL HYSTERECTOMY    . APPENDECTOMY    . CARPAL TUNNEL RELEASE  05/16/2011   Procedure: CARPAL TUNNEL RELEASE;  Surgeon: Cammie Sickle., MD;  Location: [facility];  Service: Orthopedics;  Laterality: Left;  .  CARPAL TUNNEL RELEASE  08/08/2011   Procedure: CARPAL TUNNEL RELEASE;  Surgeon: Cammie Sickle., MD;  Location: [place];  Service: Orthopedics;  Laterality: Right;  . CHOLECYSTECTOMY    . COLON SURGERY     patient had intestinal blockage  . COLONOSCOPY    . ESOPHAGOGASTRODUODENOSCOPY    . GASTRIC BYPASS    . KNEE ARTHROSCOPY  05/09/2010   right   . KNEE ARTHROSCOPY  05/10/2007   left  . LUMBAR LAMINECTOMY/DECOMPRESSION MICRODISCECTOMY N/A 10/31/2017   Procedure: LUMBAR LAMINECTOMY/DECOMPRESSION MICRODISCECTOMY ONE LEVEL  LUMBAR FOUR - LUMBAR FIVE DECOMPRESSION;  Surgeon: Melina Schools, MD;  Location: [facility];  Service: Orthopedics;  Laterality: N/A;  LUMBAR LAMINECTOMY/DECOMPRESSION  MICRODISCECTOMY ONE LEVEL  LUMBAR FOUR - LUMBAR FIVE DECOMPRESSION  . TOTAL VAGINAL HYSTERECTOMY  07/20/2005   posterior colporrhaphy    Social History: Social History   Socioeconomic History  . Marital status: Married    Spouse name: Not on file  . Number of children: Not on file  . Years of education: Not on file  . Highest education level: Not on file  Occupational History  . Not on file  Social Needs  . Financial resource strain: Not on file  . Food insecurity    Worry: Not on file    Inability: Not on file  . Transportation needs    Medical: Not on file    Non-medical: Not on file  Tobacco Use  . Smoking status: Never Smoker  . Smokeless tobacco: Never Used  Substance and Sexual Activity  . Alcohol use: Yes    Comment: SOCIAL  . Drug use: No  . Sexual activity: Yes  Lifestyle  . Physical activity    Days per week: Not on file    Minutes per session: Not on file  . Stress: Not on file  Relationships  . Social Herbalist on phone: Not on file    Gets together: Not on file    Attends religious service: Not on file    Active member of club or organization: Not on file    Attends meetings of clubs or organizations: Not on file    Relationship status: Not on file  . Intimate partner violence    Fear of current or ex partner: Not on file    Emotionally abused: Not on file    Physically abused: Not on file    Forced sexual activity: Not on file  Other Topics Concern  . Not on file  Social History Narrative  . Not on file    Family History: Family History  Problem Relation Age of Onset  . Alcohol abuse Paternal Uncle   . Diabetes Paternal Uncle     Review of Systems: Review of Systems  Constitutional: Negative for chills, diaphoresis, fever and malaise/fatigue.  Respiratory: Negative.   Cardiovascular: Negative for chest pain, palpitations and leg swelling.  Genitourinary: Negative.   Musculoskeletal: Positive for back pain, myalgias and neck  pain.  Skin: Negative for itching and rash.  Neurological: Negative.     Physical Exam: Vital Signs BP 128/84 (BP Location: Left Arm, Patient Position: Sitting, Cuff Size: Normal)   Pulse 90   Temp (!) 97.3 F (36.3 C) (Temporal)   Wt 281 lb (127.5 kg)   SpO2 98%   BMI 46.76 kg/m  Physical Exam Exam conducted with a chaperone present.  Constitutional:      General: She is not in acute distress.    Appearance:  Normal appearance. She is not ill-appearing.  HENT:     Head: Normocephalic and atraumatic.  Eyes:     Pupils: Pupils are equal, round Neck:     Musculoskeletal: Normal range of motion.  Cardiovascular:     Rate and Rhythm: Normal rate and regular rhythm.     Pulses: Normal pulses.     Heart sounds: Normal heart sounds. No murmur.  Pulmonary:     Effort: Pulmonary effort is normal. No respiratory distress.     Breath sounds: Normal breath sounds. No wheezing.  Abdominal:     General: Abdomen is flat. There is no distension.     Palpations: Abdomen is soft.     Tenderness: There is no abdominal tenderness.  Musculoskeletal: Normal range of motion. No LE swelling noted. Skin:    General: Skin is warm and dry.     Findings: No erythema or rash.  Neurological:     General: No focal deficit present.     Mental Status: She is alert and oriented to person, place, and time. Mental status is at baseline.     Motor: No weakness.  Psychiatric:        Mood and Affect: Mood normal.        Behavior: Behavior normal.    Assessment/Plan: The patient is scheduled for bilateral breast reduction with Dr. Marla Roe.  Risks, benefits, and alternatives of procedure discussed, questions answered and consent obtained.    Prescription sent to pharmacy.  Patient reports that she has tolerated Norco in the past.  The risk that can be encountered with breast reduction were discussed and include the following but not limited to these:  Breast asymmetry, fluid accumulation, firmness of  the breast, inability to breast feed, loss of nipple or areola, skin loss, decrease or no nipple sensation, fat necrosis of the breast tissue, bleeding, infection, healing delay.  There are risks of anesthesia, changes to skin sensation and injury to nerves or blood vessels.  The muscle can be temporarily or permanently injured.  You may have an allergic reaction to tape, suture, glue, blood products which can result in skin discoloration, swelling, pain, skin lesions, poor healing.  Any of these can lead to the need for revisonal surgery or stage procedures.  A reduction has potential to interfere with diagnostic procedures.  Nipple or breast piercing can increase risks of infection.  This procedure is best done when the breast is fully developed.  Changes in the breast will continue to occur over time.  Pregnancy can alter the outcomes of previous breast reduction surgery, weight gain and weigh loss can also effect the long term appearance.   Breast reduction complications and general complications thoroughly covered, patient verbalized understanding and agreement.  Electronically signed by: Carola Rhine [place] Rosamond, PA-C 01/28/2019 4:00 PM

## 2019-01-28 NOTE — Progress Notes (Signed)
Patient ID: Vanessa Dean, female    DOB: 08/24/1965, 53 y.o.   MRN: [id number]  Chief Complaint  Patient presents with  . Pre-op Exam      ICD-10-CM   1. Chronic bilateral thoracic back pain  M54.6    G89.29   2. Symptomatic mammary hypertrophy  N62   3. 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  Z98.890   4. Neck pain  M54.2      History of Present Illness: Vanessa Dean is a 53 y.o.  female  with a history of mammary hyperplasia which has caused her significant back pain.  She also has a history of lumbar back surgery for decompression of spinal cord.  She presents for preoperative evaluation for upcoming procedure, bilateral breast reduction, scheduled for 02/10/2019 with Dr. Marla Roe.  She reports PONV after previous surgeries.  She is not sure if she has had scopolamine before.  She has not had any recent colds or illnesses, no history of DVT/PE, no family history of DVT/PE, she is not taking any blood thinners. She does not smoke.  She has a past medical history of hyperlipidemia, prediabetes, hypertension, bariatric surgery, back pain, OSA.  She reports that her most recent labs at her PCP in July were normal, A1c was 7%. She has a hx of anemia, she reports this is well controlled. She reports no issues with OSA.    She reports seeing her PCP a few months ago who was aware of planned breast reduction and had no issues with her undergoing surgical intervention.  Her sternal to nipple distance on the right is 42 cm and 44 cm in the left.  Inframammary fold distance is measured.  She is 5 feet 5 inches tall and weighs 281 pounds.  Her preop bra size is a 40H cup.  She would like to be a D/DD, closer to a full D.  The estimated excess breast tissue to remove at the time of surgery is 1200 g on the left and 1200 g right.  She had a mammogram recently which resulted on December 26, 2018, patient received letter from the breast center reporting that  there were no findings suspicious for malignancy.   Past Medical History: Allergies: Allergies  Allergen Reactions  . Oxycodone-Acetaminophen Rash    Current Medications:  Current Outpatient Medications:  .  atorvastatin (LIPITOR) 10 MG tablet, Take 10 mg by mouth every evening., Disp: , Rfl: 1 .  CINNAMON PO, Take by mouth., Disp: , Rfl:  .  Multiple Vitamins-Minerals (MULTIVITAMIN WITH MINERALS) tablet, Take 1 tablet by mouth daily., Disp: , Rfl:  .  valsartan-hydrochlorothiazide (DIOVAN-HCT) 320-25 MG tablet, Take 1 tablet by mouth daily., Disp: , Rfl: 0  Past Medical Problems: Past Medical History:  Diagnosis Date  . Anemia   . Arthritis   . Crohn disease ([place])   . Fluid retention in legs   . Hypertension   . PONV (postoperative nausea and vomiting)   . Pre-diabetes   . Seasonal allergies    TAKES ZYRTEC AS NEEDED  . Sleep apnea    cpap at night  . Snores     Past Surgical History: Past Surgical History:  Procedure Laterality Date  . ABDOMINAL HYSTERECTOMY    . APPENDECTOMY    . CARPAL TUNNEL RELEASE  05/16/2011   Procedure: CARPAL TUNNEL RELEASE;  Surgeon: Cammie Sickle., MD;  Location: [place] [facility];  Service: Orthopedics;  Laterality: Left;  .  CARPAL TUNNEL RELEASE  08/08/2011   Procedure: CARPAL TUNNEL RELEASE;  Surgeon: Cammie Sickle., MD;  Location: [place];  Service: Orthopedics;  Laterality: Right;  . CHOLECYSTECTOMY    . COLON SURGERY     patient had intestinal blockage  . COLONOSCOPY    . ESOPHAGOGASTRODUODENOSCOPY    . GASTRIC BYPASS    . KNEE ARTHROSCOPY  05/09/2010   right   . KNEE ARTHROSCOPY  05/10/2007   left  . LUMBAR LAMINECTOMY/DECOMPRESSION MICRODISCECTOMY N/A 10/31/2017   Procedure: LUMBAR LAMINECTOMY/DECOMPRESSION MICRODISCECTOMY ONE LEVEL  LUMBAR FOUR - LUMBAR FIVE DECOMPRESSION;  Surgeon: Melina Schools, MD;  Location: [facility];  Service: Orthopedics;  Laterality: N/A;  LUMBAR LAMINECTOMY/DECOMPRESSION  MICRODISCECTOMY ONE LEVEL  LUMBAR FOUR - LUMBAR FIVE DECOMPRESSION  . TOTAL VAGINAL HYSTERECTOMY  07/20/2005   posterior colporrhaphy    Social History: Social History   Socioeconomic History  . Marital status: Married    Spouse name: Not on file  . Number of children: Not on file  . Years of education: Not on file  . Highest education level: Not on file  Occupational History  . Not on file  Social Needs  . Financial resource strain: Not on file  . Food insecurity    Worry: Not on file    Inability: Not on file  . Transportation needs    Medical: Not on file    Non-medical: Not on file  Tobacco Use  . Smoking status: Never Smoker  . Smokeless tobacco: Never Used  Substance and Sexual Activity  . Alcohol use: Yes    Comment: SOCIAL  . Drug use: No  . Sexual activity: Yes  Lifestyle  . Physical activity    Days per week: Not on file    Minutes per session: Not on file  . Stress: Not on file  Relationships  . Social Herbalist on phone: Not on file    Gets together: Not on file    Attends religious service: Not on file    Active member of club or organization: Not on file    Attends meetings of clubs or organizations: Not on file    Relationship status: Not on file  . Intimate partner violence    Fear of current or ex partner: Not on file    Emotionally abused: Not on file    Physically abused: Not on file    Forced sexual activity: Not on file  Other Topics Concern  . Not on file  Social History Narrative  . Not on file    Family History: Family History  Problem Relation Age of Onset  . Alcohol abuse Paternal Uncle   . Diabetes Paternal Uncle     Review of Systems: Review of Systems  Constitutional: Negative for chills, diaphoresis, fever and malaise/fatigue.  Respiratory: Negative.   Cardiovascular: Negative for chest pain, palpitations and leg swelling.  Genitourinary: Negative.   Musculoskeletal: Positive for back pain, myalgias and neck  pain.  Skin: Negative for itching and rash.  Neurological: Negative.     Physical Exam: Vital Signs BP 128/84 (BP Location: Left Arm, Patient Position: Sitting, Cuff Size: Normal)   Pulse 90   Temp (!) 97.3 F (36.3 C) (Temporal)   Wt 281 lb (127.5 kg)   SpO2 98%   BMI 46.76 kg/m  Physical Exam Exam conducted with a chaperone present.  Constitutional:      General: She is not in acute distress.    Appearance:  Normal appearance. She is not ill-appearing.  HENT:     Head: Normocephalic and atraumatic.  Eyes:     Pupils: Pupils are equal, round Neck:     Musculoskeletal: Normal range of motion.  Cardiovascular:     Rate and Rhythm: Normal rate and regular rhythm.     Pulses: Normal pulses.     Heart sounds: Normal heart sounds. No murmur.  Pulmonary:     Effort: Pulmonary effort is normal. No respiratory distress.     Breath sounds: Normal breath sounds. No wheezing.  Abdominal:     General: Abdomen is flat. There is no distension.     Palpations: Abdomen is soft.     Tenderness: There is no abdominal tenderness.  Musculoskeletal: Normal range of motion. No LE swelling noted. Skin:    General: Skin is warm and dry.     Findings: No erythema or rash.  Neurological:     General: No focal deficit present.     Mental Status: She is alert and oriented to person, place, and time. Mental status is at baseline.     Motor: No weakness.  Psychiatric:        Mood and Affect: Mood normal.        Behavior: Behavior normal.    Assessment/Plan: The patient is scheduled for bilateral breast reduction with Dr. Marla Roe.  Risks, benefits, and alternatives of procedure discussed, questions answered and consent obtained.    Prescription sent to pharmacy.  Patient reports that she has tolerated Norco in the past.  The risk that can be encountered with breast reduction were discussed and include the following but not limited to these:  Breast asymmetry, fluid accumulation, firmness of  the breast, inability to breast feed, loss of nipple or areola, skin loss, decrease or no nipple sensation, fat necrosis of the breast tissue, bleeding, infection, healing delay.  There are risks of anesthesia, changes to skin sensation and injury to nerves or blood vessels.  The muscle can be temporarily or permanently injured.  You may have an allergic reaction to tape, suture, glue, blood products which can result in skin discoloration, swelling, pain, skin lesions, poor healing.  Any of these can lead to the need for revisonal surgery or stage procedures.  A reduction has potential to interfere with diagnostic procedures.  Nipple or breast piercing can increase risks of infection.  This procedure is best done when the breast is fully developed.  Changes in the breast will continue to occur over time.  Pregnancy can alter the outcomes of previous breast reduction surgery, weight gain and weigh loss can also effect the long term appearance.   Breast reduction complications and general complications thoroughly covered, patient verbalized understanding and agreement.  Electronically signed by: Carola Rhine Tabitha Tupper, PA-C 01/28/2019 4:00 PM

## 2019-01-28 NOTE — H&P (View-Only) (Signed)
Patient ID: Vanessa Dean, female    DOB: 1965-07-19, 53 y.o.   MRN: [id number]  Chief Complaint  Patient presents with  . Pre-op Exam      ICD-10-CM   1. Chronic bilateral thoracic back pain  M54.6    G89.29   2. Symptomatic mammary hypertrophy  N62   3. 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  Z98.890   4. Neck pain  M54.2      History of Present Illness: Vanessa Dean is a 53 y.o.  female  with a history of mammary hyperplasia which has caused her significant back pain.  She also has a history of lumbar back surgery for decompression of spinal cord.  She presents for preoperative evaluation for upcoming procedure, bilateral breast reduction, scheduled for 02/10/2019 with Dr. Marla Roe.  She reports PONV after previous surgeries.  She is not sure if she has had scopolamine before.  She has not had any recent colds or illnesses, no history of DVT/PE, no family history of DVT/PE, she is not taking any blood thinners. She does not smoke.  She has a past medical history of hyperlipidemia, prediabetes, hypertension, bariatric surgery, back pain, OSA.  She reports that her most recent labs at her PCP in July were normal, A1c was 7%. She has a hx of anemia, she reports this is well controlled. She reports no issues with OSA.    She reports seeing her PCP a few months ago who was aware of planned breast reduction and had no issues with her undergoing surgical intervention.  Her sternal to nipple distance on the right is 42 cm and 44 cm in the left.  Inframammary fold distance is measured.  She is 5 feet 5 inches tall and weighs 281 pounds.  Her preop bra size is a 40H cup.  She would like to be a D/DD, closer to a full D.  The estimated excess breast tissue to remove at the time of surgery is 1200 g on the left and 1200 g right.  She had a mammogram recently which resulted on December 26, 2018, patient received letter from the breast center reporting that  there were no findings suspicious for malignancy.   Past Medical History: Allergies: Allergies  Allergen Reactions  . Oxycodone-Acetaminophen Rash    Current Medications:  Current Outpatient Medications:  .  atorvastatin (LIPITOR) 10 MG tablet, Take 10 mg by mouth every evening., Disp: , Rfl: 1 .  CINNAMON PO, Take by mouth., Disp: , Rfl:  .  Multiple Vitamins-Minerals (MULTIVITAMIN WITH MINERALS) tablet, Take 1 tablet by mouth daily., Disp: , Rfl:  .  valsartan-hydrochlorothiazide (DIOVAN-HCT) 320-25 MG tablet, Take 1 tablet by mouth daily., Disp: , Rfl: 0  Past Medical Problems: Past Medical History:  Diagnosis Date  . Anemia   . Arthritis   . Crohn disease ([place])   . Fluid retention in legs   . Hypertension   . PONV (postoperative nausea and vomiting)   . Pre-diabetes   . Seasonal allergies    TAKES ZYRTEC AS NEEDED  . Sleep apnea    cpap at night  . Snores     Past Surgical History: Past Surgical History:  Procedure Laterality Date  . ABDOMINAL HYSTERECTOMY    . APPENDECTOMY    . CARPAL TUNNEL RELEASE  05/16/2011   Procedure: CARPAL TUNNEL RELEASE;  Surgeon: Cammie Sickle., MD;  Location: [facility];  Service: Orthopedics;  Laterality: Left;  .  CARPAL TUNNEL RELEASE  08/08/2011   Procedure: CARPAL TUNNEL RELEASE;  Surgeon: Cammie Sickle., MD;  Location: [facility];  Service: Orthopedics;  Laterality: Right;  . CHOLECYSTECTOMY    . COLON SURGERY     patient had intestinal blockage  . COLONOSCOPY    . ESOPHAGOGASTRODUODENOSCOPY    . GASTRIC BYPASS    . KNEE ARTHROSCOPY  05/09/2010   right   . KNEE ARTHROSCOPY  05/10/2007   left  . LUMBAR LAMINECTOMY/DECOMPRESSION MICRODISCECTOMY N/A 10/31/2017   Procedure: LUMBAR LAMINECTOMY/DECOMPRESSION MICRODISCECTOMY ONE LEVEL  LUMBAR FOUR - LUMBAR FIVE DECOMPRESSION;  Surgeon: Melina Schools, MD;  Location: [facility];  Service: Orthopedics;  Laterality: N/A;  LUMBAR LAMINECTOMY/DECOMPRESSION  MICRODISCECTOMY ONE LEVEL  LUMBAR FOUR - LUMBAR FIVE DECOMPRESSION  . TOTAL VAGINAL HYSTERECTOMY  07/20/2005   posterior colporrhaphy    Social History: Social History   Socioeconomic History  . Marital status: Married    Spouse name: Not on file  . Number of children: Not on file  . Years of education: Not on file  . Highest education level: Not on file  Occupational History  . Not on file  Social Needs  . Financial resource strain: Not on file  . Food insecurity    Worry: Not on file    Inability: Not on file  . Transportation needs    Medical: Not on file    Non-medical: Not on file  Tobacco Use  . Smoking status: Never Smoker  . Smokeless tobacco: Never Used  Substance and Sexual Activity  . Alcohol use: Yes    Comment: SOCIAL  . Drug use: No  . Sexual activity: Yes  Lifestyle  . Physical activity    Days per week: Not on file    Minutes per session: Not on file  . Stress: Not on file  Relationships  . Social Herbalist on phone: Not on file    Gets together: Not on file    Attends religious service: Not on file    Active member of club or organization: Not on file    Attends meetings of clubs or organizations: Not on file    Relationship status: Not on file  . Intimate partner violence    Fear of current or ex partner: Not on file    Emotionally abused: Not on file    Physically abused: Not on file    Forced sexual activity: Not on file  Other Topics Concern  . Not on file  Social History Narrative  . Not on file    Family History: Family History  Problem Relation Age of Onset  . Alcohol abuse Paternal Uncle   . Diabetes Paternal Uncle     Review of Systems: Review of Systems  Constitutional: Negative for chills, diaphoresis, fever and malaise/fatigue.  Respiratory: Negative.   Cardiovascular: Negative for chest pain, palpitations and leg swelling.  Genitourinary: Negative.   Musculoskeletal: Positive for back pain, myalgias and neck  pain.  Skin: Negative for itching and rash.  Neurological: Negative.     Physical Exam: Vital Signs BP 128/84 (BP Location: Left Arm, Patient Position: Sitting, Cuff Size: Normal)   Pulse 90   Temp (!) 97.3 F (36.3 C) (Temporal)   Wt 281 lb (127.5 kg)   SpO2 98%   BMI 46.76 kg/m  Physical Exam Exam conducted with a chaperone present.  Constitutional:      General: She is not in acute distress.    Appearance:  Normal appearance. She is not ill-appearing.  HENT:     Head: Normocephalic and atraumatic.  Eyes:     Pupils: Pupils are equal, round Neck:     Musculoskeletal: Normal range of motion.  Cardiovascular:     Rate and Rhythm: Normal rate and regular rhythm.     Pulses: Normal pulses.     Heart sounds: Normal heart sounds. No murmur.  Pulmonary:     Effort: Pulmonary effort is normal. No respiratory distress.     Breath sounds: Normal breath sounds. No wheezing.  Abdominal:     General: Abdomen is flat. There is no distension.     Palpations: Abdomen is soft.     Tenderness: There is no abdominal tenderness.  Musculoskeletal: Normal range of motion. No LE swelling noted. Skin:    General: Skin is warm and dry.     Findings: No erythema or rash.  Neurological:     General: No focal deficit present.     Mental Status: She is alert and oriented to person, place, and time. Mental status is at baseline.     Motor: No weakness.  Psychiatric:        Mood and Affect: Mood normal.        Behavior: Behavior normal.    Assessment/Plan: The patient is scheduled for bilateral breast reduction with Dr. Marla Roe.  Risks, benefits, and alternatives of procedure discussed, questions answered and consent obtained.    Prescription sent to pharmacy.  Patient reports that she has tolerated Norco in the past.  The risk that can be encountered with breast reduction were discussed and include the following but not limited to these:  Breast asymmetry, fluid accumulation, firmness of  the breast, inability to breast feed, loss of nipple or areola, skin loss, decrease or no nipple sensation, fat necrosis of the breast tissue, bleeding, infection, healing delay.  There are risks of anesthesia, changes to skin sensation and injury to nerves or blood vessels.  The muscle can be temporarily or permanently injured.  You may have an allergic reaction to tape, suture, glue, blood products which can result in skin discoloration, swelling, pain, skin lesions, poor healing.  Any of these can lead to the need for revisonal surgery or stage procedures.  A reduction has potential to interfere with diagnostic procedures.  Nipple or breast piercing can increase risks of infection.  This procedure is best done when the breast is fully developed.  Changes in the breast will continue to occur over time.  Pregnancy can alter the outcomes of previous breast reduction surgery, weight gain and weigh loss can also effect the long term appearance.   Breast reduction complications and general complications thoroughly covered, patient verbalized understanding and agreement.  Electronically signed by: Carola Rhine Jazman Reuter, PA-C 01/28/2019 4:00 PM

## 2019-02-06 ENCOUNTER — Encounter (HOSPITAL_BASED_OUTPATIENT_CLINIC_OR_DEPARTMENT_OTHER)
Admission: RE | Admit: 2019-02-06 | Discharge: 2019-02-06 | Disposition: A | Discharge status: Home / Self Care | Payer: BC Managed Care – PPO | Source: Ambulatory Visit | Attending: Plastic Surgery | Admitting: Plastic Surgery

## 2019-02-06 ENCOUNTER — Other Ambulatory Visit (HOSPITAL_COMMUNITY)
Admission: RE | Admit: 2019-02-06 | Discharge: 2019-02-06 | Disposition: A | Discharge status: Home / Self Care | Payer: BC Managed Care – PPO | Source: Ambulatory Visit | Attending: Plastic Surgery | Admitting: Plastic Surgery

## 2019-02-06 ENCOUNTER — Other Ambulatory Visit: Payer: Self-pay

## 2019-02-06 DIAGNOSIS — Z5309 Procedure and treatment not carried out because of other contraindication: Secondary | ICD-10-CM | POA: Diagnosis not present

## 2019-02-06 DIAGNOSIS — Z6841 Body Mass Index (BMI) 40.0 and over, adult: Secondary | ICD-10-CM | POA: Diagnosis not present

## 2019-02-06 DIAGNOSIS — E119 Type 2 diabetes mellitus without complications: Secondary | ICD-10-CM | POA: Diagnosis not present

## 2019-02-06 DIAGNOSIS — Z01812 Encounter for preprocedural laboratory examination: Secondary | ICD-10-CM | POA: Insufficient documentation

## 2019-02-06 DIAGNOSIS — Z9884 Bariatric surgery status: Secondary | ICD-10-CM | POA: Diagnosis not present

## 2019-02-06 DIAGNOSIS — Z20828 Contact with and (suspected) exposure to other viral communicable diseases: Secondary | ICD-10-CM | POA: Insufficient documentation

## 2019-02-06 DIAGNOSIS — M542 Cervicalgia: Secondary | ICD-10-CM | POA: Diagnosis not present

## 2019-02-06 DIAGNOSIS — M546 Pain in thoracic spine: Secondary | ICD-10-CM | POA: Diagnosis not present

## 2019-02-06 DIAGNOSIS — G4733 Obstructive sleep apnea (adult) (pediatric): Secondary | ICD-10-CM | POA: Diagnosis not present

## 2019-02-06 DIAGNOSIS — I10 Essential (primary) hypertension: Secondary | ICD-10-CM | POA: Diagnosis not present

## 2019-02-06 DIAGNOSIS — N62 Hypertrophy of breast: Secondary | ICD-10-CM | POA: Diagnosis present

## 2019-02-06 LAB — BASIC METABOLIC PANEL
Anion gap: 10 (ref 5–15)
BUN: 9 mg/dL (ref 6–20)
CO2: 26 mmol/L (ref 22–32)
Calcium: 9.5 mg/dL (ref 8.9–10.3)
Chloride: 103 mmol/L (ref 98–111)
Creatinine, Ser: 0.8 mg/dL (ref 0.44–1.00)
GFR calc Af Amer: >60 mL/min (ref >60)
GFR calc non Af Amer: >60 mL/min (ref >60)
Glucose, Bld: 89 mg/dL (ref 70–99)
Potassium: 3.9 mmol/L (ref 3.5–5.1)
Sodium: 139 mmol/L (ref 135–145)

## 2019-02-06 LAB — POCT PREGNANCY, URINE: Preg Test, Ur: NEGATIVE

## 2019-02-06 NOTE — Anesthesia Preprocedure Evaluation (Addendum)
Anesthesia Evaluation  Patient identified by MRN, date of birth, ID band Patient awake    Reviewed: Allergy & Precautions, NPO status , Patient's Chart, lab work & pertinent test results  History of Anesthesia Complications (+) PONV and history of anesthetic complications (denies PONV)  Airway Mallampati: I  TM Distance: >3 FB Neck ROM: Full    Dental  (+) Dental Advisory Given, Teeth Intact,    Pulmonary sleep apnea and Continuous Positive Airway Pressure Ventilation ,    breath sounds clear to auscultation       Cardiovascular hypertension, Pt. on medications  Rhythm:Regular Rate:Normal     Neuro/Psych negative neurological ROS  negative psych ROS   GI/Hepatic (+)     (-) substance abuse  , Crohn's disease    Endo/Other  diabetes (diet controlled), Well ControlledMorbid obesityBMI 77  Renal/GU negative Renal ROS  negative genitourinary   Musculoskeletal  (+) Arthritis , Osteoarthritis,    Abdominal (+) + obese,   Peds  Hematology  (+) anemia ,   Anesthesia Other Findings   Reproductive/Obstetrics negative OB ROS                            Anesthesia Physical  Anesthesia Plan  ASA: III  Anesthesia Plan: General   Post-op Pain Management:    Induction: Intravenous  PONV Risk Score and Plan: 4 or greater and Ondansetron, Dexamethasone, Midazolam, Scopolamine patch - Pre-op, Treatment may vary due to age or medical condition and Propofol infusion  Airway Management Planned: Oral ETT  Additional Equipment: None  Intra-op Plan:   Post-operative Plan: Extubation in OR  Informed Consent: I have reviewed the patients History and Physical, chart, labs and discussed the procedure including the risks, benefits and alternatives for the proposed anesthesia with the patient or authorized representative who has indicated his/her understanding and acceptance.     Dental advisory  given  Plan Discussed with: CRNA  Anesthesia Plan Comments: (Consult for BMI, very reassuring airway on exam)      Anesthesia Quick Evaluation

## 2019-02-07 ENCOUNTER — Encounter: Payer: Self-pay | Admitting: Plastic Surgery

## 2019-02-09 LAB — NOVEL CORONAVIRUS, NAA (HOSP ORDER, SEND-OUT TO REF LAB; TAT 18-24 HRS): SARS-CoV-2, NAA: NOT DETECTED

## 2019-02-10 ENCOUNTER — Other Ambulatory Visit (HOSPITAL_COMMUNITY)
Admit: 2019-02-10 | Discharge: 2019-02-10 | Disposition: A | Discharge status: Home / Self Care | Payer: BC Managed Care – PPO | Attending: Plastic Surgery | Admitting: Plastic Surgery

## 2019-02-10 ENCOUNTER — Encounter (HOSPITAL_BASED_OUTPATIENT_CLINIC_OR_DEPARTMENT_OTHER)
Admission: RE | Disposition: A | Discharge status: Home / Self Care | Payer: Self-pay | Source: Home / Self Care | Attending: Plastic Surgery

## 2019-02-10 ENCOUNTER — Encounter (HOSPITAL_BASED_OUTPATIENT_CLINIC_OR_DEPARTMENT_OTHER): Payer: Self-pay | Admitting: *Deleted

## 2019-02-10 ENCOUNTER — Telehealth: Payer: Self-pay

## 2019-02-10 ENCOUNTER — Other Ambulatory Visit: Payer: Self-pay

## 2019-02-10 ENCOUNTER — Encounter (HOSPITAL_BASED_OUTPATIENT_CLINIC_OR_DEPARTMENT_OTHER): Payer: Self-pay

## 2019-02-10 ENCOUNTER — Other Ambulatory Visit (HOSPITAL_COMMUNITY): Payer: Self-pay

## 2019-02-10 ENCOUNTER — Ambulatory Visit (HOSPITAL_BASED_OUTPATIENT_CLINIC_OR_DEPARTMENT_OTHER)
Admission: RE | Admit: 2019-02-10 | Discharge: 2019-02-10 | Disposition: A | Discharge status: Home / Self Care | Payer: BC Managed Care – PPO | Attending: Plastic Surgery | Admitting: Plastic Surgery

## 2019-02-10 DIAGNOSIS — Z5309 Procedure and treatment not carried out because of other contraindication: Secondary | ICD-10-CM | POA: Insufficient documentation

## 2019-02-10 DIAGNOSIS — I10 Essential (primary) hypertension: Secondary | ICD-10-CM | POA: Insufficient documentation

## 2019-02-10 DIAGNOSIS — E119 Type 2 diabetes mellitus without complications: Secondary | ICD-10-CM | POA: Insufficient documentation

## 2019-02-10 DIAGNOSIS — N62 Hypertrophy of breast: Secondary | ICD-10-CM | POA: Insufficient documentation

## 2019-02-10 DIAGNOSIS — M546 Pain in thoracic spine: Secondary | ICD-10-CM | POA: Insufficient documentation

## 2019-02-10 DIAGNOSIS — Z20828 Contact with and (suspected) exposure to other viral communicable diseases: Secondary | ICD-10-CM | POA: Insufficient documentation

## 2019-02-10 DIAGNOSIS — G4733 Obstructive sleep apnea (adult) (pediatric): Secondary | ICD-10-CM | POA: Insufficient documentation

## 2019-02-10 DIAGNOSIS — M542 Cervicalgia: Secondary | ICD-10-CM | POA: Insufficient documentation

## 2019-02-10 DIAGNOSIS — Z9884 Bariatric surgery status: Secondary | ICD-10-CM | POA: Insufficient documentation

## 2019-02-10 DIAGNOSIS — Z6841 Body Mass Index (BMI) 40.0 and over, adult: Secondary | ICD-10-CM | POA: Insufficient documentation

## 2019-02-10 LAB — SARS CORONAVIRUS 2 (TAT 6-24 HRS): SARS Coronavirus 2: NEGATIVE

## 2019-02-10 SURGERY — MAMMOPLASTY, REDUCTION
Anesthesia: General | Site: Breast | Laterality: Bilateral

## 2019-02-10 MED ORDER — LACTATED RINGERS IV SOLN: INTRAVENOUS | Status: DC

## 2019-02-10 MED ORDER — LIDOCAINE-EPINEPHRINE 1 %-1:100000 IJ SOLN
INTRAMUSCULAR | Status: AC
  Filled 2019-02-10: qty 1

## 2019-02-10 MED ORDER — MIDAZOLAM HCL 2 MG/2ML IJ SOLN
INTRAMUSCULAR | Status: AC
  Filled 2019-02-10: qty 2

## 2019-02-10 MED ORDER — SODIUM CHLORIDE 0.9 % IV SOLN
INTRAVENOUS | Status: AC
  Filled 2019-02-10: qty 500000

## 2019-02-10 MED ORDER — FENTANYL CITRATE (PF) 100 MCG/2ML IJ SOLN: 50.0000 ug | INTRAMUSCULAR | Status: DC | PRN

## 2019-02-10 MED ORDER — SODIUM CHLORIDE (PF) 0.9 % IJ SOLN
INTRAMUSCULAR | Status: AC
  Filled 2019-02-10: qty 20

## 2019-02-10 MED ORDER — EPINEPHRINE PF 1 MG/ML IJ SOLN
INTRAMUSCULAR | Status: AC
  Filled 2019-02-10: qty 1

## 2019-02-10 MED ORDER — FENTANYL CITRATE (PF) 100 MCG/2ML IJ SOLN
INTRAMUSCULAR | Status: AC
  Filled 2019-02-10: qty 2

## 2019-02-10 MED ORDER — MIDAZOLAM HCL 2 MG/2ML IJ SOLN: 1.0000 mg | INTRAMUSCULAR | Status: DC | PRN

## 2019-02-10 NOTE — Interval H&P Note (Signed)
History and Physical Interval Note:  02/10/2019 10:44 AM  Vanessa Dean  has presented today for surgery, with the diagnosis of mammary hypertrophy.  The various methods of treatment have been discussed with the patient and family. After consideration of risks, benefits and other options for treatment, the patient has consented to  Procedure(s) with comments: MAMMARY REDUCTION  (BREAST) (Bilateral) - 3.5 hours, please as a surgical intervention.  The patient's history has been reviewed, patient examined, no change in status, stable for surgery.  I have reviewed the patient's chart and labs.  Questions were answered to the patient's satisfaction.     Loel Lofty [name]

## 2019-02-10 NOTE — Telephone Encounter (Signed)
Pt in surgery today

## 2019-02-10 NOTE — Op Note (Signed)
Case rescheduled due to need for isolation after covid test.

## 2019-02-10 NOTE — Discharge Instructions (Signed)
INSTRUCTIONS FOR AFTER BREAST SURGERY   You are getting ready to undergo breast surgery.  You will likely have some questions about what to expect following your operation.  The following information will help you and your family understand what to expect when you are discharged from the hospital.  Following these guidelines will help ensure a smooth recovery and reduce risks of complications.   Postoperative instructions include information on: diet, wound care, medications and physical activity.  AFTER SURGERY Expect to go home after the procedure.  In some cases, you may need to spend one night in the hospital for observation.  DIET Breast surgery does not require a specific diet.  However, the healthier you eat the better your body can start healing. It is important to increasing your protein intake.  This means limiting the foods with sugar and carbohydrates.  Focus on vegetables and some meat.  If you have any liposuction during your procedure be sure to drink water.  If your urine is bright yellow, then it is concentrated, and you need to drink more water.  As a general rule after surgery, you should have 8 ounces of water every hour while awake.  If you find you are persistently nauseated or unable to take in liquids let us know.  NO TOBACCO USE or EXPOSURE.  This will slow your healing process and increase the risk of a wound.  WOUND CARE If you don't have a drain:  You can shower the day after surgery. Use fragrance free soap.  Dial, [place] and [place] are usually mild on the skin. If you have a drain: You can shower five days after surgery.  Clean with baby wipes until the drain is removed.    If you have steri-strips / tape directly attached to your skin leave them in place. It is OK to get these wet.  No baths, pools or hot tubs for two weeks. We close your incision to leave the smallest and best-looking scar. No ointment or creams on your incisions until given the go ahead.  Especially not  Neosporin (Too many skin reactions with this one).  A few weeks after surgery you can use Mederma and start massaging the scar. We ask you to wear your binder or sports bra for the first 6 weeks around the clock, including while sleeping. This provides added comfort and helps reduce the fluid accumulation at the surgery site.  ACTIVITY No heavy lifting until cleared by the doctor.  This usually means no more than a half-gallon of milk.  It is OK to walk and climb stairs. In fact, moving your legs is very important to decrease your risk of a blood clot.  It will also help keep you from getting deconditioned.  Every 1 to 2 hours get up and walk for 5 minutes. This will help with a quicker recovery back to normal.  Let pain be your guide so you don't do too much.  This is not the time for spring cleaning and don't plan on taking care of anyone else.  This time is for you to recover,  You will be more comfortable if you sleep and rest with your head elevated either with a few pillows under you or in a recliner.  No stomach sleeping for a three months.  WORK Everyone returns to work at different times. As a rough guide, most people take at least 1 - 2 weeks off prior to returning to work. If you need documentation for your job,  bring the forms to your postoperative follow up visit.  DRIVING Arrange for someone to bring you home from the hospital.  You may be able to drive a few days after surgery but not while taking any narcotics or valium.  BOWEL MOVEMENTS Constipation can occur after anesthesia and while taking pain medication.  It is important to stay ahead for your comfort.  We recommend taking Milk of Magnesia (2 tablespoons; twice a day) while taking the pain pills.  SEROMA This is fluid your body tried to put in the surgical site.  This is normal but if it creates tight skinny skin let us know.  It usually decreases in a few weeks.  MEDICATIONS and PAIN CONTROL At your preoperative visit for  you history and physical you were given the following medications: 1. An antibiotic: Start this medication when you get home and take according to the instructions on the bottle. 2. Zofran 4 mg:  This is to treat nausea and vomiting.  You can take this every 6 hours as needed and only if needed. 3. Valium 2 mg: This is for muscle tightness if you have an implant or expander. This will help relax your muscle which also helps with pain control.  This can be taken every 12 hours as needed.  Don't drive after taking this medication. 4. Norco (hydrocodone/acetaminophen) 5/325 mg:  This is only to be used after you have taken the motrin or the tylenol. Every 8 hours as needed. Over the counter Medication to take: 5. Ibuprofen (Motrin) 600 mg:  Take this every 6 hours.  If you have additional pain then take 500 mg of the tylenol.  Only take the Norco after you have tried these two. 6. Miralax or stool softener of choice: Take this according to the bottle if you take the [place] Call your surgeon's office if any of the following occur:  Fever 101 degrees F or greater  Excessive bleeding or fluid from the incision site.  Pain that increases over time without aid from the medications  Redness, warmth, or pus draining from incision sites  Persistent nausea or inability to take in liquids  Severe misshapen area that underwent the operation.

## 2019-02-10 NOTE — Progress Notes (Signed)
Patient was not able to quarantine after her COVID test. Dr. Christella Hartigan with anesthesiology aware and following protocol surgery will be canceled.

## 2019-02-10 NOTE — Telephone Encounter (Signed)
Pt is in surgery today

## 2019-02-12 ENCOUNTER — Ambulatory Visit (HOSPITAL_BASED_OUTPATIENT_CLINIC_OR_DEPARTMENT_OTHER): Payer: BC Managed Care – PPO | Admitting: Anesthesiology

## 2019-02-12 ENCOUNTER — Encounter (HOSPITAL_BASED_OUTPATIENT_CLINIC_OR_DEPARTMENT_OTHER)
Admission: RE | Disposition: A | Discharge status: Home / Self Care | Payer: Self-pay | Source: Home / Self Care | Attending: Plastic Surgery

## 2019-02-12 ENCOUNTER — Other Ambulatory Visit: Payer: Self-pay

## 2019-02-12 ENCOUNTER — Encounter (HOSPITAL_BASED_OUTPATIENT_CLINIC_OR_DEPARTMENT_OTHER): Payer: Self-pay

## 2019-02-12 ENCOUNTER — Ambulatory Visit (HOSPITAL_BASED_OUTPATIENT_CLINIC_OR_DEPARTMENT_OTHER)
Admission: RE | Admit: 2019-02-12 | Discharge: 2019-02-13 | Disposition: A | Discharge status: Home / Self Care | Payer: BC Managed Care – PPO | Attending: Plastic Surgery | Admitting: Plastic Surgery

## 2019-02-12 DIAGNOSIS — M546 Pain in thoracic spine: Secondary | ICD-10-CM | POA: Insufficient documentation

## 2019-02-12 DIAGNOSIS — Z9889 Other specified postprocedural states: Secondary | ICD-10-CM | POA: Diagnosis not present

## 2019-02-12 DIAGNOSIS — M542 Cervicalgia: Secondary | ICD-10-CM | POA: Diagnosis not present

## 2019-02-12 DIAGNOSIS — N62 Hypertrophy of breast: Secondary | ICD-10-CM | POA: Diagnosis present

## 2019-02-12 DIAGNOSIS — I10 Essential (primary) hypertension: Secondary | ICD-10-CM | POA: Diagnosis not present

## 2019-02-12 DIAGNOSIS — G4733 Obstructive sleep apnea (adult) (pediatric): Secondary | ICD-10-CM | POA: Insufficient documentation

## 2019-02-12 DIAGNOSIS — Z9884 Bariatric surgery status: Secondary | ICD-10-CM | POA: Insufficient documentation

## 2019-02-12 DIAGNOSIS — G8929 Other chronic pain: Secondary | ICD-10-CM | POA: Diagnosis not present

## 2019-02-12 HISTORY — PX: BREAST REDUCTION SURGERY: SHX8

## 2019-02-12 SURGERY — MAMMOPLASTY, REDUCTION
Anesthesia: General | Site: Breast | Laterality: Bilateral

## 2019-02-12 MED ORDER — LACTATED RINGERS IV SOLN
INTRAVENOUS | Status: DC
  Administered 2019-02-12 (×2): via INTRAVENOUS

## 2019-02-12 MED ORDER — IBUPROFEN 600 MG PO TABS
600.0000 mg | ORAL_TABLET | Freq: Four times a day (QID) | ORAL | Status: DC
  Filled 2019-02-12: qty 1

## 2019-02-12 MED ORDER — OXYCODONE HCL 5 MG PO TABS: 5.0000 mg | ORAL_TABLET | Freq: Once | ORAL | Status: DC | PRN

## 2019-02-12 MED ORDER — CHLORHEXIDINE GLUCONATE CLOTH 2 % EX PADS: 6.0000 | MEDICATED_PAD | Freq: Once | CUTANEOUS | Status: DC

## 2019-02-12 MED ORDER — MIDAZOLAM HCL 2 MG/2ML IJ SOLN
1.0000 mg | INTRAMUSCULAR | Status: DC | PRN
  Administered 2019-02-12: 2 mg via INTRAVENOUS

## 2019-02-12 MED ORDER — MENTHOL 3 MG MT LOZG: 1.0000 | LOZENGE | OROMUCOSAL | Status: DC | PRN

## 2019-02-12 MED ORDER — NITROGLYCERIN 2 % TD OINT
TOPICAL_OINTMENT | TRANSDERMAL | Status: AC
  Filled 2019-02-12: qty 30

## 2019-02-12 MED ORDER — FENTANYL CITRATE (PF) 100 MCG/2ML IJ SOLN: 25.0000 ug | INTRAMUSCULAR | Status: DC | PRN

## 2019-02-12 MED ORDER — LIDOCAINE-EPINEPHRINE 1 %-1:100000 IJ SOLN
INTRAMUSCULAR | Status: DC | PRN
  Administered 2019-02-12: 20 mL

## 2019-02-12 MED ORDER — DIPHENHYDRAMINE HCL 12.5 MG/5ML PO ELIX: 12.5000 mg | ORAL_SOLUTION | Freq: Four times a day (QID) | ORAL | Status: DC | PRN

## 2019-02-12 MED ORDER — OXYCODONE HCL 5 MG/5ML PO SOLN: 5.0000 mg | Freq: Once | ORAL | Status: DC | PRN

## 2019-02-12 MED ORDER — CEFAZOLIN SODIUM-DEXTROSE 2-4 GM/100ML-% IV SOLN
INTRAVENOUS | Status: AC
  Filled 2019-02-12: qty 100

## 2019-02-12 MED ORDER — SCOPOLAMINE 1 MG/3DAYS TD PT72
MEDICATED_PATCH | TRANSDERMAL | Status: AC
  Filled 2019-02-12: qty 1

## 2019-02-12 MED ORDER — NAPROXEN 500 MG PO TABS: 500.0000 mg | ORAL_TABLET | Freq: Two times a day (BID) | ORAL | Status: DC | PRN

## 2019-02-12 MED ORDER — NITROGLYCERIN 2 % TD OINT
0.5000 [in_us] | TOPICAL_OINTMENT | Freq: Three times a day (TID) | TRANSDERMAL | Status: DC
  Administered 2019-02-12 – 2019-02-13 (×2): 0.5 [in_us] via TOPICAL

## 2019-02-12 MED ORDER — LIDOCAINE HCL (PF) 1 % IJ SOLN
INTRAMUSCULAR | Status: AC
  Filled 2019-02-12: qty 60

## 2019-02-12 MED ORDER — SENNA 8.6 MG PO TABS
1.0000 | ORAL_TABLET | Freq: Two times a day (BID) | ORAL | Status: DC
  Administered 2019-02-13: 8.6 mg via ORAL
  Filled 2019-02-12: qty 1

## 2019-02-12 MED ORDER — FENTANYL CITRATE (PF) 100 MCG/2ML IJ SOLN
INTRAMUSCULAR | Status: AC
  Filled 2019-02-12: qty 2

## 2019-02-12 MED ORDER — PROMETHAZINE HCL 25 MG/ML IJ SOLN: 6.2500 mg | INTRAMUSCULAR | Status: DC | PRN

## 2019-02-12 MED ORDER — PROPOFOL 10 MG/ML IV BOLUS
INTRAVENOUS | Status: AC
  Filled 2019-02-12: qty 20

## 2019-02-12 MED ORDER — KCL IN DEXTROSE-NACL 20-5-0.45 MEQ/L-%-% IV SOLN
INTRAVENOUS | Status: DC
  Administered 2019-02-12: 19:00:00 via INTRAVENOUS
  Filled 2019-02-12: qty 1000

## 2019-02-12 MED ORDER — MORPHINE SULFATE (PF) 4 MG/ML IV SOLN: 2.0000 mg | INTRAVENOUS | Status: DC | PRN

## 2019-02-12 MED ORDER — DEXAMETHASONE SODIUM PHOSPHATE 4 MG/ML IJ SOLN
INTRAMUSCULAR | Status: DC | PRN
  Administered 2019-02-12: 10 mg via INTRAVENOUS

## 2019-02-12 MED ORDER — EPHEDRINE SULFATE-NACL 50-0.9 MG/10ML-% IV SOSY
PREFILLED_SYRINGE | INTRAVENOUS | Status: DC | PRN
  Administered 2019-02-12: 5 mg via INTRAVENOUS
  Administered 2019-02-12: 15 mg via INTRAVENOUS

## 2019-02-12 MED ORDER — BISACODYL 10 MG RE SUPP: 10.0000 mg | Freq: Every day | RECTAL | Status: DC | PRN

## 2019-02-12 MED ORDER — FENTANYL CITRATE (PF) 100 MCG/2ML IJ SOLN
50.0000 ug | INTRAMUSCULAR | Status: AC | PRN
  Administered 2019-02-12 (×2): 50 ug via INTRAVENOUS
  Administered 2019-02-12: 100 ug via INTRAVENOUS
  Administered 2019-02-12 (×5): 50 ug via INTRAVENOUS

## 2019-02-12 MED ORDER — SUGAMMADEX SODIUM 500 MG/5ML IV SOLN
INTRAVENOUS | Status: DC | PRN
  Administered 2019-02-12: 300 mg via INTRAVENOUS

## 2019-02-12 MED ORDER — ONDANSETRON HCL 4 MG/2ML IJ SOLN: 4.0000 mg | Freq: Once | INTRAMUSCULAR | Status: DC | PRN

## 2019-02-12 MED ORDER — PHENYLEPHRINE HCL (PRESSORS) 10 MG/ML IV SOLN
INTRAVENOUS | Status: DC | PRN
  Administered 2019-02-12: 80 ug via INTRAVENOUS
  Administered 2019-02-12 (×2): 120 ug via INTRAVENOUS

## 2019-02-12 MED ORDER — PROPOFOL 10 MG/ML IV BOLUS
INTRAVENOUS | Status: DC | PRN
  Administered 2019-02-12: 200 mg via INTRAVENOUS

## 2019-02-12 MED ORDER — SCOPOLAMINE 1 MG/3DAYS TD PT72
1.0000 | MEDICATED_PATCH | TRANSDERMAL | Status: DC
  Administered 2019-02-12: 1.5 mg via TRANSDERMAL

## 2019-02-12 MED ORDER — ONDANSETRON 4 MG PO TBDP: 4.0000 mg | ORAL_TABLET | Freq: Four times a day (QID) | ORAL | Status: DC | PRN

## 2019-02-12 MED ORDER — CEFAZOLIN SODIUM-DEXTROSE 2-4 GM/100ML-% IV SOLN
2.0000 g | INTRAVENOUS | Status: AC
  Administered 2019-02-12: 2 g via INTRAVENOUS

## 2019-02-12 MED ORDER — PROPOFOL 500 MG/50ML IV EMUL
INTRAVENOUS | Status: DC | PRN
  Administered 2019-02-12: 15 ug/kg/min via INTRAVENOUS

## 2019-02-12 MED ORDER — HYDROCODONE-ACETAMINOPHEN 5-325 MG PO TABS
1.0000 | ORAL_TABLET | Freq: Four times a day (QID) | ORAL | Status: DC | PRN
  Administered 2019-02-12: 1 via ORAL
  Administered 2019-02-13 (×2): 2 via ORAL
  Filled 2019-02-12: qty 1
  Filled 2019-02-12 (×2): qty 2

## 2019-02-12 MED ORDER — ONDANSETRON HCL 4 MG/2ML IJ SOLN: 4.0000 mg | Freq: Four times a day (QID) | INTRAMUSCULAR | Status: DC | PRN

## 2019-02-12 MED ORDER — EPINEPHRINE PF 1 MG/ML IJ SOLN
INTRAMUSCULAR | Status: AC
  Filled 2019-02-12: qty 1

## 2019-02-12 MED ORDER — DIPHENHYDRAMINE HCL 50 MG/ML IJ SOLN: 12.5000 mg | Freq: Four times a day (QID) | INTRAMUSCULAR | Status: DC | PRN

## 2019-02-12 MED ORDER — CEFAZOLIN SODIUM-DEXTROSE 2-4 GM/100ML-% IV SOLN
2.0000 g | Freq: Three times a day (TID) | INTRAVENOUS | Status: DC
  Administered 2019-02-13: 2 g via INTRAVENOUS

## 2019-02-12 MED ORDER — MIDAZOLAM HCL 2 MG/2ML IJ SOLN
INTRAMUSCULAR | Status: AC
  Filled 2019-02-12: qty 2

## 2019-02-12 MED ORDER — POLYETHYLENE GLYCOL 3350 17 G PO PACK: 17.0000 g | PACK | Freq: Every day | ORAL | Status: DC | PRN

## 2019-02-12 MED ORDER — ROCURONIUM BROMIDE 100 MG/10ML IV SOLN
INTRAVENOUS | Status: DC | PRN
  Administered 2019-02-12: 100 mg via INTRAVENOUS

## 2019-02-12 MED ORDER — DIPHENHYDRAMINE HCL 50 MG/ML IJ SOLN
INTRAMUSCULAR | Status: DC | PRN
  Administered 2019-02-12: 6.25 mg via INTRAVENOUS

## 2019-02-12 MED ORDER — ACETAMINOPHEN 325 MG PO TABS: 325.0000 mg | ORAL_TABLET | Freq: Three times a day (TID) | ORAL | Status: DC | PRN

## 2019-02-12 SURGICAL SUPPLY — 64 items
BAG DECANTER FOR FLEXI CONT (MISCELLANEOUS) ×2 IMPLANT
BINDER BREAST LRG (GAUZE/BANDAGES/DRESSINGS) IMPLANT
BINDER BREAST MEDIUM (GAUZE/BANDAGES/DRESSINGS) IMPLANT
BINDER BREAST XLRG (GAUZE/BANDAGES/DRESSINGS) IMPLANT
BINDER BREAST XXLRG (GAUZE/BANDAGES/DRESSINGS) IMPLANT
BIOPATCH RED 1 DISK 7.0 (GAUZE/BANDAGES/DRESSINGS) IMPLANT
BLADE HEX COATED 2.75 (ELECTRODE) ×2 IMPLANT
BLADE KNIFE PERSONA 10 (BLADE) ×4 IMPLANT
BLADE SURG 15 STRL LF DISP TIS (BLADE) ×1 IMPLANT
BLADE SURG 15 STRL SS (BLADE) ×1
CANISTER SUCT 1200ML W/VALVE (MISCELLANEOUS) ×2 IMPLANT
COVER BACK TABLE REUSABLE LG (DRAPES) ×2 IMPLANT
COVER MAYO STAND REUSABLE (DRAPES) ×2 IMPLANT
COVER SURGICAL LIGHT HANDLE (MISCELLANEOUS) ×2 IMPLANT
COVER WAND RF STERILE (DRAPES) IMPLANT
DECANTER SPIKE VIAL GLASS SM (MISCELLANEOUS) ×2 IMPLANT
DERMABOND ADVANCED (GAUZE/BANDAGES/DRESSINGS)
DERMABOND ADVANCED .7 DNX12 (GAUZE/BANDAGES/DRESSINGS) IMPLANT
DRAIN CHANNEL 19F RND (DRAIN) IMPLANT
DRAPE LAPAROSCOPIC ABDOMINAL (DRAPES) ×2 IMPLANT
DRSG AQUACEL AG ADV 3.5X10 (GAUZE/BANDAGES/DRESSINGS) ×4 IMPLANT
DRSG PAD ABDOMINAL 8X10 ST (GAUZE/BANDAGES/DRESSINGS) ×4 IMPLANT
ELECT BLADE 4.0 EZ CLEAN MEGAD (MISCELLANEOUS) ×2
ELECT REM PT RETURN 9FT ADLT (ELECTROSURGICAL) ×2
ELECTRODE BLDE 4.0 EZ CLN MEGD (MISCELLANEOUS) ×1 IMPLANT
ELECTRODE REM PT RTRN 9FT ADLT (ELECTROSURGICAL) ×1 IMPLANT
EVACUATOR SILICONE 100CC (DRAIN) IMPLANT
GAUZE SPONGE 4X4 12PLY STRL LF (GAUZE/BANDAGES/DRESSINGS) IMPLANT
GLOVE BIO SURGEON STRL SZ 6.5 (GLOVE) ×8 IMPLANT
GLOVE BIO SURGEON STRL SZ7 (GLOVE) IMPLANT
GOWN STRL REUS W/ TWL LRG LVL3 (GOWN DISPOSABLE) ×3 IMPLANT
GOWN STRL REUS W/TWL LRG LVL3 (GOWN DISPOSABLE) ×3
NDL SAFETY ECLIPSE 18X1.5 (NEEDLE) IMPLANT
NEEDLE HYPO 18GX1.5 SHARP (NEEDLE)
NEEDLE HYPO 25X1 1.5 SAFETY (NEEDLE) ×2 IMPLANT
NS IRRIG 1000ML POUR BTL (IV SOLUTION) IMPLANT
PACK BASIN DAY SURGERY FS (CUSTOM PROCEDURE TRAY) ×2 IMPLANT
PAD ALCOHOL SWAB (MISCELLANEOUS) IMPLANT
PENCIL SMOKE EVACUATOR (MISCELLANEOUS) ×2 IMPLANT
PIN SAFETY STERILE (MISCELLANEOUS) IMPLANT
SLEEVE SCD COMPRESS KNEE MED (MISCELLANEOUS) ×2 IMPLANT
SPONGE LAP 18X18 RF (DISPOSABLE) ×8 IMPLANT
STAPLER INSORB 30 2030 C-SECTI (MISCELLANEOUS) ×2 IMPLANT
STRIP SUTURE WOUND CLOSURE 1/2 (SUTURE) ×4 IMPLANT
SUT MNCRL AB 4-0 PS2 18 (SUTURE) ×18 IMPLANT
SUT MON AB 3-0 SH 27 (SUTURE) ×4
SUT MON AB 3-0 SH27 (SUTURE) ×4 IMPLANT
SUT MON AB 5-0 PS2 18 (SUTURE) ×10 IMPLANT
SUT PDS 3-0 CT2 (SUTURE)
SUT PDS AB 2-0 CT2 27 (SUTURE) IMPLANT
SUT PDS II 3-0 CT2 27 ABS (SUTURE) IMPLANT
SUT SILK 3 0 PS 1 (SUTURE) IMPLANT
SYR 3ML 23GX1 SAFETY (SYRINGE) IMPLANT
SYR 50ML LL SCALE MARK (SYRINGE) IMPLANT
SYR BULB IRRIGATION 50ML (SYRINGE) ×2 IMPLANT
SYR CONTROL 10ML LL (SYRINGE) ×2 IMPLANT
TAPE MEASURE VINYL STERILE (MISCELLANEOUS) IMPLANT
TOWEL GREEN STERILE FF (TOWEL DISPOSABLE) ×4 IMPLANT
TRAY DSU PREP LF (CUSTOM PROCEDURE TRAY) ×2 IMPLANT
TUBE CONNECTING 20X1/4 (TUBING) ×2 IMPLANT
TUBING INFILTRATION IT-10001 (TUBING) IMPLANT
TUBING SET GRADUATE ASPIR 12FT (MISCELLANEOUS) IMPLANT
UNDERPAD 30X36 HEAVY ABSORB (UNDERPADS AND DIAPERS) ×4 IMPLANT
YANKAUER SUCT BULB TIP NO VENT (SUCTIONS) ×2 IMPLANT

## 2019-02-12 NOTE — OR Nursing (Signed)
18:00 Called Mr. Inthavong to give a status update. LRW

## 2019-02-12 NOTE — Anesthesia Procedure Notes (Signed)
Procedure Name: Intubation Date/Time: 02/12/2019 3:01 PM Performed by: Willa Frater, CRNA Pre-anesthesia Checklist: Patient identified, Emergency Drugs available, Suction available and Patient being monitored Patient Re-evaluated:Patient Re-evaluated prior to induction Oxygen Delivery Method: Circle system utilized Preoxygenation: Pre-oxygenation with 100% oxygen Induction Type: IV induction Ventilation: Mask ventilation without difficulty Laryngoscope Size: Mac and 3 Grade View: Grade II Tube type: Oral Tube size: 7.0 mm Number of attempts: 1 Airway Equipment and Method: Stylet and Oral airway Placement Confirmation: ETT inserted through vocal cords under direct vision,  positive ETCO2 and breath sounds checked- equal and bilateral Secured at: 23 cm Tube secured with: Tape Dental Injury: Teeth and Oropharynx as per pre-operative assessment

## 2019-02-12 NOTE — Interval H&P Note (Signed)
History and Physical Interval Note:  02/12/2019 2:07 PM  Vanessa Dean  has presented today for surgery, with the diagnosis of Mammary hypertrophy.  The various methods of treatment have been discussed with the patient and family. After consideration of risks, benefits and other options for treatment, the patient has consented to  Procedure(s): MAMMARY REDUCTION  (BREAST) (Bilateral) as a surgical intervention.  The patient's history has been reviewed, patient examined, no change in status, stable for surgery.  I have reviewed the patient's chart and labs.  Questions were answered to the patient's satisfaction.     [name]

## 2019-02-12 NOTE — Transfer of Care (Signed)
Immediate Anesthesia Transfer of Care Note  Patient: Vanessa Dean  Procedure(s) Performed: MAMMARY REDUCTION  (BREAST) (Bilateral Breast)  Patient Location: PACU  Anesthesia Type:General  Level of Consciousness: awake, alert  and oriented  Airway & Oxygen Therapy: Patient Spontanous Breathing and Patient connected to face mask oxygen  Post-op Assessment: Report given to RN and Post -op Vital signs reviewed and stable  Post vital signs: Reviewed and stable  Last Vitals:  Vitals Value Taken Time  BP    Temp    Pulse 108 02/12/19 1838  Resp    SpO2 98 % 02/12/19 1838  Vitals shown include unvalidated device data.  Last Pain:  Vitals:   02/12/19 1155  TempSrc: Oral  PainSc: 6       Patients Stated Pain Goal: 3 (07/28/89 0289)  Complications: No apparent anesthesia complications

## 2019-02-12 NOTE — Op Note (Signed)
Breast Reduction Op note:    DATE OF PROCEDURE: 02/12/2019  LOCATION: [facility]  SURGEON: Lyndee Leo Sanger , DO  ASSISTANT:  Phoebe Sharps, PA  PREOPERATIVE DIAGNOSIS 1. Macromastia 2. Neck Pain 3. Back Pain  POSTOPERATIVE DIAGNOSIS 1. Macromastia 2. Neck Pain 3. Back Pain  PROCEDURES 1. Bilateral breast reduction.  Right reduction 1260 g, Left reduction 9628 g  COMPLICATIONS: None.  DRAINS: none  INDICATIONS FOR PROCEDURE Thelia Tanksley is a 53 y.o. year old female born on Nov 25, 1965, with a history of symptomatic macromastia with concominant back pain, neck pain, shoulder grooving from her bra.   MRN: [id number]  CONSENT Informed consent was obtained directly from the patient. The risks, benefits and alternatives were fully discussed. Specific risks including but not limited to bleeding, infection, hematoma, seroma, scarring, pain, nipple necrosis, asymmetry, poor cosmetic results, and need for further surgery were discussed. The patient had ample opportunity to have her questions answered to her satisfaction.  DESCRIPTION OF PROCEDURE  Patient was brought into the operating room and placed in a supine position.  SCDs were placed and appropriate padding was performed.  Antibiotics were given. The patient underwent general anesthesia and the chest was prepped and draped in a sterile fashion.  A timeout was performed and all information was confirmed to be correct.  Right:  Preoperative markings were confirmed.  Incision lines were injected with 1% Xylocaine with epinephrine.  After waiting for vasoconstriction, the marked lines were incised.  An inferior pedical breast reduction was performed by de-epithelializing the pedicle, using bovie to create the superior breast pedicle, and removing breast tissue from the inferior portions of the breast.  Care was taken to not undermine the breast pedicle. Hemostasis was achieved.  The nipple was gently  rotated into position and the skin was temporarily closed with the insorb.  The patient was sat upright and size and shape symmetry was confirmed.  The pocket was irrigated and hemostasis confirmed.  The deep tissues were approximated with 3-0 Monocryl sutures and the skin was closed with deep dermal and subcuticular 4-0 Monocryl sutures followed by 5-0 Monocryl.  The nipple areola complex was brought out and secured with the 4-0 and 5-0 Monocryl.  The area was secured with 4-0 Monocryl at the deep layers followed by 5-0 Monocryl.  The nipple and skin flaps had good capillary refill at the end of the procedure.  Left:  Preoperative markings were confirmed.  Incision lines were injected with 1% Xylocaine with epinephrine.  After waiting for vasoconstriction, the marked lines were incised.  An inferior pedical breast reduction was performed by de-epithelializing the pedicle, using bovie to create the superior breast pedicle, and removing breast tissue from the inferior portions of the breast.  Care was taken to not undermine the breast pedicle. Hemostasis was achieved.  The nipple was gently rotated into position and the skin was temporarily closed with the insorb.  The patient was sat upright and size and shape symmetry was confirmed.  The pocket was irrigated and hemostasis confirmed.  The deep tissues were approximated with 3-0 Monocryl sutures and the skin was closed with deep dermal and subcuticular 4-0 Monocryl sutures followed by 5-0 Monocryl.  The nipple areola complex was brought out and secured with the 4-0 and 5-0 Monocryl.  The area was secured with 4-0 Monocryl at the deep layers followed by 5-0 Monocryl.  The nipple and skin flaps had good capillary refill at the end of the procedure. As a precaution the  nitropaste was applied to the complex. The patient tolerated the procedure well. The patient was allowed to wake from anesthesia and taken to the recovery room in satisfactory condition.  The  advanced practice practitioner (APP) assisted throughout the case.  The APP was essential in retraction and counter traction when needed to make the case progress smoothly.  This retraction and assistance made it possible to see the tissue plans for the procedure.  The assistance was needed for blood control, tissue re-approximation and assisted with closure of the incision site.

## 2019-02-12 NOTE — Discharge Instructions (Signed)
INSTRUCTIONS FOR AFTER SURGERY   You will likely have some questions about what to expect following your operation.  The following information will help you and your family understand what to expect when you are discharged from the hospital.  Following these guidelines will help ensure a smooth recovery and reduce risks of complications.  Postoperative instructions include information on: diet, wound care, medications and physical activity.  AFTER SURGERY Expect to go home after the procedure.  In some cases, you may need to spend one night in the hospital for observation.  DIET This surgery does not require a specific diet.  However, I have to mention that the healthier you eat the better your body can start healing. It is important to increasing your protein intake.  This means limiting the foods with added sugar.  Focus on fruits and vegetables and some meat.  If you have any liposuction during your procedure be sure to drink water.  If your urine is bright yellow, then it is concentrated, and you need to drink more water.  As a general rule after surgery, you should have 8 ounces of water every hour while awake.  If you find you are persistently nauseated or unable to take in liquids let us know.  NO TOBACCO USE or EXPOSURE.  This will slow your healing process and increase the risk of a wound.  WOUND CARE If you have a drain: Clean with baby wipes until the drain is removed.   If you have steri-strips / tape directly attached to your skin leave them in place. It is OK to get these wet.  No baths, pools or hot tubs for two weeks. We close your incision to leave the smallest and best-looking scar. No ointment or creams on your incisions until given the go ahead.  Especially not Neosporin (Too many skin reactions with this one).  A few weeks after surgery you can use Mederma and start massaging the scar. We ask you to wear your binder or sports bra for the first 6 weeks around the clock, including  while sleeping. This provides added comfort and helps reduce the fluid accumulation at the surgery site.  ACTIVITY No heavy lifting until cleared by the doctor.  It is OK to walk and climb stairs. In fact, moving your legs is very important to decrease your risk of a blood clot.  It will also help keep you from getting deconditioned.  Every 1 to 2 hours get up and walk for 5 minutes. This will help with a quicker recovery back to normal.  Let pain be your guide so you don't do too much.  NO, you cannot do the spring cleaning and don't plan on taking care of anyone else.  This is your time for TLC.   WORK Everyone returns to work at different times. As a rough guide, most people take at least 1 - 2 weeks off prior to returning to work. If you need documentation for your job, bring the forms to your postoperative follow up visit.  DRIVING Arrange for someone to bring you home from the hospital.  You may be able to drive a few days after surgery but not while taking any narcotics or valium.  BOWEL MOVEMENTS Constipation can occur after anesthesia and while taking pain medication.  It is important to stay ahead for your comfort.  We recommend taking Milk of Magnesia (2 tablespoons; twice a day) while taking the pain pills.  SEROMA This is fluid your body tried  to put in the surgical site.  This is normal but if it creates excessive pain and swelling let us know.  It usually decreases in a few weeks.  MEDICATIONS and PAIN CONTROL At your preoperative visit for you history and physical you were given the following medications: 1. An antibiotic: Start this medication when you get home and take according to the instructions on the bottle. 2. Zofran 4 mg:  This is to treat nausea and vomiting.  You can take this every 6 hours as needed and only if needed. 3. Norco (hydrocodone/acetaminophen) 5/325 mg:  This is only to be used after you have taken the motrin or the tylenol. Every 8 hours as needed. Over  the counter Medication to take: 4. Ibuprofen (Motrin) 600 mg:  Take this every 6 hours.  If you have additional pain then take 500 mg of the tylenol.  Only take the Norco after you have tried these two. 5. Miralax or stool softener of choice: Take this according to the bottle if you take the [place] Call your surgeon's office if any of the following occur:  Fever 101 degrees F or greater  Excessive bleeding or fluid from the incision site.  Pain that increases over time without aid from the medications  Redness, warmth, or pus draining from incision sites  Persistent nausea or inability to take in liquids  Severe misshapen area that underwent the operation.

## 2019-02-13 ENCOUNTER — Encounter: Payer: Self-pay | Admitting: *Deleted

## 2019-02-13 DIAGNOSIS — N62 Hypertrophy of breast: Secondary | ICD-10-CM | POA: Diagnosis not present

## 2019-02-13 MED ORDER — CEFAZOLIN SODIUM-DEXTROSE 2-4 GM/100ML-% IV SOLN
INTRAVENOUS | Status: AC
  Filled 2019-02-13: qty 100

## 2019-02-13 NOTE — Discharge Summary (Signed)
Physician Discharge Summary  Patient ID: Vanessa Dean MRN: [id number] DOB/AGE: 52/04/1965 52 y.o.  Admit date: 02/12/2019 Discharge date: 02/13/2019  Admission Diagnoses: Symptomatic mammary hypertrophy  Discharge Diagnoses:  Active Problems:   Symptomatic mammary hypertrophy   Discharged Condition: good  Hospital Course: Vanessa Dean had bilateral breast reduction on 12/9 with Dr. Marla Roe. No overnight events. This morning patient is alert and awake sitting up in bed eating breakfast. Reports she is feeling well. Reports pain is well controlled. Denies fever, CP, SOB, N/V. Wearing scopolamine patch from surgery. Incisions are intact, minimal dried blood on bandages. No signs of seroma/hematoma. No visible drainage.  Skin flaps and nipples have good color.  Nitropaste was reapplied as planned at bedside. She is very happy with her results so far. Excited that she feels some relief from previous neck/back pain already.  Consults: None  Significant Diagnostic Studies: none  Treatments: surgery: bilateral breast reduction  Discharge Exam: Blood pressure 106/64, pulse 62, temperature 97.6 F (36.4 C), resp. rate 16, height 5' 5"  (1.651 m), weight 126.8 kg, SpO2 98 %. General appearance: alert, cooperative and no distress Head: Normocephalic, without obvious abnormality, atraumatic Eyes: EOMs intact Resp: normal effort Breasts: incisions intact, minimal dried blood on bandages; no signs of drainage, seroma, hematoma; good color  Disposition: Discharge disposition: 01-Home or Self Care        Allergies as of 02/13/2019      Reactions   Oxycodone-acetaminophen Rash      Medication List    ASK your doctor about these medications   atorvastatin 10 MG tablet Commonly known as: LIPITOR Take 10 mg by mouth every evening.   CINNAMON PO Take by mouth.   multivitamin with minerals tablet Take 1 tablet by mouth daily.   ondansetron 4 MG tablet Commonly known as:  Zofran Take 1 tablet (4 mg total) by mouth every 8 (eight) hours as needed for nausea or vomiting.   valsartan-hydrochlorothiazide 320-25 MG tablet Commonly known as: DIOVAN-HCT Take 1 tablet by mouth daily.      Follow-up Information    Dillingham, Loel Lofty, DO In 1 week.   Specialty: Plastic Surgery Contact information: [address] [address] [phone]          Dr. Lyndee Leo [facility] Dillingham [address] [place], [place]  [address] [phone]  Signed: Threasa Heads 02/13/2019, 9:07 PM

## 2019-02-14 LAB — SURGICAL PATHOLOGY

## 2019-02-14 NOTE — Anesthesia Postprocedure Evaluation (Signed)
Anesthesia Post Note  Patient: Vanessa Dean  Procedure(s) Performed: MAMMARY REDUCTION  (BREAST) (Bilateral Breast)     Patient location during evaluation: PACU Anesthesia Type: General Level of consciousness: awake and alert Pain management: pain level controlled Vital Signs Assessment: post-procedure vital signs reviewed and stable Respiratory status: spontaneous breathing, nonlabored ventilation, respiratory function stable and patient connected to nasal cannula oxygen Cardiovascular status: blood pressure returned to baseline and stable Postop Assessment: no apparent nausea or vomiting Anesthetic complications: no    Last Vitals:  Vitals:   02/13/19 0500 02/13/19 0600  BP:  106/64  Pulse:  62  Resp:  16  Temp:  36.4 C  SpO2: 98% 98%    Last Pain:  Vitals:   02/13/19 0730  TempSrc:   PainSc: [place]

## 2019-02-18 ENCOUNTER — Other Ambulatory Visit: Payer: Self-pay

## 2019-02-18 ENCOUNTER — Ambulatory Visit (INDEPENDENT_AMBULATORY_CARE_PROVIDER_SITE_OTHER): Payer: BC Managed Care – PPO | Admitting: Plastic Surgery

## 2019-02-18 ENCOUNTER — Encounter: Payer: BC Managed Care – PPO | Admitting: Surgical

## 2019-02-18 VITALS — BP 106/70 | HR 85 | Temp 98.8°F | Ht 65.0 in | Wt 279.0 lb

## 2019-02-18 DIAGNOSIS — M546 Pain in thoracic spine: Secondary | ICD-10-CM

## 2019-02-18 DIAGNOSIS — Z9889 Other specified postprocedural states: Secondary | ICD-10-CM | POA: Insufficient documentation

## 2019-02-18 DIAGNOSIS — M542 Cervicalgia: Secondary | ICD-10-CM

## 2019-02-18 DIAGNOSIS — G8929 Other chronic pain: Secondary | ICD-10-CM

## 2019-02-18 DIAGNOSIS — N62 Hypertrophy of breast: Secondary | ICD-10-CM

## 2019-02-18 NOTE — Progress Notes (Signed)
Subjective:     Patient ID: Vanessa Dean, female    DOB: 1965-05-18, 53 y.o.   MRN: [id number]  No chief complaint on file.   HPI: The patient is a 53 y.o. female here for follow-up after bilateral breast reduction on 02/12/2019 with Dr. Marla Roe.  She reports she is doing well.  Has mild pain on her left breast that is easily controlled with ibuprofen or Tylenol.  States she thinks the left breast is slightly more swollen than the right.  Denies any drainage, bleeding, redness.  Denies fever, chest pain, shortness of breath, nausea/vomiting, leg swelling.  Has been using the nitropaste on the NAC. Feels that neck /back pain has improved some.  Pathology Findings: A. Breast, right, mammoplasty: benign fibrocystic changes B. Breast, left, mammoplasty: benign fibrocystic changes C. Lymph node, left biopsy: One morphologically benign lymph node  Bilateral breast reduction: Right reduction 1260 g, Left reduction 1550 g.  Review of Systems  Constitutional: Negative for chills and fever.  Respiratory: Negative for shortness of breath.   Cardiovascular: Negative for chest pain and leg swelling.  Gastrointestinal: Negative for nausea and vomiting.  Skin: Negative for color change and rash.       Feels left breast is a little more swollen than the right, mild pain of left breast. Some numbness.     Objective:   Vital Signs BP 106/70 (BP Location: Left Arm, Patient Position: Sitting, Cuff Size: Large)   Pulse 85   Temp 98.8 F (37.1 C) (Temporal)   Ht 5' 5"  (1.651 m)   Wt 279 lb (126.6 kg)   SpO2 98%   BMI 46.43 kg/m  Vital Signs and Nursing Note Reviewed  Physical Exam  Constitutional: She is oriented to person, place, and time and well-developed, well-nourished, and in no distress.  HENT:  Head: Normocephalic and atraumatic.  Eyes: EOM are normal.  Pulmonary/Chest: Effort normal.    Incisions c/d/i. Dermabond present. Steristrips and dressing present on vertical limb  incision and IMF incision.  Good cap refill bilaterally of NAC. Left breast looks slightly larger than the right. Breasts are soft bilaterally, no firmness, warmth, or redness noted. No signs of infection.   Musculoskeletal:        General: Normal range of motion.     Cervical back: Normal range of motion.  Neurological: She is alert and oriented to person, place, and time. Gait normal.  Skin: Skin is warm and dry. No rash noted. No erythema. No pallor.  Psychiatric: Mood, memory, affect and judgment normal.      Assessment/Plan:     ICD-10-CM   1. Symptomatic mammary hypertrophy  N62   2. S/P bilateral breast reduction  Z98.890   3. Chronic bilateral thoracic back pain  M54.6    G89.29   4. Neck pain  M54.2     [name] is doing very well.  Incisions c/d/i, good cap refill bilaterally of NAC.  Mild tenderness of left breast, easily controlled with tylenol/ibuprofen. No firmness, redness, drainage, or signs of infection noted.  Steristrips and dressing in place. She reports some relief already of the back/neck pain. She is very pleased with her results.  Stop using the nitropaste. Apply Vaseline and ABD to cover incisions. Wear binder or sports bra 24/7 (except when showering). When showering avoid letting water hit directly on incisions. Let water run down from shoulders over chest. Pat dry.   Follow-up 1 week. Call office with any questions or concerns, including but not  limited to fever/chills, one breast gets significantly larger and/or become painful to the touch, significant drainage, or signs of infection.   Threasa Heads, PA-C 02/18/2019, 11:04 AM

## 2019-02-22 ENCOUNTER — Encounter: Payer: Self-pay | Admitting: Plastic Surgery

## 2019-02-24 ENCOUNTER — Other Ambulatory Visit: Payer: Self-pay

## 2019-02-24 ENCOUNTER — Encounter: Payer: Self-pay | Admitting: Nurse Practitioner

## 2019-02-24 ENCOUNTER — Ambulatory Visit (INDEPENDENT_AMBULATORY_CARE_PROVIDER_SITE_OTHER): Payer: BC Managed Care – PPO | Admitting: Nurse Practitioner

## 2019-02-24 VITALS — BP 134/78 | HR 85 | Temp 98.0°F | Ht 65.0 in | Wt 276.2 lb

## 2019-02-24 DIAGNOSIS — Z9889 Other specified postprocedural states: Secondary | ICD-10-CM

## 2019-02-24 NOTE — Telephone Encounter (Signed)
Pt was seen in office today- by [facility] Please see notes

## 2019-02-24 NOTE — Progress Notes (Signed)
Patient ID: Vanessa Dean, female    DOB: 1965/03/25, 53 y.o.   MRN: [id number]   C.C.: follow up breast reduction  Vanessa Dean is a 53 yo female who presents for follow up after bilateral breast reduction by Dr. Marla Roe on 02/12/19. She is feeling well today. She has some tenderness in her bilateral axilla. She noticed some blood tinged drainage from her left lateral incision last Friday. It continued to drain over the weekend, but less this morning. She feels a little swollen in both breasts. She thinks she may have been a little too active yesterday. She is concerned that the left nipple doesn't look even.   Review of Systems  Constitutional: Positive for activity change.  HENT: Negative.   Respiratory: Negative.   Cardiovascular: Negative.   Gastrointestinal: Negative.   Genitourinary: Negative.   Musculoskeletal:       Bilateral breast soreness and numbness  Skin: Positive for wound.  Neurological: Negative.     Past Medical History:  Diagnosis Date  . Anemia   . Arthritis   . Crohn disease ([place])   . Fluid retention in legs   . Hypertension   . PONV (postoperative nausea and vomiting)   . Pre-diabetes   . Seasonal allergies    TAKES ZYRTEC AS NEEDED  . Sleep apnea    cpap at night  . Snores     Past Surgical History:  Procedure Laterality Date  . ABDOMINAL HYSTERECTOMY    . APPENDECTOMY    . BREAST REDUCTION SURGERY Bilateral 02/12/2019   Procedure: MAMMARY REDUCTION  (BREAST);  Surgeon: Wallace Going, DO;  Location: [place];  Service: Plastics;  Laterality: Bilateral;  . CARPAL TUNNEL RELEASE  05/16/2011   Procedure: CARPAL TUNNEL RELEASE;  Surgeon: Cammie Sickle., MD;  Location: [place];  Service: Orthopedics;  Laterality: Left;  . CARPAL TUNNEL RELEASE  08/08/2011   Procedure: CARPAL TUNNEL RELEASE;  Surgeon: Cammie Sickle., MD;  Location: [place];  Service: Orthopedics;  Laterality:  Right;  . CHOLECYSTECTOMY    . COLON SURGERY     patient had intestinal blockage  . COLONOSCOPY    . ESOPHAGOGASTRODUODENOSCOPY    . GASTRIC BYPASS    . KNEE ARTHROSCOPY  05/09/2010   right   . KNEE ARTHROSCOPY  05/10/2007   left  . LUMBAR LAMINECTOMY/DECOMPRESSION MICRODISCECTOMY N/A 10/31/2017   Procedure: LUMBAR LAMINECTOMY/DECOMPRESSION MICRODISCECTOMY ONE LEVEL  LUMBAR FOUR - LUMBAR FIVE DECOMPRESSION;  Surgeon: Melina Schools, MD;  Location: [facility];  Service: Orthopedics;  Laterality: N/A;  LUMBAR LAMINECTOMY/DECOMPRESSION MICRODISCECTOMY ONE LEVEL  LUMBAR FOUR - LUMBAR FIVE DECOMPRESSION  . TOTAL VAGINAL HYSTERECTOMY  07/20/2005   posterior colporrhaphy      Current Outpatient Medications:  .  atorvastatin (LIPITOR) 10 MG tablet, Take 10 mg by mouth every evening., Disp: , Rfl: 1 .  CINNAMON PO, Take by mouth., Disp: , Rfl:  .  Multiple Vitamins-Minerals (MULTIVITAMIN WITH MINERALS) tablet, Take 1 tablet by mouth daily., Disp: , Rfl:  .  valsartan-hydrochlorothiazide (DIOVAN-HCT) 320-25 MG tablet, Take 1 tablet by mouth daily., Disp: , Rfl: 0   Objective:   Vitals:   02/24/19 1104  BP: 134/78  Pulse: 85  Temp: 98 F (36.7 C)  SpO2: 99%    Physical Exam  General: alert, calm, no acute distress HEENT:normocephalic Neck: full ROM Chest: symmetrical rise and fall Lungs: unlabored breathing Breast: incisions clean, dry, intact, approximated, transverse incisions  covered with duoderm dressing; mild to moderate edema to bilateral breasts L>R,  no obvious fluid wave mild tenderness in bilateral axilla; left nipple slightly deviated to left; 2 cm x 0.5 cm open wound to lateral endpoint of left transverse incision, no drainage observed Abdomen: soft, non-distended Musculoskeletal: MAEx4 Neuro: A&O x3, calm, cooperative, steady gait Skin: warm, dry     Assessment & Plan:  S/P bilateral breast reduction  Vanessa Dean is a 53 yo female POD #12 s/p bilateral breast reduction.  She has a small wound on the left  transverse incision that has recently drained serosanguinous output. There was no obvious infection or seroma. The wound was covered with vaseline and bandaid. Both breasts are still fairly swollen, worse on the left. Expect the swelling is contributing to the left nipple deviation. No signs of necrosis. This should improve with time. Continue wearing the sports bra 24/7. Return in 1 week.   Picture placed in chart with patient's consent.  Alfredo Batty, NP

## 2019-03-03 ENCOUNTER — Encounter: Payer: Self-pay | Admitting: Nurse Practitioner

## 2019-03-03 ENCOUNTER — Ambulatory Visit (INDEPENDENT_AMBULATORY_CARE_PROVIDER_SITE_OTHER): Payer: BC Managed Care – PPO | Admitting: Nurse Practitioner

## 2019-03-03 ENCOUNTER — Other Ambulatory Visit: Payer: Self-pay

## 2019-03-03 VITALS — BP 109/77 | HR 91 | Temp 98.2°F | Ht 60.0 in | Wt 273.4 lb

## 2019-03-03 DIAGNOSIS — Z9889 Other specified postprocedural states: Secondary | ICD-10-CM

## 2019-03-03 NOTE — Progress Notes (Signed)
Patient ID: Vanessa Dean, female    DOB: [birth year], 53 y.o.   MRN: [id number]  C.C.: soreness in left breast  Vanessa Dean is a 53 yo female who presents for follow up after bilateral breast reduction and left axilla node biopsy by Dr. Marla Roe on 02/12/19. She continues to have soreness in her left breast and axilla. She has been taking tylenol PM "every now and then." She has a small wound on her left breast transverse incision that was draining serosanguinous fluid at one point. There has no drainage over the past week. Patient states the right side "seems more natural." Patient states she noticed soreness on her left chest and abdomen below the incision immediately after surgery. The area became very tender last night. Patient can also feel "something hard" in the area.    Review of Systems  Constitutional: Positive for activity change.  HENT: Negative.   Respiratory: Negative.   Cardiovascular: Negative.   Gastrointestinal:       Soreness on abdomen below left breast  Genitourinary: Negative.   Musculoskeletal:       Left breast and axilla pain  Skin: Positive for wound.    Past Medical History:  Diagnosis Date  . Anemia   . Arthritis   . Crohn disease ([place])   . Fluid retention in legs   . Hypertension   . PONV (postoperative nausea and vomiting)   . Pre-diabetes   . Seasonal allergies    TAKES ZYRTEC AS NEEDED  . Sleep apnea    cpap at night  . Snores     Past Surgical History:  Procedure Laterality Date  . ABDOMINAL HYSTERECTOMY    . APPENDECTOMY    . BREAST REDUCTION SURGERY Bilateral 02/12/2019   Procedure: MAMMARY REDUCTION  (BREAST);  Surgeon: Wallace Going, DO;  Location: [place];  Service: Plastics;  Laterality: Bilateral;  . CARPAL TUNNEL RELEASE  05/16/2011   Procedure: CARPAL TUNNEL RELEASE;  Surgeon: Cammie Sickle., MD;  Location: [place];  Service: Orthopedics;  Laterality: Left;  . CARPAL TUNNEL  RELEASE  08/08/2011   Procedure: CARPAL TUNNEL RELEASE;  Surgeon: Cammie Sickle., MD;  Location: [place];  Service: Orthopedics;  Laterality: Right;  . CHOLECYSTECTOMY    . COLON SURGERY     patient had intestinal blockage  . COLONOSCOPY    . ESOPHAGOGASTRODUODENOSCOPY    . GASTRIC BYPASS    . KNEE ARTHROSCOPY  05/09/2010   right   . KNEE ARTHROSCOPY  05/10/2007   left  . LUMBAR LAMINECTOMY/DECOMPRESSION MICRODISCECTOMY N/A 10/31/2017   Procedure: LUMBAR LAMINECTOMY/DECOMPRESSION MICRODISCECTOMY ONE LEVEL  LUMBAR FOUR - LUMBAR FIVE DECOMPRESSION;  Surgeon: Melina Schools, MD;  Location: [facility] [place];  Service: Orthopedics;  Laterality: N/A;  LUMBAR LAMINECTOMY/DECOMPRESSION MICRODISCECTOMY ONE LEVEL  LUMBAR FOUR - LUMBAR FIVE DECOMPRESSION  . TOTAL VAGINAL HYSTERECTOMY  07/20/2005   posterior colporrhaphy      Current Outpatient Medications:  .  atorvastatin (LIPITOR) 10 MG tablet, Take 10 mg by mouth every evening., Disp: , Rfl: 1 .  valsartan-hydrochlorothiazide (DIOVAN-HCT) 320-25 MG tablet, Take 1 tablet by mouth daily., Disp: , Rfl: 0 .  CINNAMON PO, Take by mouth., Disp: , Rfl:  .  Multiple Vitamins-Minerals (MULTIVITAMIN WITH MINERALS) tablet, Take 1 tablet by mouth daily., Disp: , Rfl:    Objective:   Vitals:   03/03/19 1108  BP: 109/77  Pulse: 91  Temp: 98.2 F (36.8 C)  SpO2: 100%    Physical Exam  General: alert, calm, no acute distress HEENT:normocephalic Neck: full ROM Chest: symmetrical rise and fall; firm, tender, linear band-like tissue palpated inferior and left of vertical limb and transverse junction and extending ~8 cm distal from left breast   Lungs: unlabored breathing Breast: incisions clean, dry, intact, approximated; mild edema to bilateral breasts L>R,  no obvious fluid wave mild tenderness in left axilla; left nipple slightly deviated to left with very mild erythema at left nipple incision; 1.5 cm x 0.5 cm open wound to lateral endpoint of  left transverse incision, no drainage observed Abdomen: soft, obese, non-distended Musculoskeletal: MAEx4 Neuro: A&O x3, calm, cooperative, steady gait Skin: warm, dry   Assessment & Plan:  S/P bilateral breast reduction  Vanessa Dean is a 53 yo female POD #19 s/p bilateral breast reduction and left axilla lymph node biopsy. She has a small wound on the left  transverse incision that is healing. Continue to apply vaseline. Both breasts appear less swollen than last week. The left breast and axilla area still tender, which is not unexpected. The left axilla lymph node biopsy may be contributing to the swelling and discomfort. Patient was encouraged to take tylenol as needed for pain. Unsure exactly the nature of the band-like area palpated below the left breast incision. Possibly scar tissue. Continue wearing a sports bra 24/7. Very mild erythema at left nipple incision, but slightly more than last week. Will consult with Dr. Marla Roe. Return in 3 weeks.     Picture placed in chart with patient's consent.  Alfredo Batty, NP

## 2019-03-17 ENCOUNTER — Ambulatory Visit: Payer: BC Managed Care – PPO | Attending: Internal Medicine

## 2019-03-17 DIAGNOSIS — Z20822 Contact with and (suspected) exposure to covid-19: Secondary | ICD-10-CM

## 2019-03-18 LAB — NOVEL CORONAVIRUS, NAA: SARS-CoV-2, NAA: NOT DETECTED

## 2019-03-25 ENCOUNTER — Other Ambulatory Visit: Payer: Self-pay

## 2019-03-25 ENCOUNTER — Encounter: Payer: Self-pay | Admitting: Plastic Surgery

## 2019-03-25 ENCOUNTER — Ambulatory Visit (INDEPENDENT_AMBULATORY_CARE_PROVIDER_SITE_OTHER): Payer: BC Managed Care – PPO | Admitting: Plastic Surgery

## 2019-03-25 ENCOUNTER — Telehealth: Payer: Self-pay | Admitting: Plastic Surgery

## 2019-03-25 VITALS — BP 140/80 | HR 76 | Temp 97.3°F | Ht 65.0 in | Wt 275.8 lb

## 2019-03-25 DIAGNOSIS — Z9889 Other specified postprocedural states: Secondary | ICD-10-CM

## 2019-03-25 NOTE — Progress Notes (Signed)
The patient is a 54 year old female here for follow-up on her bilateral breast reduction.  Overall she is doing extremely well.  We reviewed her pictures from her preop.  She is extremely pleased with her results.  All the skin has healed.  There is a little bit of scarring deep on the left lateral side.  This is pulling her nipple a little bit laterally.  She also feels some tenderness on the upper abdomen of the left side superficial to her rib cage.  No sign of infection.  All incisions healing nicely.  Continue with sports bra or the breast binder.  She can do a regular bra for short periods during the day.  She can come out of the sports bra at night and 1 month.  Keep massaging.  This is extremely important.  I had like to see her back in 3 months.

## 2019-03-25 NOTE — Telephone Encounter (Signed)
Pt forgot to ask Dr. Marla Roe if she could start using mederna on her scars. Please call pt back to advise.

## 2019-03-25 NOTE — Telephone Encounter (Signed)
Called and [facility] @ 12:12pm) informing the patient that per Dr. Marla Roe yes she can go ahead and start using the [place] on her scars.  She can also use Silicone scar sheets at night and she can get those off [facility].  Informed the patient to give me a call back if she has any questions.//AB/CMA

## 2019-04-08 ENCOUNTER — Other Ambulatory Visit: Payer: Self-pay | Admitting: Family Medicine

## 2019-04-08 DIAGNOSIS — H93A2 Pulsatile tinnitus, left ear: Secondary | ICD-10-CM

## 2019-04-23 ENCOUNTER — Other Ambulatory Visit: Payer: Self-pay

## 2019-04-23 ENCOUNTER — Ambulatory Visit
Admission: RE | Admit: 2019-04-23 | Discharge: 2019-04-23 | Disposition: A | Discharge status: Home / Self Care | Payer: BC Managed Care – PPO | Source: Ambulatory Visit | Attending: Family Medicine | Admitting: Family Medicine

## 2019-04-23 DIAGNOSIS — H93A2 Pulsatile tinnitus, left ear: Secondary | ICD-10-CM

## 2019-04-28 ENCOUNTER — Other Ambulatory Visit: Payer: Self-pay | Admitting: Family Medicine

## 2019-04-28 DIAGNOSIS — H93A2 Pulsatile tinnitus, left ear: Secondary | ICD-10-CM

## 2019-05-01 ENCOUNTER — Other Ambulatory Visit (HOSPITAL_COMMUNITY): Payer: Self-pay | Admitting: Interventional Radiology

## 2019-05-01 ENCOUNTER — Telehealth (HOSPITAL_COMMUNITY): Payer: Self-pay

## 2019-05-01 DIAGNOSIS — I671 Cerebral aneurysm, nonruptured: Secondary | ICD-10-CM

## 2019-05-01 NOTE — Telephone Encounter (Signed)
Received a referral from Dr. Harlan Stains. Called to schedule diagnostic angiogram, no answer, left vm. AW

## 2019-05-07 ENCOUNTER — Other Ambulatory Visit: Payer: Self-pay | Admitting: Radiology

## 2019-05-08 ENCOUNTER — Ambulatory Visit: Payer: BC Managed Care – PPO | Attending: Family

## 2019-05-08 DIAGNOSIS — Z23 Encounter for immunization: Secondary | ICD-10-CM | POA: Insufficient documentation

## 2019-05-08 NOTE — Progress Notes (Signed)
   Covid-19 Vaccination Clinic  Name:  Vanessa Dean    MRN: [id number] DOB: February 20, 1966  05/08/2019  [name] was observed post Covid-19 immunization for 15 minutes without incident. She was provided with Vaccine Information Sheet and instruction to access the V-Safe system.   [name] was instructed to call 911 with any severe reactions post vaccine: Marland Kitchen Difficulty breathing  . Swelling of face and throat  . A fast heartbeat  . A bad rash all over body  . Dizziness and weakness   Immunizations Administered    Name Date Dose VIS Date Route   Moderna COVID-19 Vaccine 05/08/2019  1:56 PM 0.5 mL 02/04/2019 Intramuscular   Manufacturer: Moderna   Lot: [id number]   [place]: [id number]

## 2019-05-09 ENCOUNTER — Other Ambulatory Visit (HOSPITAL_COMMUNITY): Payer: Self-pay | Admitting: Interventional Radiology

## 2019-05-09 ENCOUNTER — Ambulatory Visit (HOSPITAL_COMMUNITY)
Admission: RE | Admit: 2019-05-09 | Discharge: 2019-05-09 | Disposition: A | Discharge status: Home / Self Care | Payer: BC Managed Care – PPO | Source: Ambulatory Visit | Attending: Interventional Radiology | Admitting: Interventional Radiology

## 2019-05-09 ENCOUNTER — Other Ambulatory Visit: Payer: Self-pay

## 2019-05-09 DIAGNOSIS — R7303 Prediabetes: Secondary | ICD-10-CM | POA: Insufficient documentation

## 2019-05-09 DIAGNOSIS — K5090 Crohn's disease, unspecified, without complications: Secondary | ICD-10-CM | POA: Insufficient documentation

## 2019-05-09 DIAGNOSIS — G4730 Sleep apnea, unspecified: Secondary | ICD-10-CM | POA: Diagnosis not present

## 2019-05-09 DIAGNOSIS — Z79899 Other long term (current) drug therapy: Secondary | ICD-10-CM | POA: Insufficient documentation

## 2019-05-09 DIAGNOSIS — I671 Cerebral aneurysm, nonruptured: Secondary | ICD-10-CM

## 2019-05-09 DIAGNOSIS — I10 Essential (primary) hypertension: Secondary | ICD-10-CM | POA: Insufficient documentation

## 2019-05-09 HISTORY — PX: IR ANGIO INTRA EXTRACRAN SEL COM CAROTID INNOMINATE BILAT MOD SED: IMG5360

## 2019-05-09 HISTORY — PX: IR ANGIO VERTEBRAL SEL VERTEBRAL BILAT MOD SED: IMG5369

## 2019-05-09 HISTORY — PX: IR US GUIDE VASC ACCESS RIGHT: IMG2390

## 2019-05-09 LAB — CBC
HCT: 34.4 % — ABNORMAL LOW (ref 36.0–46.0)
Hemoglobin: 11.4 g/dL — ABNORMAL LOW (ref 12.0–15.0)
MCH: 29.4 pg (ref 26.0–34.0)
MCHC: 33.1 g/dL (ref 30.0–36.0)
MCV: 88.7 fL (ref 80.0–100.0)
Platelets: 279 10*3/uL (ref 150–400)
RBC: 3.88 MIL/uL (ref 3.87–5.11)
RDW: 12.8 % (ref 11.5–15.5)
WBC: 6.5 10*3/uL (ref 4.0–10.5)
nRBC: 0 % (ref 0.0–0.2)

## 2019-05-09 LAB — BASIC METABOLIC PANEL
Anion gap: 12 (ref 5–15)
BUN: 13 mg/dL (ref 6–20)
CO2: 27 mmol/L (ref 22–32)
Calcium: 9.2 mg/dL (ref 8.9–10.3)
Chloride: 102 mmol/L (ref 98–111)
Creatinine, Ser: 1.06 mg/dL — ABNORMAL HIGH (ref 0.44–1.00)
GFR calc Af Amer: >60 mL/min (ref >60)
GFR calc non Af Amer: >60 mL/min (ref >60)
Glucose, Bld: 121 mg/dL — ABNORMAL HIGH (ref 70–99)
Potassium: 3.3 mmol/L — ABNORMAL LOW (ref 3.5–5.1)
Sodium: 141 mmol/L (ref 135–145)

## 2019-05-09 LAB — PROTIME-INR
INR: 1 (ref 0.8–1.2)
Prothrombin Time: 12.8 seconds (ref 11.4–15.2)

## 2019-05-09 MED ORDER — LIDOCAINE HCL 1 % IJ SOLN
INTRAMUSCULAR | Status: AC
  Filled 2019-05-09: qty 20

## 2019-05-09 MED ORDER — HEPARIN SODIUM (PORCINE) 1000 UNIT/ML IJ SOLN
INTRAMUSCULAR | Status: AC | PRN
  Administered 2019-05-09: 2000 [IU] via INTRA_ARTERIAL

## 2019-05-09 MED ORDER — FENTANYL CITRATE (PF) 100 MCG/2ML IJ SOLN
INTRAMUSCULAR | Status: AC | PRN
  Administered 2019-05-09 (×2): 25 ug via INTRAVENOUS

## 2019-05-09 MED ORDER — FENTANYL CITRATE (PF) 100 MCG/2ML IJ SOLN
INTRAMUSCULAR | Status: AC
  Filled 2019-05-09: qty 2

## 2019-05-09 MED ORDER — NITROGLYCERIN 1 MG/10 ML FOR IR/CATH LAB
INTRA_ARTERIAL | Status: AC | PRN
  Administered 2019-05-09: 100 ug via INTRA_ARTERIAL
  Administered 2019-05-09 (×2): 200 ug via INTRA_ARTERIAL
  Administered 2019-05-09: 100 ug via INTRA_ARTERIAL
  Administered 2019-05-09: 200 ug via INTRA_ARTERIAL
  Administered 2019-05-09: 100 ug via INTRA_ARTERIAL

## 2019-05-09 MED ORDER — SODIUM CHLORIDE 0.9 % IV SOLN: INTRAVENOUS | Status: AC

## 2019-05-09 MED ORDER — SODIUM CHLORIDE 0.9 % IV SOLN: Freq: Once | INTRAVENOUS | Status: DC

## 2019-05-09 MED ORDER — MIDAZOLAM HCL 2 MG/2ML IJ SOLN
INTRAMUSCULAR | Status: AC | PRN
  Administered 2019-05-09 (×2): 1 mg via INTRAVENOUS

## 2019-05-09 MED ORDER — HEPARIN SODIUM (PORCINE) 1000 UNIT/ML IJ SOLN
INTRAMUSCULAR | Status: AC
  Filled 2019-05-09: qty 1

## 2019-05-09 MED ORDER — VERAPAMIL HCL 2.5 MG/ML IV SOLN
INTRAVENOUS | Status: AC | PRN
  Administered 2019-05-09: 2.5 mg via INTRA_ARTERIAL

## 2019-05-09 MED ORDER — VERAPAMIL HCL 2.5 MG/ML IV SOLN
INTRAVENOUS | Status: AC
  Filled 2019-05-09: qty 2

## 2019-05-09 MED ORDER — IOHEXOL 300 MG/ML  SOLN
150.0000 mL | Freq: Once | INTRAMUSCULAR | Status: AC | PRN
  Administered 2019-05-09: 85 mL via INTRA_ARTERIAL

## 2019-05-09 MED ORDER — MIDAZOLAM HCL 2 MG/2ML IJ SOLN
INTRAMUSCULAR | Status: AC
  Filled 2019-05-09: qty 2

## 2019-05-09 NOTE — Sedation Documentation (Signed)
12cc of air in TR band @ [phone]

## 2019-05-09 NOTE — Procedures (Signed)
S/P  4 vessel cerebral arteriogram. RT CFA approach. Findings. 1.No angio evidence of intracranial aneurysm ,AV shuntinh or AVM or of dissections. 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  RT  RAD approach abandoned due to severe spasm of rt RAD ARTERY. S.Kamaryn Grimley MD

## 2019-05-09 NOTE — Consult Note (Signed)
Chief Complaint: Dr. Deland Pretty  Referring Physician(s): Luanne Bras  Supervising Physician: Luanne Bras  Patient Status: [facility] - Out-pt  History of Present Illness: Vanessa Dean is a 54 y.o. female Found to have a  2 mm vascular protrusion arising from the cavernous left ICA while being worked up for pulsatile tinnitus. Team is requesting angiogram of bilateral common and innominates for further evaluation.  Past Medical History:  Diagnosis Date  . Anemia   . Arthritis   . Crohn disease ([place])   . Fluid retention in legs   . Hypertension   . PONV (postoperative nausea and vomiting)   . Pre-diabetes   . Seasonal allergies    TAKES ZYRTEC AS NEEDED  . Sleep apnea    cpap at night  . Snores     Past Surgical History:  Procedure Laterality Date  . ABDOMINAL HYSTERECTOMY    . APPENDECTOMY    . BREAST REDUCTION SURGERY Bilateral 02/12/2019   Procedure: MAMMARY REDUCTION  (BREAST);  Surgeon: Wallace Going, DO;  Location: [place];  Service: Plastics;  Laterality: Bilateral;  . CARPAL TUNNEL RELEASE  05/16/2011   Procedure: CARPAL TUNNEL RELEASE;  Surgeon: Cammie Sickle., MD;  Location: [place];  Service: Orthopedics;  Laterality: Left;  . CARPAL TUNNEL RELEASE  08/08/2011   Procedure: CARPAL TUNNEL RELEASE;  Surgeon: Cammie Sickle., MD;  Location: [place];  Service: Orthopedics;  Laterality: Right;  . CHOLECYSTECTOMY    . COLON SURGERY     patient had intestinal blockage  . COLONOSCOPY    . ESOPHAGOGASTRODUODENOSCOPY    . GASTRIC BYPASS    . KNEE ARTHROSCOPY  05/09/2010   right   . KNEE ARTHROSCOPY  05/10/2007   left  . LUMBAR LAMINECTOMY/DECOMPRESSION MICRODISCECTOMY N/A 10/31/2017   Procedure: LUMBAR LAMINECTOMY/DECOMPRESSION MICRODISCECTOMY ONE LEVEL  LUMBAR FOUR - LUMBAR FIVE DECOMPRESSION;  Surgeon: Melina Schools, MD;  Location: [place];  Service: Orthopedics;  Laterality: N/A;  LUMBAR  LAMINECTOMY/DECOMPRESSION MICRODISCECTOMY ONE LEVEL  LUMBAR FOUR - LUMBAR FIVE DECOMPRESSION  . TOTAL VAGINAL HYSTERECTOMY  07/20/2005   posterior colporrhaphy    Allergies: Oxycodone-acetaminophen  Medications: Prior to Admission medications   Medication Sig Start Date End Date Taking? Authorizing Provider  acetaminophen (TYLENOL) 500 MG tablet Take 500-1,000 mg by mouth every 6 (six) hours as needed for moderate pain or headache.   Yes [provider]  atorvastatin (LIPITOR) 10 MG tablet Take 10 mg by mouth every evening. 10/10/17  Yes [provider]  diphenhydrAMINE (BENADRYL) 25 MG tablet Take 25 mg by mouth daily as needed for allergies.   Yes [provider]  diphenhydramine-acetaminophen (TYLENOL PM) 25-500 MG TABS tablet Take 1-2 tablets by mouth at bedtime as needed (sleep).   Yes [provider]  Magnesium 250 MG TABS Take 250 mg by mouth daily.   Yes [provider]  Multiple Vitamins-Minerals (MULTIVITAMIN WITH MINERALS) tablet Take 1 tablet by mouth daily.   Yes [provider]  NALTREXONE HCL PO Take 4.5 mg by mouth at bedtime. Naltrexone 1.5 mg capsule   Yes [provider]  Prenatal Vit-Fe Fumarate-FA (PRENATAL PO) Take 1 tablet by mouth daily.   Yes [provider]  valsartan-hydrochlorothiazide (DIOVAN-HCT) 320-25 MG tablet Take 1 tablet by mouth daily. 07/23/17  Yes [provider]  vitamin C (ASCORBIC ACID) 250 MG tablet Take 500 mg by mouth daily.   Yes [provider]  Family History  Problem Relation Age of Onset  . Alcohol abuse Paternal Uncle   . Diabetes Paternal Uncle     Social History   Socioeconomic History  . Marital status: Married    Spouse name: Not on file  . Number of children: Not on file  . Years of education: Not on file  . Highest education level: Not on file  Occupational History  . Not on file  Tobacco Use  . Smoking status: Never Smoker  .  Smokeless tobacco: Never Used  Substance and Sexual Activity  . Alcohol use: Yes    Comment: SOCIAL  . Drug use: No  . Sexual activity: Yes  Other Topics Concern  . Not on file  Social History Narrative  . Not on file   Social Determinants of Health   Financial Resource Strain:   . Difficulty of Paying Living Expenses: Not on file  Food Insecurity:   . Worried About Charity fundraiser in the Last Year: Not on file  . Ran Out of Food in the Last Year: Not on file  Transportation Needs:   . Lack of Transportation (Medical): Not on file  . Lack of Transportation (Non-Medical): Not on file  Physical Activity:   . Days of Exercise per Week: Not on file  . Minutes of Exercise per Session: Not on file  Stress:   . Feeling of Stress : Not on file  Social Connections:   . Frequency of Communication with Friends and Family: Not on file  . Frequency of Social Gatherings with Friends and Family: Not on file  . Attends Religious Services: Not on file  . Active Member of Clubs or Organizations: Not on file  . Attends Archivist Meetings: Not on file  . Marital Status: Not on file    Review of Systems: A 12 point ROS discussed and pertinent positives are indicated in the HPI above.  All other systems are negative.  Review of Systems  Constitutional: Negative for fatigue and fever.  HENT: Negative for congestion.   Respiratory: Negative for cough and shortness of breath.   Gastrointestinal: Negative for abdominal pain, diarrhea, nausea and vomiting.  Neurological:       Pulsatile tinnitus of left ear. Better when head is moved to the right worse when standing.    Vital Signs: BP 128/86   Pulse 99   Resp 16   SpO2 98%   Physical Exam Vitals and nursing note reviewed.  Constitutional:      Appearance: She is well-developed.  HENT:     Head: Normocephalic and atraumatic.  Eyes:     Conjunctiva/sclera: Conjunctivae normal.  Cardiovascular:     Rate and Rhythm:  Normal rate and regular rhythm.  Pulmonary:     Effort: Pulmonary effort is normal.     Breath sounds: Normal breath sounds.  Musculoskeletal:     Cervical back: Normal range of motion.  Neurological:     General: No focal deficit present.     Mental Status: She is alert and oriented to person, place, and time.     Imaging: MR ANGIO HEAD WO CONTRAST  Result Date: 04/24/2019 CLINICAL DATA:  Pulsatile tinnitus, left ear. Rule out stenosis of artery. Additional history provided by technologist: Left-sided pulsatile tinnitus for 1 month, patient reports no trauma or prior surgery. EXAM: MRA HEAD WITHOUT CONTRAST TECHNIQUE: Angiographic images of the Circle of Willis were obtained using MRA technique without intravenous contrast. COMPARISON:  No pertinent prior  studies available for comparison. FINDINGS: The intracranial internal carotid arteries are patent without significant stenosis. The M1 middle cerebral arteries are patent without significant stenosis. No M2 proximal branch occlusion or high-grade proximal stenosis is identified. The anterior cerebral arteries are patent bilaterally without high-grade proximal stenosis. There is a 2 mm inferiorly projecting vascular protrusion arising from the cavernous left ICA which may reflect a small aneurysm (series 8, image 1). The intracranial vertebral arteries are patent without significant proximal stenosis, as is the basilar artery. The bilateral posterior cerebral arteries are patent without significant proximal stenosis. A sizable right posterior communicating artery is present. IMPRESSION: No intracranial large vessel occlusion or proximal high-grade arterial stenosis. 2 mm vascular protrusion arising from the cavernous left ICA, which may reflect a small aneurysm. Electronically Signed   By: Kellie Simmering DO   On: 04/24/2019 09:17    Labs:  CBC: No results for input(s): WBC, HGB, HCT, PLT in the last 8760 hours.  COAGS: No results for input(s):  INR, APTT in the last 8760 hours.  BMP: Recent Labs    02/06/19 1330  NA 139  K 3.9  CL 103  CO2 26  GLUCOSE 89  BUN 9  CALCIUM 9.5  CREATININE 0.80  GFRNONAA >60  GFRAA >60    LIVER FUNCTION TESTS: No results for input(s): BILITOT, AST, ALT, ALKPHOS, PROT, ALBUMIN in the last 8760 hours.  TUMOR MARKERS: No results for input(s): AFPTM, CEA, CA199, CHROMGRNA in the last 8760 hours.  Assessment and Plan: 54 y.o, female outpatient.   Pertinent Imaging 2.17.21 - MR Angio head reads No intracranial large vessel occlusion or proximal high-grade arterial stenosis.  2 mm vascular protrusion arising from the cavernous left ICA, which may reflect a small aneurysm.   Pertinent IR History none  Pertinent Allergies none  All labs and medications are within acceptable parameters. Patient is afebrile.  Risks and benefits of angiogram of bilateral carotids and innominates were discussed with the patient including, but not limited to bleeding, infection, vascular injury or contrast induced renal failure.  This interventional procedure involves the use of X-rays and because of the nature of the planned procedure, it is possible that we will have prolonged use of X-ray fluoroscopy.  Potential radiation risks to you include (but are not limited to) the following: - A slightly elevated risk for cancer  several years later in life. This risk is typically less than 0.5% percent. This risk is low in comparison to the normal incidence of human cancer, which is 33% for women and 50% for men according to the [place] [place]. - Radiation induced injury can include skin redness, resembling a rash, tissue breakdown / ulcers and hair loss (which can be temporary or permanent).   The likelihood of either of these occurring depends on the difficulty of the procedure and whether you are sensitive to radiation due to previous procedures, disease, or genetic conditions.   IF your  procedure requires a prolonged use of radiation, you will be notified and given written instructions for further action.  It is your responsibility to monitor the irradiated area for the 2 weeks following the procedure and to notify your physician if you are concerned that you have suffered a radiation induced injury.    All of the patient's questions were answered, patient is agreeable to proceed.  Consent signed and in chart.    Thank you for this interesting consult.  I greatly enjoyed meeting Vanessa Dean and look forward to  participating in their care.  A copy of this report was sent to the requesting provider on this date.  Electronically Signed: Avel Peace, NP 05/09/2019, 8:02 AM   I spent a total of  40 Minutes   in face to face in clinical consultation, greater than 50% of which was counseling/coordinating care for Angiogram of bilateral carotids and innominates

## 2019-05-09 NOTE — Discharge Instructions (Signed)
Femoral Site Care This sheet gives you information about how to care for yourself after your procedure. Your health care provider may also give you more specific instructions. If you have problems or questions, contact your health care provider. What can I expect after the procedure? After the procedure, it is common to have:  Bruising that usually fades within 1-2 weeks.  Tenderness at the site. Follow these instructions at home: Wound care  Follow instructions from your health care provider about how to take care of your insertion site. Make sure you: ? Wash your hands with soap and water before you change your bandage (dressing). If soap and water are not available, use hand sanitizer. ? Change your dressing as told by your health care provider. ? .  Do not take baths, swim, or use a hot tub until your health care provider approves.  You may shower 24-48 hours after the procedure or as told by your health care provider. ? Gently wash the site with plain soap and water. ? Pat the area dry with a clean towel. ? Do not rub the site. This may cause bleeding.  Do not apply powder or lotion to the site. Keep the site clean and dry.  Check your femoral site every day for signs of infection. Check for: ? Redness, swelling, or pain. ? Fluid or blood. ? Warmth. ? Pus or a bad smell. Activity  For the first 2-3 days after your procedure, or as long as directed: ? Avoid climbing stairs as much as possible. ? Do not squat.  Do not lift anything that is heavier than 10 lb (4.5 kg), or the limit that you are told, until your health care provider says that it is safe.  Rest as directed. ? Avoid sitting for a long time without moving. Get up to take short walks every 1-2 hours.  Do not drive for 24 hours if you were given a medicine to help you relax (sedative). General instructions  Take over-the-counter and prescription medicines only as told by your health care provider.  Keep all  follow-up visits as told by your health care provider. This is important. Contact a health care provider if you have:  A fever or chills.  You have redness, swelling, or pain around your insertion site. Get help right away if:  The catheter insertion area swells very fast.  You pass out.  You suddenly start to sweat or your skin gets clammy.  The catheter insertion area is bleeding, and the bleeding does not stop when you hold steady pressure on the area.  The area near or just beyond the catheter insertion site becomes pale, cool, tingly, or numb. These symptoms may represent a serious problem that is an emergency. Do not wait to see if the symptoms will go away. Get medical help right away. Call your local emergency services (911 in the [place] Do not drive yourself to the hospital. Summary  After the procedure, it is common to have bruising that usually fades within 1-2 weeks.  Check your femoral site every day for signs of infection.  Do not lift anything that is heavier than 10 lb (4.5 kg), or the limit that you are told, until your health care provider says that it is safe. This information is not intended to replace advice given to you by your health care provider. Make sure you discuss any questions you have with your health care provider. Document Revised: 03/05/2017 Document Reviewed: 03/05/2017 Elsevier Patient Education  [phone]  [employer].

## 2019-05-09 NOTE — Sedation Documentation (Signed)
ETCO2/nasal canula removed per Dr. Estanislado Pandy request. Patient 100% on room air.

## 2019-05-09 NOTE — Sedation Documentation (Signed)
Right groin sheath removed, 5Fr exoseal closure device used.

## 2019-05-16 ENCOUNTER — Encounter (HOSPITAL_COMMUNITY): Payer: Self-pay

## 2019-06-03 ENCOUNTER — Ambulatory Visit
Admission: RE | Admit: 2019-06-03 | Discharge: 2019-06-03 | Disposition: A | Discharge status: Home / Self Care | Payer: BC Managed Care – PPO | Source: Ambulatory Visit | Attending: Family Medicine | Admitting: Family Medicine

## 2019-06-03 ENCOUNTER — Other Ambulatory Visit: Payer: Self-pay

## 2019-06-03 DIAGNOSIS — H93A2 Pulsatile tinnitus, left ear: Secondary | ICD-10-CM

## 2019-06-03 MED ORDER — GADOBENATE DIMEGLUMINE 529 MG/ML IV SOLN
20.0000 mL | Freq: Once | INTRAVENOUS | Status: AC | PRN
  Administered 2019-06-03: 20 mL via INTRAVENOUS

## 2019-06-10 ENCOUNTER — Ambulatory Visit: Payer: BC Managed Care – PPO | Attending: Family

## 2019-06-10 DIAGNOSIS — Z23 Encounter for immunization: Secondary | ICD-10-CM

## 2019-06-10 NOTE — Progress Notes (Signed)
   Covid-19 Vaccination Clinic  Name:  Vanessa Dean    MRN: [id number] DOB: [birth year]  06/10/2019  Vanessa Dean was observed post Covid-19 immunization for 15 minutes without incident. She was provided with Vaccine Information Sheet and instruction to access the V-Safe system.   Vanessa Dean was instructed to call 911 with any severe reactions post vaccine: Marland Kitchen Difficulty breathing  . Swelling of face and throat  . A fast heartbeat  . A bad rash all over body  . Dizziness and weakness   Immunizations Administered    Name Date Dose VIS Date Route   Moderna COVID-19 Vaccine 06/10/2019  9:44 AM 0.5 mL 02/04/2019 Intramuscular   Manufacturer: Moderna   Lot: [id number]   [place]: [id number]

## 2019-06-11 ENCOUNTER — Other Ambulatory Visit (HOSPITAL_COMMUNITY): Payer: Self-pay | Admitting: Interventional Radiology

## 2019-06-16 ENCOUNTER — Other Ambulatory Visit (HOSPITAL_COMMUNITY): Payer: Self-pay | Admitting: Interventional Radiology

## 2019-06-16 DIAGNOSIS — I771 Stricture of artery: Secondary | ICD-10-CM

## 2019-06-16 DIAGNOSIS — H93A2 Pulsatile tinnitus, left ear: Secondary | ICD-10-CM

## 2019-06-24 ENCOUNTER — Ambulatory Visit (INDEPENDENT_AMBULATORY_CARE_PROVIDER_SITE_OTHER): Payer: BC Managed Care – PPO | Admitting: Plastic Surgery

## 2019-06-24 ENCOUNTER — Encounter: Payer: Self-pay | Admitting: Plastic Surgery

## 2019-06-24 ENCOUNTER — Other Ambulatory Visit: Payer: Self-pay

## 2019-06-24 VITALS — BP 133/83 | HR 98 | Temp 97.5°F | Ht 65.0 in | Wt 278.0 lb

## 2019-06-24 DIAGNOSIS — Z9889 Other specified postprocedural states: Secondary | ICD-10-CM

## 2019-06-24 DIAGNOSIS — Z719 Counseling, unspecified: Secondary | ICD-10-CM

## 2019-06-24 DIAGNOSIS — N62 Hypertrophy of breast: Secondary | ICD-10-CM

## 2019-06-24 NOTE — Progress Notes (Signed)
   Subjective:    Patient ID: Vanessa Dean, female    DOB: [birth year], 54 y.o.   MRN: [id number]  The patient is a 54 year old black female here for follow-up on her breast reduction.  She is extremely happy with her overall results.  Her incisions are healing very nicely.  She did ask about a little bit of firm scarring on the left nipple areola and a little bit of lateralization of the areola on the left.  It is possible to modify this.      Review of Systems  Constitutional: Negative.   HENT: Negative.   Eyes: Negative.   Respiratory: Negative.   Cardiovascular: Negative.   Gastrointestinal: Negative.   Genitourinary: Negative.        Objective:   Physical Exam Vitals and nursing note reviewed.  Constitutional:      Appearance: Normal appearance.  HENT:     Head: Normocephalic and atraumatic.  Cardiovascular:     Rate and Rhythm: Normal rate.  Pulmonary:     Effort: Pulmonary effort is normal.  Abdominal:     General: Abdomen is flat.  Neurological:     General: No focal deficit present.     Mental Status: She is alert.  Psychiatric:        Mood and Affect: Mood normal.        Behavior: Behavior normal.        Assessment & Plan:     ICD-10-CM   1. S/P bilateral breast reduction  Z98.890   2. Symptomatic mammary hypertrophy  N62     The patient may be interested in a revision to the left nipple areola.  I think it would be possible.  I would like her to give it a little bit of time to see if there is some improvement with massage.  I also will order the skinUva for her I think this will help as well. Pictures were obtained of the patient and placed in the chart with the patient's or guardian's permission.

## 2019-06-29 ENCOUNTER — Emergency Department (HOSPITAL_COMMUNITY)
Admission: EM | Admit: 2019-06-29 | Discharge: 2019-06-30 | Disposition: A | Discharge status: Home / Self Care | Payer: BC Managed Care – PPO | Attending: Emergency Medicine | Admitting: Emergency Medicine

## 2019-06-29 ENCOUNTER — Other Ambulatory Visit: Payer: Self-pay

## 2019-06-29 DIAGNOSIS — Z79899 Other long term (current) drug therapy: Secondary | ICD-10-CM | POA: Diagnosis not present

## 2019-06-29 DIAGNOSIS — K5090 Crohn's disease, unspecified, without complications: Secondary | ICD-10-CM | POA: Insufficient documentation

## 2019-06-29 DIAGNOSIS — I10 Essential (primary) hypertension: Secondary | ICD-10-CM | POA: Diagnosis not present

## 2019-06-29 DIAGNOSIS — M79661 Pain in right lower leg: Secondary | ICD-10-CM | POA: Insufficient documentation

## 2019-06-29 DIAGNOSIS — R0789 Other chest pain: Secondary | ICD-10-CM | POA: Diagnosis not present

## 2019-06-29 DIAGNOSIS — M25561 Pain in right knee: Secondary | ICD-10-CM | POA: Diagnosis present

## 2019-06-29 NOTE — ED Provider Notes (Signed)
TIME SEEN: 11:47 PM  CHIEF COMPLAINT: Right posterior knee pain and calf pain, chest pain  HPI: Patient is a 54 year old female with history of hypertension, prediabetes, Crohn's disease who presents to the emergency department with complaints of right posterior knee pain radiating into the calf and intermittent sharp left-sided chest pain.  No shortness of breath.  No fever or productive cough.  No nausea, vomiting or diarrhea.  No injury to the knee.  Has been able to ambulate.  No known history of PE or DVT.  No aggravating or alleviating factors.  Chest pain is not exertional or pleuritic.   ROS: See HPI Constitutional: no fever  Eyes: no drainage  ENT: no runny nose   Cardiovascular:  chest pain  Resp: no SOB  GI: no vomiting GU: no dysuria Integumentary: no rash  Allergy: no hives  Musculoskeletal: no leg swelling; + right posterior knee and calf pain Neurological: no slurred speech ROS otherwise negative  PAST MEDICAL HISTORY/PAST SURGICAL HISTORY:  Past Medical History:  Diagnosis Date  . Anemia   . Arthritis   . Crohn disease ([place])   . Fluid retention in legs   . Hypertension   . PONV (postoperative nausea and vomiting)   . Pre-diabetes   . Seasonal allergies    TAKES ZYRTEC AS NEEDED  . Sleep apnea    cpap at night  . Snores     MEDICATIONS:  Prior to Admission medications   Medication Sig Start Date End Date Taking? Authorizing Provider  acetaminophen (TYLENOL) 500 MG tablet Take 500-1,000 mg by mouth every 6 (six) hours as needed for moderate pain or headache.    [provider]  atorvastatin (LIPITOR) 10 MG tablet Take 10 mg by mouth every evening. 10/10/17   [provider]  diphenhydrAMINE (BENADRYL) 25 MG tablet Take 25 mg by mouth daily as needed for allergies.    [provider]  diphenhydramine-acetaminophen (TYLENOL PM) 25-500 MG TABS tablet Take 1-2 tablets by mouth at bedtime as needed (sleep).    [provider]   Magnesium 250 MG TABS Take 250 mg by mouth daily.    [provider]  Multiple Vitamins-Minerals (MULTIVITAMIN WITH MINERALS) tablet Take 1 tablet by mouth daily.    [provider]  NALTREXONE HCL PO Take 4.5 mg by mouth at bedtime. Naltrexone 1.5 mg capsule    [provider]  Prenatal Vit-Fe Fumarate-FA (PRENATAL PO) Take 1 tablet by mouth daily.    [provider]  valsartan-hydrochlorothiazide (DIOVAN-HCT) 320-25 MG tablet Take 1 tablet by mouth daily. 07/23/17   [provider]  vitamin C (ASCORBIC ACID) 250 MG tablet Take 500 mg by mouth daily.    [provider]    ALLERGIES:  Allergies  Allergen Reactions  . Oxycodone-Acetaminophen Itching and Rash    SOCIAL HISTORY:  Social History   Tobacco Use  . Smoking status: Never Smoker  . Smokeless tobacco: Never Used  Substance Use Topics  . Alcohol use: Yes    Comment: SOCIAL    FAMILY HISTORY: Family History  Problem Relation Age of Onset  . Alcohol abuse Paternal Uncle   . Diabetes Paternal Uncle     EXAM: BP 127/83 (BP Location: Left Arm)   Pulse 70   Temp 98 F (36.7 C) (Oral)   Resp 20   SpO2 97%  CONSTITUTIONAL: Alert and oriented and responds appropriately to questions. Well-appearing; well-nourished HEAD: Normocephalic EYES: Conjunctivae clear, pupils appear equal, EOM appear intact ENT: normal  nose; moist mucous membranes NECK: Supple, normal ROM CARD: RRR; S1 and S2 appreciated; no murmurs, no clicks, no rubs, no gallops RESP: Normal chest excursion without splinting or tachypnea; breath sounds clear and equal bilaterally; no wheezes, no rhonchi, no rales, no hypoxia or respiratory distress, speaking full sentences ABD/GI: Normal bowel sounds; non-distended; soft, non-tender, no rebound, no guarding, no peritoneal signs, no hepatosplenomegaly BACK:  The back appears normal EXT: Normal ROM in all joints; no deformity noted, no edema; no cyanosis, tender  to behind the right posterior knee without sign of Baker's cyst, no joint effusion, no redness or warmth, does have some right calf tenderness on exam but no asymmetric swelling noted, compartments soft, 2+ right DP pulse SKIN: Normal color for age and race; warm; no rash on exposed skin NEURO: Moves all extremities equally PSYCH: The patient's mood and manner are appropriate.   MEDICAL DECISION MAKING: Patient here with complaints of posterior right knee pain, calf pain and chest pain.  No injury to the knee.  No sign of gout, septic arthritis, compartment syndrome.  Will obtain x-ray of the knee but doubt fracture given no injury.  I am concerned for possible PE, DVT.  Will obtain labs, D-dimer.  Symptoms seem atypical for ACS but will obtain troponin.  EKG reviewed/interpreted and shows no ischemic abnormality.  Will give medicine for pain.  Her husband drove her here to the emergency department.  Labs, chest x-ray pending.  Doubt dissection.  Doubt infectious etiology.  ED PROGRESS: Patient's labs, imaging reviewed/interpreted.  She has a normal high sensitive troponin of 3.  Given ongoing pain for several days, I do not feel she needs a second troponin.  Her D-dimer is also negative.  Her chest x-ray is clear and without acute abnormality.  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  Will place an Ace wrap.  She is declined crutches stating she is able to ambulate.  Given D-dimer has not been validated to rule out DVT, will obtain venous Doppler of the lower extremity as an outpatient.  I do not feel she needs anticoagulation at this time.  Recommended outpatient PCP follow-up.  Will discharge with short course of pain medication recommended rest, elevation and ice.  She has an orthopedist, Dr. Tonita Cong who she can see as an outpatient.  She reports she has had history of bilateral knee arthroscopic surgery, none recently.  At this time, I do  not feel there is any life-threatening condition present. I have reviewed, interpreted and discussed all results (EKG, imaging, lab, urine as appropriate) and exam findings with patient/family. I have reviewed nursing notes and appropriate previous records.  I feel the patient is safe to be discharged home without further emergent workup and can continue workup as an outpatient as needed. Discussed usual and customary return precautions. Patient/family verbalize understanding and are comfortable with this plan.  Outpatient follow-up has been provided as needed. All questions have been answered.    EKG Interpretation  Date/Time:  Monday June 30 2019 00:02:19 EDT Ventricular Rate:  67 PR Interval:  166 QRS Duration: 80 QT Interval:  430 QTC Calculation: 454 R Axis:   2 Text Interpretation: Normal sinus rhythm Low voltage QRS Cannot rule out Anterior infarct , age undetermined Abnormal ECG No significant change since last tracing Confirmed by Pryor Curia [phone]) on 06/30/2019 12:10:39 AM         [name] was evaluated in Emergency Department on  06/29/2019 for the symptoms described in the history of present illness. She was evaluated in the context of the global COVID-19 pandemic, which necessitated consideration that the patient might be at risk for infection with the SARS-CoV-2 virus that causes COVID-19. Institutional protocols and algorithms that pertain to the evaluation of patients at risk for COVID-19 are in a state of rapid change based on information released by regulatory bodies including the CDC and federal and state organizations. These policies and algorithms were followed during the patient's care in the ED.      Batu Cassin, Delice Bison, DO 06/30/19 [phone]

## 2019-06-29 NOTE — ED Triage Notes (Signed)
Pt c/o right, posterior, knee pain. Denies injury. (+2) Pedal pulse, no warmth or redness noted.

## 2019-06-30 ENCOUNTER — Ambulatory Visit (HOSPITAL_BASED_OUTPATIENT_CLINIC_OR_DEPARTMENT_OTHER)
Admission: RE | Admit: 2019-06-30 | Discharge: 2019-06-30 | Disposition: A | Discharge status: Home / Self Care | Payer: BC Managed Care – PPO | Source: Ambulatory Visit | Attending: Emergency Medicine | Admitting: Emergency Medicine

## 2019-06-30 ENCOUNTER — Emergency Department (HOSPITAL_COMMUNITY): Payer: BC Managed Care – PPO

## 2019-06-30 DIAGNOSIS — M7989 Other specified soft tissue disorders: Secondary | ICD-10-CM | POA: Diagnosis not present

## 2019-06-30 DIAGNOSIS — Z719 Counseling, unspecified: Secondary | ICD-10-CM

## 2019-06-30 DIAGNOSIS — M79609 Pain in unspecified limb: Secondary | ICD-10-CM

## 2019-06-30 LAB — CBC WITH DIFFERENTIAL/PLATELET
Abs Immature Granulocytes: 0.03 10*3/uL (ref 0.00–0.07)
Basophils Absolute: 0.1 10*3/uL (ref 0.0–0.1)
Basophils Relative: 1 %
Eosinophils Absolute: 0.2 10*3/uL (ref 0.0–0.5)
Eosinophils Relative: 2 %
HCT: 37.3 % (ref 36.0–46.0)
Hemoglobin: 12.2 g/dL (ref 12.0–15.0)
Immature Granulocytes: 0 %
Lymphocytes Relative: 32 %
Lymphs Abs: 2.8 10*3/uL (ref 0.7–4.0)
MCH: 29.4 pg (ref 26.0–34.0)
MCHC: 32.7 g/dL (ref 30.0–36.0)
MCV: 89.9 fL (ref 80.0–100.0)
Monocytes Absolute: 0.9 10*3/uL (ref 0.1–1.0)
Monocytes Relative: 10 %
Neutro Abs: 4.8 10*3/uL (ref 1.7–7.7)
Neutrophils Relative %: 55 %
Platelets: 302 10*3/uL (ref 150–400)
RBC: 4.15 MIL/uL (ref 3.87–5.11)
RDW: 13.5 % (ref 11.5–15.5)
WBC: 8.7 10*3/uL (ref 4.0–10.5)
nRBC: 0 % (ref 0.0–0.2)

## 2019-06-30 LAB — BASIC METABOLIC PANEL
Anion gap: 13 (ref 5–15)
BUN: 16 mg/dL (ref 6–20)
CO2: 26 mmol/L (ref 22–32)
Calcium: 9 mg/dL (ref 8.9–10.3)
Chloride: 101 mmol/L (ref 98–111)
Creatinine, Ser: 1.06 mg/dL — ABNORMAL HIGH (ref 0.44–1.00)
GFR calc Af Amer: >60 mL/min (ref >60)
GFR calc non Af Amer: >60 mL/min (ref >60)
Glucose, Bld: 98 mg/dL (ref 70–99)
Potassium: 3.9 mmol/L (ref 3.5–5.1)
Sodium: 140 mmol/L (ref 135–145)

## 2019-06-30 LAB — TROPONIN I (HIGH SENSITIVITY): Troponin I (High Sensitivity): 3 ng/L (ref <18)

## 2019-06-30 LAB — D-DIMER, QUANTITATIVE: D-Dimer, Quant: 0.49 ug/mL-FEU (ref 0.00–0.50)

## 2019-06-30 MED ORDER — HYDROCODONE-ACETAMINOPHEN 5-325 MG PO TABS: 2.0000 | ORAL_TABLET | Freq: Four times a day (QID) | ORAL | 0 refills | Status: DC | PRN

## 2019-06-30 MED ORDER — IBUPROFEN 800 MG PO TABS: 800.0000 mg | ORAL_TABLET | Freq: Three times a day (TID) | ORAL | 0 refills | Status: DC | PRN

## 2019-06-30 MED ORDER — FENTANYL CITRATE (PF) 100 MCG/2ML IJ SOLN
50.0000 ug | Freq: Once | INTRAMUSCULAR | Status: AC
  Administered 2019-06-30: 50 ug via INTRAVENOUS
  Filled 2019-06-30: qty 2

## 2019-06-30 MED ORDER — HYDROCODONE-ACETAMINOPHEN 5-325 MG PO TABS
2.0000 | ORAL_TABLET | Freq: Once | ORAL | Status: AC
  Administered 2019-06-30: 2 via ORAL
  Filled 2019-06-30: qty 2

## 2019-06-30 MED ORDER — ONDANSETRON HCL 4 MG/2ML IJ SOLN
4.0000 mg | Freq: Once | INTRAMUSCULAR | Status: AC
  Administered 2019-06-30: 4 mg via INTRAVENOUS
  Filled 2019-06-30: qty 2

## 2019-06-30 MED ORDER — ONDANSETRON HCL 4 MG PO TABS: 4.0000 mg | ORAL_TABLET | Freq: Four times a day (QID) | ORAL | 0 refills | Status: AC

## 2019-06-30 NOTE — Discharge Instructions (Signed)
You are being provided a prescription for opiates (also known as narcotics) for pain control.  Opiates can be addictive and should only be used when absolutely necessary for pain control when other alternatives do not work.  We recommend you only use them for the recommended amount of time and only as prescribed.  Please do not take with other sedative medications or alcohol.  Please do not drive, operate machinery, make important decisions while taking opiates.  Please note that these medications can be addictive and have high abuse potential.  Patients can become addicted to narcotics after only taking them for a few days.  Please keep these medications locked away from children, teenagers or any family members with history of substance abuse.  Narcotic pain medicine may also make you constipated.  You may use over-the-counter medications such as MiraLAX, Colace to prevent constipation.  If you become constipated you may use over-the-counter enemas as needed.  Itching and nausea are common side effects of narcotic pain medication.  If you develop uncontrolled vomiting or a rash, please stop these medications.

## 2019-06-30 NOTE — Progress Notes (Signed)
Lower extremity venous has been completed.   Preliminary results in CV Proc.   Abram Sander 06/30/2019 11:53 AM

## 2019-06-30 NOTE — ED Notes (Signed)
Patient verbalizes understanding of discharge instructions. Opportunity for questioning and answers were provided. Armband removed by staff, pt discharged from ED in wheelchair to home.   

## 2019-06-30 NOTE — ED Notes (Signed)
Pt transported to XRAY °

## 2019-07-04 ENCOUNTER — Other Ambulatory Visit: Payer: Self-pay

## 2019-07-04 ENCOUNTER — Ambulatory Visit (HOSPITAL_COMMUNITY)
Admission: RE | Admit: 2019-07-04 | Discharge: 2019-07-04 | Disposition: A | Discharge status: Home / Self Care | Payer: BC Managed Care – PPO | Source: Ambulatory Visit | Attending: Interventional Radiology | Admitting: Interventional Radiology

## 2019-07-04 DIAGNOSIS — I771 Stricture of artery: Secondary | ICD-10-CM | POA: Diagnosis present

## 2019-07-04 DIAGNOSIS — H93A2 Pulsatile tinnitus, left ear: Secondary | ICD-10-CM | POA: Insufficient documentation

## 2019-07-07 ENCOUNTER — Telehealth (HOSPITAL_COMMUNITY): Payer: Self-pay

## 2019-07-07 NOTE — Telephone Encounter (Signed)
Pt agreed to f/u in 6 months with [facility]. AW

## 2019-07-28 ENCOUNTER — Encounter: Payer: Self-pay | Admitting: Neurology

## 2019-09-04 ENCOUNTER — Encounter (INDEPENDENT_AMBULATORY_CARE_PROVIDER_SITE_OTHER): Payer: Self-pay

## 2019-09-09 ENCOUNTER — Encounter (INDEPENDENT_AMBULATORY_CARE_PROVIDER_SITE_OTHER): Payer: Self-pay | Admitting: Family Medicine

## 2019-09-09 ENCOUNTER — Other Ambulatory Visit: Payer: Self-pay

## 2019-09-09 ENCOUNTER — Ambulatory Visit (INDEPENDENT_AMBULATORY_CARE_PROVIDER_SITE_OTHER): Payer: BC Managed Care – PPO | Admitting: Family Medicine

## 2019-09-09 VITALS — BP 112/79 | HR 72 | Temp 98.1°F | Ht 65.0 in | Wt 274.0 lb

## 2019-09-09 DIAGNOSIS — I152 Hypertension secondary to endocrine disorders: Secondary | ICD-10-CM

## 2019-09-09 DIAGNOSIS — F3289 Other specified depressive episodes: Secondary | ICD-10-CM

## 2019-09-09 DIAGNOSIS — E1159 Type 2 diabetes mellitus with other circulatory complications: Secondary | ICD-10-CM

## 2019-09-09 DIAGNOSIS — R5383 Other fatigue: Secondary | ICD-10-CM

## 2019-09-09 DIAGNOSIS — Z9189 Other specified personal risk factors, not elsewhere classified: Secondary | ICD-10-CM

## 2019-09-09 DIAGNOSIS — E1169 Type 2 diabetes mellitus with other specified complication: Secondary | ICD-10-CM | POA: Diagnosis not present

## 2019-09-09 DIAGNOSIS — E559 Vitamin D deficiency, unspecified: Secondary | ICD-10-CM

## 2019-09-09 DIAGNOSIS — Z0289 Encounter for other administrative examinations: Secondary | ICD-10-CM

## 2019-09-09 DIAGNOSIS — E119 Type 2 diabetes mellitus without complications: Secondary | ICD-10-CM

## 2019-09-09 DIAGNOSIS — E785 Hyperlipidemia, unspecified: Secondary | ICD-10-CM

## 2019-09-09 DIAGNOSIS — E538 Deficiency of other specified B group vitamins: Secondary | ICD-10-CM

## 2019-09-09 DIAGNOSIS — R0602 Shortness of breath: Secondary | ICD-10-CM

## 2019-09-09 DIAGNOSIS — Z6841 Body Mass Index (BMI) 40.0 and over, adult: Secondary | ICD-10-CM

## 2019-09-09 DIAGNOSIS — I10 Essential (primary) hypertension: Secondary | ICD-10-CM

## 2019-09-09 DIAGNOSIS — G4733 Obstructive sleep apnea (adult) (pediatric): Secondary | ICD-10-CM

## 2019-09-10 LAB — CBC WITH DIFFERENTIAL/PLATELET
Basophils Absolute: 0.1 10*3/uL (ref 0.0–0.2)
Basos: 1 %
EOS (ABSOLUTE): 0.1 10*3/uL (ref 0.0–0.4)
Eos: 1 %
Hematocrit: 37 % (ref 34.0–46.6)
Hemoglobin: 12.2 g/dL (ref 11.1–15.9)
Immature Grans (Abs): 0 10*3/uL (ref 0.0–0.1)
Immature Granulocytes: 0 %
Lymphocytes Absolute: 1.7 10*3/uL (ref 0.7–3.1)
Lymphs: 25 %
MCH: 29.8 pg (ref 26.6–33.0)
MCHC: 33 g/dL (ref 31.5–35.7)
MCV: 91 fL (ref 79–97)
Monocytes Absolute: 0.6 10*3/uL (ref 0.1–0.9)
Monocytes: 8 %
Neutrophils Absolute: 4.4 10*3/uL (ref 1.4–7.0)
Neutrophils: 65 %
Platelets: 311 10*3/uL (ref 150–450)
RBC: 4.09 x10E6/uL (ref 3.77–5.28)
RDW: 13.9 % (ref 11.7–15.4)
WBC: 6.8 10*3/uL (ref 3.4–10.8)

## 2019-09-10 LAB — LIPID PANEL WITH LDL/HDL RATIO
Cholesterol, Total: 194 mg/dL (ref 100–199)
HDL: 52 mg/dL (ref >39)
LDL Chol Calc (NIH): 116 mg/dL — ABNORMAL HIGH (ref 0–99)
LDL/HDL Ratio: 2.2 ratio (ref 0.0–3.2)
Triglycerides: 146 mg/dL (ref 0–149)
VLDL Cholesterol Cal: 26 mg/dL (ref 5–40)

## 2019-09-10 LAB — COMPREHENSIVE METABOLIC PANEL
ALT: 18 IU/L (ref 0–32)
AST: 18 IU/L (ref 0–40)
Albumin/Globulin Ratio: 1.2 (ref 1.2–2.2)
Albumin: 4 g/dL (ref 3.8–4.9)
Alkaline Phosphatase: 143 IU/L — ABNORMAL HIGH (ref 48–121)
BUN/Creatinine Ratio: 15 (ref 9–23)
BUN: 14 mg/dL (ref 6–24)
Bilirubin Total: 0.5 mg/dL (ref 0.0–1.2)
CO2: 27 mmol/L (ref 20–29)
Calcium: 9.6 mg/dL (ref 8.7–10.2)
Chloride: 103 mmol/L (ref 96–106)
Creatinine, Ser: 0.94 mg/dL (ref 0.57–1.00)
GFR calc Af Amer: 81 mL/min/{1.73_m2} (ref >59)
GFR calc non Af Amer: 70 mL/min/{1.73_m2} (ref >59)
Globulin, Total: 3.3 g/dL (ref 1.5–4.5)
Glucose: 108 mg/dL — ABNORMAL HIGH (ref 65–99)
Potassium: 3.8 mmol/L (ref 3.5–5.2)
Sodium: 141 mmol/L (ref 134–144)
Total Protein: 7.3 g/dL (ref 6.0–8.5)

## 2019-09-10 LAB — T3: T3, Total: 105 ng/dL (ref 71–180)

## 2019-09-10 LAB — HEMOGLOBIN A1C
Est. average glucose Bld gHb Est-mCnc: 140 mg/dL
Hgb A1c MFr Bld: 6.5 % — ABNORMAL HIGH (ref 4.8–5.6)

## 2019-09-10 LAB — T4, FREE: Free T4: 0.99 ng/dL (ref 0.82–1.77)

## 2019-09-10 LAB — FOLATE: Folate: 18.6 ng/mL (ref >3.0)

## 2019-09-10 LAB — VITAMIN B12: Vitamin B-12: 862 pg/mL (ref 232–1245)

## 2019-09-10 LAB — INSULIN, RANDOM: INSULIN: 15.4 u[IU]/mL (ref 2.6–24.9)

## 2019-09-10 LAB — VITAMIN D 25 HYDROXY (VIT D DEFICIENCY, FRACTURES): Vit D, 25-Hydroxy: 30.9 ng/mL (ref 30.0–100.0)

## 2019-09-10 LAB — TSH: TSH: 0.688 u[IU]/mL (ref 0.450–4.500)

## 2019-09-10 NOTE — Progress Notes (Signed)
Dear Dr. Marla Roe,   Thank you for referring Vanessa Dean to our clinic. The following note includes my evaluation and treatment recommendations.  Chief Complaint:   OBESITY [name] (MR# [id number]) is a 54 y.o. female who presents for evaluation and treatment of obesity and related comorbidities. Current BMI is Body mass index is 45.6 kg/m. [name] has been struggling with her weight for many years and has been unsuccessful in either losing weight, maintaining weight loss, or reaching her healthy weight goal.  Vanessa Dean is currently in the action stage of change and ready to dedicate time achieving and maintaining a healthier weight. Vanessa Dean is interested in becoming our patient and working on intensive lifestyle modifications including (but not limited to) diet and exercise for weight loss.  Vanessa Dean lives with her husband, Al, and their daughter [name] (61 years old) who she is also trying to get healthier.  Dr. Marla Roe referred her here and [name] with [facility] is her PCP.  She wants to try to get healthier for herself and her family.  She tends to skip meals and drink very little water.  [name]'s habits were reviewed today and are as follows: Her family eats meals together, she thinks her family will eat healthier with her, she struggles with family and or coworkers weight loss sabotage, her desired weight loss is 95 pounds, she has been heavy most of her life, she started gaining weight around college, her heaviest weight ever was 310 pounds, she craves chips and sweets, she snacks frequently in the evenings, she skips breakfast and lunch frequently, she is frequently drinking liquids with calories, she frequently makes poor food choices and she struggles with emotional eating.  Depression Screen Vanessa Dean's Food and Mood (modified PHQ-9) score was 8.  Depression screen PHQ 2/9 09/09/2019  Decreased Interest 1  Down, Depressed, Hopeless 1  PHQ - 2 Score 2  Altered sleeping 1    Tired, decreased energy 2  Change in appetite 2  Feeling bad or failure about yourself  0  Trouble concentrating 1  Moving slowly or fidgety/restless 0  Suicidal thoughts 0  PHQ-9 Score 8  Difficult doing work/chores Not difficult at all   Subjective:   1. Other fatigue Vanessa Dean admits to daytime somnolence and denies waking up still tired. Patent has a history of symptoms of daytime fatigue and morning headache. Vanessa Dean generally gets 6 or 7 hours of sleep per night, and states that she has poor quality sleep. Snoring is not present. Apneic episodes are not present. Epworth Sleepiness Score is 10.  Vanessa Dean has OSA and uses a CPAP machine.  2. Shortness of breath on exertion Vanessa Dean Kindred notes increasing shortness of breath with exercising and seems to be worsening over time with weight gain. She notes getting out of breath sooner with activity than she used to. This has gotten worse recently. Vanessa Dean denies shortness of breath at rest or orthopnea.  3. Type 2 diabetes mellitus without complication, without long-term current use of insulin (HCC) Medications reviewed. Diabetic ROS: no polyuria or polydipsia, no chest pain, dyspnea or TIA's, no numbness, tingling or pain in extremities.   Lab Results  Component Value Date   HGBA1C 6.5 (H) 09/09/2019   Lab Results  Component Value Date   LDLCALC 116 (H) 09/09/2019   CREATININE 0.94 09/09/2019   Lab Results  Component Value Date   INSULIN 15.4 09/09/2019   4. Hypertension associated with diabetes ([place]) Review: taking medications as instructed, no medication side  effects noted, no chest pain on exertion, no dyspnea on exertion, no swelling of ankles.   BP Readings from Last 3 Encounters:  09/09/19 112/79  06/30/19 136/78  06/24/19 133/83   5. Hyperlipidemia associated with type 2 diabetes mellitus ([place]) Vanessa Dean has hyperlipidemia and has been trying to improve her cholesterol levels with intensive lifestyle modification including a low saturated  fat diet, exercise and weight loss. She denies any chest pain, claudication or myalgias.  Lab Results  Component Value Date   ALT 18 09/09/2019   AST 18 09/09/2019   ALKPHOS 143 (H) 09/09/2019   BILITOT 0.5 09/09/2019   Lab Results  Component Value Date   CHOL 194 09/09/2019   HDL 52 09/09/2019   LDLCALC 116 (H) 09/09/2019   TRIG 146 09/09/2019   6. OSA (obstructive sleep apnea) Vanessa Dean has a diagnosis of sleep apnea. She reports that she is using a CPAP regularly.   7. Vitamin B 12 deficiency She notes fatigue. She is not a vegetarian.  She does not have a previous diagnosis of pernicious anemia.  She does not have a history of weight loss surgery.   Lab Results  Component Value Date   [id number] 888 09/09/2019   8. Vitamin D deficiency Diagnosis is likely given obesity  She is currently taking no vitamin D supplement. She denies nausea, vomiting or muscle weakness.  9. Other depression with emotional eating Vanessa Dean is struggling with emotional eating and using food for comfort to the extent that it is negatively impacting her health. She has been working on behavior modification techniques to help reduce her emotional eating and has been unsuccessful. She shows no sign of suicidal or homicidal ideations.  10. At risk for hypoglycemia Vanessa Dean is at increased risk for hypoglycemia due to changes in diet, diagnosis of diabetes, and/or insulin use. Vanessa Dean is not currently taking insulin.   Assessment/Plan:   1. Other fatigue [name] does feel that her weight is causing her energy to be lower than it should be. Fatigue may be related to obesity, depression or many other causes. Labs will be ordered, and in the meanwhile, [name] will focus on self care including making healthy food choices, increasing physical activity and focusing on stress reduction. - EKG 12-Lead - Folate - T3 - T4, free - TSH  2. Shortness of breath on exertion [name] does feel that she gets out of breath more  easily that she used to when she exercises. [name]'s shortness of breath appears to be obesity related and exercise induced. She has agreed to work on weight loss and gradually increase exercise to treat her exercise induced shortness of breath. Will continue to monitor closely. - CBC with Differential/Platelet  3. Type 2 diabetes mellitus without complication, without long-term current use of insulin (HCC) Good blood sugar control is important to decrease the likelihood of diabetic complications such as nephropathy, neuropathy, limb loss, blindness, coronary artery disease, and death. Intensive lifestyle modification including diet, exercise and weight loss are the first line of treatment for diabetes.  - Comprehensive metabolic panel - Hemoglobin A1c - Insulin, random  4. Hypertension associated with diabetes ([place]) Nyela is working on healthy weight loss and exercise to improve blood pressure control. We will watch for signs of hypotension as she continues her lifestyle modifications.  5. Hyperlipidemia associated with type 2 diabetes mellitus ([place]) Cardiovascular risk and specific lipid/LDL goals reviewed.  We discussed several lifestyle modifications today and Anyelina will continue to work on diet, exercise and weight loss  efforts. Orders and follow up as documented in patient record.   Counseling Intensive lifestyle modifications are the first line treatment for this issue. . Dietary changes: Increase soluble fiber. Decrease simple carbohydrates. . Exercise changes: Moderate to vigorous-intensity aerobic activity 150 minutes per week if tolerated. . Lipid-lowering medications: see documented in medical record. - Lipid Panel With LDL/HDL Ratio  6. OSA (obstructive sleep apnea) Intensive lifestyle modifications are the first line treatment for this issue. We discussed several lifestyle modifications today and she will continue to work on diet, exercise and weight loss efforts. We will continue  to monitor. Orders and follow up as documented in patient record.   Counseling  Sleep apnea is a condition in which breathing pauses or becomes shallow during sleep. This happens over and over during the night. This disrupts your sleep and keeps your body from getting the rest that it needs, which can cause tiredness and lack of energy (fatigue) during the day.  Sleep apnea treatment: If you were given a device to open your airway while you sleep, USE IT!  Sleep hygiene:   Limit or avoid alcohol, caffeinated beverages, and cigarettes, especially close to bedtime.   Do not eat a large meal or eat spicy foods right before bedtime. This can lead to digestive discomfort that can make it hard for you to sleep.  Keep a sleep diary to help you and your health care provider figure out what could be causing your insomnia.  . Make your bedroom a dark, comfortable place where it is easy to fall asleep. ? Put up shades or blackout curtains to block light from outside. ? Use a white noise machine to block noise. ? Keep the temperature cool. . Limit screen use before bedtime. This includes: ? Watching TV. ? Using your smartphone, tablet, or computer. . Stick to a routine that includes going to bed and waking up at the same times every day and night. This can help you fall asleep faster. Consider making a quiet activity, such as reading, part of your nighttime routine. . Try to avoid taking naps during the day so that you sleep better at night. . Get out of bed if you are still awake after 15 minutes of trying to sleep. Keep the lights down, but try reading or doing a quiet activity. When you feel sleepy, go back to bed.  7. Vitamin B 12 deficiency The diagnosis was reviewed with the patient. Counseling provided today, see below. We will continue to monitor. Orders and follow up as documented in patient record.  Counseling . The body needs vitamin B12: to make red blood cells; to make DNA; and to help  the nerves work properly so they can carry messages from the brain to the body.  . The main causes of vitamin B12 deficiency include dietary deficiency, digestive diseases, pernicious anemia, and having a surgery in which part of the stomach or small intestine is removed.  . Certain medicines can make it harder for the body to absorb vitamin B12. These medicines include: heartburn medications; some antibiotics; some medications used to treat diabetes, gout, and high cholesterol.  . In some cases, there are no symptoms of this condition. If the condition leads to anemia or nerve damage, various symptoms can occur, such as weakness or fatigue, shortness of breath, and numbness or tingling in your hands and feet.   . Treatment:  o May include taking vitamin B12 supplements.  o Avoid alcohol.  o Eat lots of healthy  foods that contain vitamin B12: - Beef, pork, chicken, [place], and organ meats, such as liver.  - Seafood: This includes clams, rainbow trout, salmon, tuna, and haddock. Eggs.  - Cereal and dairy products that are fortified: This means that vitamin B12 has been added to the food.  - Vitamin B12  8. Vitamin D deficiency Low Vitamin D level contributes to fatigue and are associated with obesity, breast, and colon cancer. - VITAMIN D 25 Hydroxy (Vit-D Deficiency, Fractures)  9. Other depression with emotional eating Patient was referred to Dr. Mallie Mussel, our Bariatric Psychologist, for evaluation due to her elevated PHQ-9 score and significant struggles with emotional eating.  10. At risk for hypoglycemia Janifer was given approximately 15 minutes of counseling today regarding prevention of hypoglycemia. She was advised of symptoms of hypoglycemia. Gloris was instructed to avoid skipping meals, eat regular protein rich meals and schedule low calorie snacks as needed.   Repetitive spaced learning was employed today to elicit superior memory formation and behavioral change  11. Class 3 severe  obesity with serious comorbidity and body mass index (BMI) of 45.0 to 49.9 in adult, unspecified obesity type (HCC) Vernelle is currently in the action stage of change and her goal is to continue with weight loss efforts. I recommend Darrion begin the structured treatment plan as follows:  She has agreed to the Category 1 Plan.  Exercise goals: As is.   Behavioral modification strategies: increasing water intake, no skipping meals and planning for success.  She was informed of the importance of frequent follow-up visits to maximize her success with intensive lifestyle modifications for her multiple health conditions. She was informed we would discuss her lab results at her next visit unless there is a critical issue that needs to be addressed sooner. Vanessa Dean Kindred agreed to keep her next visit at the agreed upon time to discuss these results.  Objective:   Blood pressure 112/79, pulse 72, temperature 98.1 F (36.7 C), temperature source Oral, height 5' 5"  (1.651 m), weight 274 lb (124.3 kg), SpO2 95 %. Body mass index is 45.6 kg/m.  EKG: Normal sinus rhythm, rate 66 bpm.  Indirect Calorimeter completed today shows a VO2 of 226 and a REE of 1575.  Her calculated basal metabolic rate is 5809 thus her basal metabolic rate is worse than expected.  General: Cooperative, alert, well developed, in no acute distress. HEENT: Conjunctivae and lids unremarkable. Cardiovascular: Regular rhythm.  Lungs: Normal work of breathing. Neurologic: No focal deficits.   Lab Results  Component Value Date   CREATININE 0.94 09/09/2019   BUN 14 09/09/2019   NA 141 09/09/2019   K 3.8 09/09/2019   CL 103 09/09/2019   CO2 27 09/09/2019   Lab Results  Component Value Date   ALT 18 09/09/2019   AST 18 09/09/2019   ALKPHOS 143 (H) 09/09/2019   BILITOT 0.5 09/09/2019   Lab Results  Component Value Date   HGBA1C 6.5 (H) 09/09/2019   Lab Results  Component Value Date   INSULIN 15.4 09/09/2019   Lab Results    Component Value Date   TSH 0.688 09/09/2019   Lab Results  Component Value Date   CHOL 194 09/09/2019   HDL 52 09/09/2019   LDLCALC 116 (H) 09/09/2019   TRIG 146 09/09/2019   Lab Results  Component Value Date   WBC 6.8 09/09/2019   HGB 12.2 09/09/2019   HCT 37.0 09/09/2019   MCV 91 09/09/2019   PLT 311 09/09/2019   Attestation Statements:  Reviewed by clinician on day of visit: allergies, medications, problem list, medical history, surgical history, family history, social history, and previous encounter notes.  I, Water quality scientist, CMA, am acting as Location manager for [employer], DO.  I have reviewed the above documentation for accuracy and completeness, and I agree with the above. Mellody Dance, DO

## 2019-09-11 NOTE — Progress Notes (Signed)
Office: [phone]  /  Fax: [phone]    Date: September 25, 2019   Appointment Start Time: 12:00pm Duration: 27 minutes Provider: Glennie Dean, Psy.D. Type of Session: Intake for Individual Therapy  Location of Patient: Work Location of Provider: Provider's Home Type of Contact: Telepsychological Visit via MyChart Video Visit  Informed Consent: Prior to proceeding with today's appointment, two pieces of identifying information were obtained. In addition, Vanessa Dean's physical location at the time of this appointment was obtained as well a phone number she could be reached at in the event of technical difficulties. [facility] and this provider participated in today's telepsychological service.   The provider's role was explained to [facility]essa [facility]. The provider reviewed and discussed issues of confidentiality, privacy, and limits therein (e.g., reporting obligations). In addition to verbal informed consent, written informed consent for psychological services was obtained prior to the initial appointment. Since the clinic is not a 24/7 crisis center, mental health emergency resources were shared and this  provider explained MyChart, e-mail, voicemail, and/or other messaging systems should be utilized only for non-emergency reasons. This provider also explained that information obtained during appointments will be placed in [facility] record and relevant information will be shared with other providers at [facility] for coordination of care. Moreover, Vanessa Dean agreed information may be shared with other [facility] providers as needed for coordination of care. By signing the service agreement document, Vanessa Dean provided written consent for coordination of care. Prior to initiating telepsychological services, Vanessa Dean completed an informed consent document, which included the development of a safety plan (i.e., an emergency contact, nearest emergency room, and emergency resources) in the  event of an emergency/crisis. Vanessa Dean expressed understanding of the rationale of the safety plan. Vanessa Dean verbally acknowledged understanding she is ultimately responsible for understanding her insurance benefits for telepsychological and in-person services. This provider also reviewed confidentiality, as it relates to telepsychological services, as well as the rationale for telepsychological services (i.e., to reduce exposure risk to COVID-19). [facility]essa Dean  acknowledged understanding that appointments cannot be recorded without both party consent and she is aware she is responsible for securing confidentiality on her end of the session. [facility]essa Dean verbally consented to proceed.  Chief Complaint/HPI: [facility] Dean was referred by Dr. Mellody Dean due to other depression, with emotional eating. Per the note for the initial visit with Dr. Mellody Dean on September 09, 2019, "Vanessa Dean is struggling with emotional eating and using food for comfort to the extent that it is negatively impacting her health. She has been working on behavior modification techniques to help reduce her emotional eating and has been unsuccessful. She shows no sign of suicidal or homicidal ideations." The note for the initial appointment with Dr. Mellody Dean indicated the following: "Vanessa Dean's habits were reviewed today and are as follows: Her family eats meals together, she thinks her family will eat healthier with her, she struggles with family and or coworkers weight loss sabotage, her desired weight loss is 95 pounds, she has been heavy most of her life, she started gaining weight around college, her heaviest weight ever was 310 pounds, she craves chips and sweets, she snacks frequently in the evenings, she skips breakfast and lunch frequently, she is frequently drinking liquids with calories, she frequently makes poor food choices and she struggles with emotional eating." [facility]essa Dean's Food and Mood (modified PHQ-9) score on September 09, 2019 was 8.  During today's  appointment, Vanessa Dean described a fluctuation in appetite depending on moods. She was verbally administered a  questionnaire assessing various behaviors related to emotional eating. Vanessa Dean endorsed the following: overeat when you are celebrating, experience food cravings on a regular basis, eat certain foods when you are anxious, stressed, depressed, or your feelings are hurt, find food is comforting to you, not worry about what you eat when you are in a good mood and eat as a reward. [name] believes the onset of emotional eating was likely in childhood and described the current frequency of emotional eating as "monthly." In addition, Vanessa Dean endorsed a history of engaging in binge eating behaviors. She described eating large quantities of food (e.g., chips) while watching television. Vanessa Dean denied a history of restricting food intake, purging and engagement in other compensatory strategies, and has never been diagnosed with an eating disorder. She also denied a history of treatment for emotional eating. Moreover, Vanessa Dean indicated happiness/celebrations triggers emotional eating. Furthermore, Vanessa Dean discussed challenges with balancing work and life.   Mental Status Examination:  Appearance: well groomed and appropriate hygiene  Behavior: appropriate to circumstances Mood: euthymic Affect: mood congruent Speech: normal in rate, volume, and tone Eye Contact: appropriate Psychomotor Activity: appropriate Gait: unable to assess Thought Process: linear, logical, and goal directed  Thought Content/Perception: denies suicidal and homicidal ideation, plan, and intent and no hallucinations, delusions, bizarre thinking or behavior reported or observed Orientation: time, person, place, and purpose of appointment Memory/Concentration: memory, attention, language, and fund of knowledge intact  Insight/Judgment: good  Family & Psychosocial History: Vanessa Dean reported she is married and she has two daughters (ages 26 and 29).  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 Additionally, Vanessa Dean shared her highest level of education obtained is a Ph.D. Currently, Vanessa Dean social support system consists of her family, friends, Medical laboratory scientific officer, and church family. Moreover, Vanessa Dean stated she resides with her husband and youngest daughter.   Medical History:  Past Medical History:  Diagnosis Date  . Anemia   . Anxiety   . Arthritis   . Back pain   . Constipation   . Crohn disease ([place])   . Edema, lower extremity   . Fluid retention in legs   . GERD (gastroesophageal reflux disease)   . High cholesterol   . Hypertension   . Joint pain   . Knee pain   . Lactose intolerance   . Osteoarthritis   . PONV (postoperative nausea and vomiting)   . Pre-diabetes   . Seasonal allergies    TAKES ZYRTEC AS NEEDED  . Sleep apnea    cpap at night  . Snores   . Vitamin D deficiency    Past Surgical History:  Procedure Laterality Date  . ABDOMINAL HYSTERECTOMY    . APPENDECTOMY    . BREAST REDUCTION SURGERY Bilateral 02/12/2019   Procedure: MAMMARY REDUCTION  (BREAST);  Surgeon: Wallace Going, DO;  Location: [facility];  Service: Plastics;  Laterality: Bilateral;  . CARPAL TUNNEL RELEASE  05/16/2011   Procedure: CARPAL TUNNEL RELEASE;  Surgeon: Cammie Sickle., MD;  Location: [facility];  Service: Orthopedics;  Laterality: Left;  . CARPAL TUNNEL RELEASE  08/08/2011   Procedure: CARPAL TUNNEL RELEASE;  Surgeon: Cammie Sickle., MD;  Location: [facility];  Service: Orthopedics;  Laterality: Right;  . CHOLECYSTECTOMY  1988  . COLON SURGERY     patient had intestinal blockage  . COLONOSCOPY    . ESOPHAGOGASTRODUODENOSCOPY    . GASTRIC BYPASS    . IR ANGIO INTRA  EXTRACRAN SEL COM CAROTID INNOMINATE BILAT MOD SED  05/09/2019  . IR ANGIO VERTEBRAL SEL VERTEBRAL BILAT MOD SED  05/09/2019  . IR US GUIDE VASC ACCESS RIGHT  05/09/2019    . KNEE ARTHROSCOPY  05/09/2010   right   . KNEE ARTHROSCOPY  05/10/2007   left  . LUMBAR LAMINECTOMY/DECOMPRESSION MICRODISCECTOMY N/A 10/31/2017   Procedure: LUMBAR LAMINECTOMY/DECOMPRESSION MICRODISCECTOMY ONE LEVEL  LUMBAR FOUR - LUMBAR FIVE DECOMPRESSION;  Surgeon: Melina Schools, MD;  Location: [facility];  Service: Orthopedics;  Laterality: N/A;  LUMBAR LAMINECTOMY/DECOMPRESSION MICRODISCECTOMY ONE LEVEL  LUMBAR FOUR - LUMBAR FIVE DECOMPRESSION  . TOTAL VAGINAL HYSTERECTOMY  07/20/2005   posterior colporrhaphy   Current Outpatient Medications on File Prior to Visit  Medication Sig Dispense Refill  . acetaminophen (TYLENOL) 500 MG tablet Take 500-1,000 mg by mouth every 6 (six) hours as needed for moderate pain or headache.    Marland Kitchen atorvastatin (LIPITOR) 10 MG tablet Take 10 mg by mouth every evening.  1  . diphenhydrAMINE (BENADRYL) 25 MG tablet Take 25 mg by mouth daily as needed for allergies.    . diphenhydramine-acetaminophen (TYLENOL PM) 25-500 MG TABS tablet Take 1-2 tablets by mouth at bedtime as needed (sleep).    Marland Kitchen HYDROcodone-acetaminophen (NORCO/VICODIN) 5-325 MG tablet Take 2 tablets by mouth every 6 (six) hours as needed. 12 tablet 0  . ibuprofen (ADVIL) 800 MG tablet Take 1 tablet (800 mg total) by mouth every 8 (eight) hours as needed for mild pain. 30 tablet 0  . Magnesium 250 MG TABS Take 250 mg by mouth daily.    . Multiple Vitamins-Minerals (MULTIVITAMIN WITH MINERALS) tablet Take 1 tablet by mouth daily.    Marland Kitchen NALTREXONE HCL PO Take 4.5 mg by mouth at bedtime. Naltrexone 1.5 mg capsule    . ondansetron (ZOFRAN) 4 MG tablet Take 1 tablet (4 mg total) by mouth every 6 (six) hours. 12 tablet 0  . Prenatal Vit-Fe Fumarate-FA (PRENATAL PO) Take 1 tablet by mouth daily.    . valsartan-hydrochlorothiazide (DIOVAN-HCT) 320-25 MG tablet Take 1 tablet by mouth daily.  0  . vitamin C (ASCORBIC ACID) 250 MG tablet Take 500 mg by mouth daily.    . Vitamin D, Ergocalciferol, ([name]) 1.25 MG  (50000 UNIT) CAPS capsule Take one tablet wkly 4 capsule 0   No current facility-administered medications on file prior to visit.   Mental Health History: [facility]essa Dean reported she attended 1-2 therapy sessions "years and years ago." She also met with a therapist for bariatric surgery in 2017. She believes 20 years ago she was prescribed an anxiolytic PRN. Vanessa Dean reported there is no history of hospitalizations for psychiatric concerns. Vanessa Dean endorsed a family history of mental health related concerns. She believes her mother experienced symptoms of depression and anxiety. Vanessa Dean reported there is no history of trauma including psychological, physical  and sexual abuse, as well as neglect. However, she believes she experienced high standards by her community resulting in pressure.    Vanessa Dean described her typical mood lately as "pretty upbeat," but she acknowledged current work stressors. Vanessa Dean endorsed current alcohol use, noting she may consume one standard drink at least twice a month. She denied tobacco use. She denied illicit/recreational substance use. Regarding caffeine intake, Vanessa Dean reported consuming one cup of coffee daily and occasional diet soda ("five times a month"). Furthermore, Vanessa Dean indicated she is not experiencing the following: hallucinations and delusions, paranoia, symptoms of mania , social withdrawal, crying spells, panic attacks and decreased motivation. She also denied history  of and current suicidal ideation, plan, and intent; history of and current homicidal ideation, plan, and intent; and history of and current engagement in self-harm.  The following strengths were reported by [facility]essa Dean: good listener, team player, very focused, and good person. The following strengths were observed by this provider: ability to express thoughts and feelings during the therapeutic session, ability to establish and benefit from a therapeutic relationship, willingness to work toward established goal(s) with the  clinic and ability to engage in reciprocal conversation.   Legal History: Mirha reported there is no history of legal involvement.   Structured Assessments Results: The Patient Health Questionnaire-9 (PHQ-9) is a self-report measure that assesses symptoms and severity of depression over the course of the last two weeks. Vanessa Dean obtained a score of 6 suggesting mild depression. Vanessa Dean finds the endorsed symptoms to be not difficult at all. [0= Not at all; 1= Several days; 2= More than half the days; 3= Nearly every day] Vanessa Dean or pleasure in doing things 0  Feeling down, depressed, or hopeless 2  Trouble falling or staying asleep, or sleeping too much 1  Feeling tired or having Vanessa energy 1  Poor appetite or overeating 1  Feeling bad about yourself --- or that you are a failure or have let yourself or your family down 0  Trouble concentrating on things, such as reading the newspaper or watching television 1  Moving or speaking so slowly that other people could have noticed? Or the opposite --- being so fidgety or restless that you have been moving around a lot more than usual 0  Thoughts that you would be better off dead or hurting yourself in some way 0  PHQ-9 Score 6    The Generalized Anxiety Disorder-7 (GAD-7) is a brief self-report measure that assesses symptoms of anxiety over the course of the last two weeks. Vanessa Dean obtained a score of 1 suggesting minimal anxiety. Celesta finds the endorsed symptoms to be not difficult at all. [0= Not at all; 1= Several days; 2= Over half the days; 3= Nearly every day] Feeling nervous, anxious, on edge 0  Not being able to stop or control worrying 0  Worrying too much about different things 0  Trouble relaxing 1  Being so restless that it's hard to sit still 0  Becoming easily annoyed or irritable 0  Feeling afraid as if something awful might happen 0  GAD-7 Score 1   Interventions:  Conducted a chart review Focused on rapport  building Verbally administered PHQ-9 and GAD-7 for symptom monitoring Verbally administered Food & Mood questionnaire to assess various behaviors related to emotional eating Provided emphatic reflections and validation Psychoeducation provided regarding physical versus emotional hunger  Provisional DSM-5 Diagnosis(es): 311 (F32.8) Other Specified Depressive Disorder, Emotional Eating Behaviors  Plan: This provider recommended additional follow-up appointments based on history of emotional eating; however, Vanessa Dean declined future appointments with this provider at this time. She believes she has strategies that she can try to implement first. She acknowledged understanding that she may request a follow-up appointment with this provider in the future as long as she is still established with the clinic. Vanessa Dean will be sent a handout via e-mail to increase awareness of hunger patterns and subsequent eating. Vanessa Dean provided verbal consent during today's appointment for this provider to send the handout via e-mail. No further follow-up planned by this provider.

## 2019-09-18 ENCOUNTER — Encounter (INDEPENDENT_AMBULATORY_CARE_PROVIDER_SITE_OTHER): Payer: Self-pay | Admitting: Family Medicine

## 2019-09-18 NOTE — Telephone Encounter (Signed)
Please advise 

## 2019-09-23 ENCOUNTER — Encounter (INDEPENDENT_AMBULATORY_CARE_PROVIDER_SITE_OTHER): Payer: Self-pay | Admitting: Family Medicine

## 2019-09-23 ENCOUNTER — Other Ambulatory Visit: Payer: Self-pay

## 2019-09-23 ENCOUNTER — Ambulatory Visit (INDEPENDENT_AMBULATORY_CARE_PROVIDER_SITE_OTHER): Payer: BC Managed Care – PPO | Admitting: Family Medicine

## 2019-09-23 VITALS — BP 121/83 | HR 67 | Temp 98.0°F | Ht 65.0 in | Wt 270.0 lb

## 2019-09-23 DIAGNOSIS — Z9884 Bariatric surgery status: Secondary | ICD-10-CM

## 2019-09-23 DIAGNOSIS — I152 Hypertension secondary to endocrine disorders: Secondary | ICD-10-CM

## 2019-09-23 DIAGNOSIS — Z6841 Body Mass Index (BMI) 40.0 and over, adult: Secondary | ICD-10-CM

## 2019-09-23 DIAGNOSIS — E785 Hyperlipidemia, unspecified: Secondary | ICD-10-CM

## 2019-09-23 DIAGNOSIS — E1169 Type 2 diabetes mellitus with other specified complication: Secondary | ICD-10-CM

## 2019-09-23 DIAGNOSIS — E1159 Type 2 diabetes mellitus with other circulatory complications: Secondary | ICD-10-CM | POA: Diagnosis not present

## 2019-09-23 DIAGNOSIS — E559 Vitamin D deficiency, unspecified: Secondary | ICD-10-CM | POA: Diagnosis not present

## 2019-09-23 DIAGNOSIS — Z9189 Other specified personal risk factors, not elsewhere classified: Secondary | ICD-10-CM | POA: Diagnosis not present

## 2019-09-23 DIAGNOSIS — I10 Essential (primary) hypertension: Secondary | ICD-10-CM

## 2019-09-23 MED ORDER — VITAMIN D (ERGOCALCIFEROL) 1.25 MG (50000 UNIT) PO CAPS: ORAL_CAPSULE | ORAL | 0 refills | Status: DC

## 2019-09-23 NOTE — Patient Instructions (Signed)
The 10-year ASCVD risk score Mikey Bussing DC Brooke Bonito., et al., 2013) is: 7.9%   Values used to calculate the score:     Age: 54 years     Sex: Female     Is Non-Hispanic African American: Yes     Diabetic: Yes     Tobacco smoker: No     Systolic Blood Pressure: 253 mmHg     Is BP treated: Yes     HDL Cholesterol: 52 mg/dL     Total Cholesterol: 194 mg/dL

## 2019-09-24 NOTE — Progress Notes (Signed)
Chief Complaint:   OBESITY Vanessa Dean is here to discuss her progress with her obesity treatment plan along with follow-up of her obesity related diagnoses. Vanessa Dean is on the Category 1 Plan and states she is following her eating plan approximately 75-80% of the time. Vanessa Dean states she is exercising for 0 minutes 0 times per week.  Today's visit was #: 2 Starting weight: 274 lbs Starting date: 09/09/2019 Today's weight: 270 lbs Today's date: 09/23/2019 Total lbs lost to date: 4 lbs Total lbs lost since last in-office visit: 4 lbs  Interim History: Vanessa Dean says that her hunger is pretty well-controlled when she follows the plan, but she sometimes has increased hunger in the late afternoons and evenings.  Also, due to her history of gastric bypass, it is difficult for her to get in all food and drink.  Overall, she likes the plan.  She is here for her first follow-up visit to discuss all labs as well.  She denies concerns.  Subjective:   1. Type 2 diabetes mellitus with other specified complication, without long-term current use of insulin (HCC) Medications reviewed. Diabetic ROS: no polyuria or polydipsia, no chest pain, dyspnea or TIA's, no numbness, tingling or pain in extremities.  Vanessa Dean has been a diabetic "for years".  The best her A1c has been is 8.0 in the past, and then she had the gastic bypass.  Fasting blood sugars are less than 120 lately.  No concerns or symptoms.  Lab Results  Component Value Date   HGBA1C 6.5 (H) 09/09/2019   Lab Results  Component Value Date   LDLCALC 116 (H) 09/09/2019   CREATININE 0.94 09/09/2019   Lab Results  Component Value Date   INSULIN 15.4 09/09/2019   2. Hypertension associated with diabetes (Vanessa Dean) Review: taking medications as instructed, no medication side effects noted, no chest pain on exertion, no dyspnea on exertion, no swelling of ankles.  Blood pressure is at goal.  She is on valsartan-HCTZ.  Denies concerns.  BP Readings from Last 3  Encounters:  09/23/19 121/83  09/09/19 112/79  06/30/19 136/78   3. S/P gastric bypass Vanessa Dean has difficulty eating or drinking larger amounts at one time due to bypass surgery.  She will vomit if she eats too much at one time.  It is difficult for her to get all food in at each meal of the day at times.  It is especially difficult to drink water in addition.  4. Hyperlipidemia associated with type 2 diabetes mellitus (Vanessa Dean) Vanessa Dean has hyperlipidemia and has been trying to improve her cholesterol levels with intensive lifestyle modification including a low saturated fat diet, exercise and weight loss. She denies any chest pain, claudication or myalgias.  LDL is less than 70 and not at goal currently.  She is on low dose Lipitor of 10 mg at bedtime.  She takes it regularly.  No symptoms or concerns.  Lab Results  Component Value Date   ALT 18 09/09/2019   AST 18 09/09/2019   ALKPHOS 143 (H) 09/09/2019   BILITOT 0.5 09/09/2019   Lab Results  Component Value Date   CHOL 194 09/09/2019   HDL 52 09/09/2019   LDLCALC 116 (H) 09/09/2019   TRIG 146 09/09/2019   5. Vitamin D deficiency Vanessa Dean's Vitamin D level was 30.9 on 09/09/2019. She is currently taking no vitamin D supplement. She denies nausea, vomiting or muscle weakness.  She endorses fatigue and tiredness.  6. At risk for hypoglycemia Vanessa Dean is at increased  risk for hypoglycemia due to changes in diet, diagnosis of diabetes, and/or insulin use. Vanessa Dean is not currently taking insulin.   Assessment/Plan:   1. Type 2 diabetes mellitus with other specified complication, without long-term current use of insulin ([place]) Discussed labs with patient today.  Good blood sugar control is important to decrease the likelihood of diabetic complications such as nephropathy, neuropathy, limb loss, blindness, coronary artery disease, and death. Intensive lifestyle modification including diet, exercise and weight loss are the first line of treatment for  diabetes.  Goal fasting blood sugar less than 100, but definitely less than 125.  She declines medication at this time.  Will recheck in 3 months.  Goal A1c is less than 6.0.  Continue prudent nutritional plan, weight loss.  2. Hypertension associated with diabetes ([place]) Discussed labs with patient today.  Vanessa Dean is working on healthy weight loss and exercise to improve blood pressure control. We will watch for signs of hypotension as she continues her lifestyle modifications.  Continue medications per PCP.  Will monitor closely and adjust dose as patient loses weight.  Education provided to her on decreasing weight equaling decreasing blood pressure, etc.  3. S/P gastric bypass Discussed labs with patient today.  Vanessa Dean is at risk for malnutrition due to her previous bariatric surgery.  Eat and drink multiple times per day due to surgical limitations.  Counseling  You may need to eat 3 meals and 2 snacks, or 5 small meals each day in order to reach your protein and calorie goals.   Allow at least 15 minutes for each meal so that you can eat mindfully. Listen to your body so that you do not overeat. For most people, your sleeve or pouch will comfortably hold 4-6 ounces.  Eat foods from all food groups. This includes fruits and vegetables, grains, dairy, and meat and other proteins.  Include a protein-rich food at every meal and snack, and eat the protein food first.   You should be taking a Bariatric Multivitamin as well as calcium.   4. Hyperlipidemia associated with type 2 diabetes mellitus (Vanessa Dean) Discussed labs with patient today.  Cardiovascular risk and specific lipid/LDL goals reviewed.  We discussed several lifestyle modifications today and Vanessa Dean will continue to work on diet, exercise and weight loss efforts. Orders and follow up as documented in patient record.  Recommend she follow-up with her PCP regarding cholesterol dosing.  She declines increased dose today.  Continue prudent  nutritional plan, weight loss.   Counseling Intensive lifestyle modifications are the first line treatment for this issue. . Dietary changes: Increase soluble fiber. Decrease simple carbohydrates. . Exercise changes: Moderate to vigorous-intensity aerobic activity 150 minutes per week if tolerated. Lipid-lowering medications: see documented in medical record.  5. Vitamin D deficiency New. Discussed labs with patient today.  Low Vitamin D level contributes to fatigue and are associated with obesity, breast, and colon cancer. She agrees to start to take prescription Vitamin D @50 ,000 IU every week and will follow-up for routine testing of Vitamin D, at least 2-3 times per year to avoid over-replacement.  Will recheck levels in 3 months.  - Vitamin D, Ergocalciferol, ([name]) 1.25 MG (50000 UNIT) CAPS capsule; Take one tablet wkly  Dispense: 4 capsule; Refill: 0  6. At risk for hypoglycemia Vanessa Dean was given approximately 15 minutes of counseling today regarding prevention of hypoglycemia. She was advised of symptoms of hypoglycemia. Vanessa Dean was instructed to avoid skipping meals, eat regular protein rich meals and schedule low calorie snacks  as needed.   Repetitive spaced learning was employed today to elicit superior memory formation and behavioral change  7. Class 3 severe obesity with serious comorbidity and body mass index (BMI) of 40.0 to 44.9 in adult, unspecified obesity type (HCC) Vanessa Dean is currently in the action stage of change. As such, her goal is to continue with weight loss efforts. She has agreed to the Category 1 Plan.   Exercise goals: As is.  Behavioral modification strategies: increasing lean protein intake, decreasing simple carbohydrates, increasing water intake, meal planning and cooking strategies, keeping healthy foods in the home and planning for success.  Vanessa Dean has agreed to follow-up with our clinic in 2 weeks. She was informed of the importance of frequent follow-up  visits to maximize her success with intensive lifestyle modifications for her multiple health conditions.   Objective:   Blood pressure 121/83, pulse 67, temperature 98 F (36.7 C), temperature source Oral, height 5' 5"  (1.651 m), weight 270 lb (122.5 kg), SpO2 95 %. Body mass index is 44.93 kg/m.  General: Cooperative, alert, well developed, in no acute distress. HEENT: Conjunctivae and lids unremarkable. Cardiovascular: Regular rhythm.  Lungs: Normal work of breathing. Neurologic: No focal deficits.   Lab Results  Component Value Date   CREATININE 0.94 09/09/2019   BUN 14 09/09/2019   NA 141 09/09/2019   K 3.8 09/09/2019   CL 103 09/09/2019   CO2 27 09/09/2019   Lab Results  Component Value Date   ALT 18 09/09/2019   AST 18 09/09/2019   ALKPHOS 143 (H) 09/09/2019   BILITOT 0.5 09/09/2019   Lab Results  Component Value Date   HGBA1C 6.5 (H) 09/09/2019   Lab Results  Component Value Date   INSULIN 15.4 09/09/2019   Lab Results  Component Value Date   TSH 0.688 09/09/2019   Lab Results  Component Value Date   CHOL 194 09/09/2019   HDL 52 09/09/2019   LDLCALC 116 (H) 09/09/2019   TRIG 146 09/09/2019   Lab Results  Component Value Date   WBC 6.8 09/09/2019   HGB 12.2 09/09/2019   HCT 37.0 09/09/2019   MCV 91 09/09/2019   PLT 311 09/09/2019   Attestation Statements:   Reviewed by clinician on day of visit: allergies, medications, problem list, medical history, surgical history, family history, social history, and previous encounter notes.  I, Water quality scientist, CMA, am acting as Location manager for [employer], DO.  I have reviewed the above documentation for accuracy and completeness, and I agree with the above. -  Mellody Dance, DO

## 2019-09-25 ENCOUNTER — Encounter (INDEPENDENT_AMBULATORY_CARE_PROVIDER_SITE_OTHER): Payer: Self-pay | Admitting: Family Medicine

## 2019-09-25 ENCOUNTER — Other Ambulatory Visit: Payer: Self-pay

## 2019-09-25 ENCOUNTER — Telehealth (INDEPENDENT_AMBULATORY_CARE_PROVIDER_SITE_OTHER): Payer: BC Managed Care – PPO | Admitting: Psychology

## 2019-09-25 DIAGNOSIS — F3289 Other specified depressive episodes: Secondary | ICD-10-CM

## 2019-09-25 NOTE — Telephone Encounter (Signed)
FYI

## 2019-09-29 NOTE — Telephone Encounter (Signed)
Please advise thanks.

## 2019-10-13 ENCOUNTER — Other Ambulatory Visit: Payer: Self-pay

## 2019-10-13 ENCOUNTER — Ambulatory Visit (INDEPENDENT_AMBULATORY_CARE_PROVIDER_SITE_OTHER): Payer: BC Managed Care – PPO | Admitting: Family Medicine

## 2019-10-13 ENCOUNTER — Encounter (INDEPENDENT_AMBULATORY_CARE_PROVIDER_SITE_OTHER): Payer: Self-pay | Admitting: Family Medicine

## 2019-10-13 VITALS — BP 119/78 | HR 67 | Temp 98.2°F | Ht 65.0 in | Wt 267.0 lb

## 2019-10-13 DIAGNOSIS — E559 Vitamin D deficiency, unspecified: Secondary | ICD-10-CM

## 2019-10-13 DIAGNOSIS — Z9189 Other specified personal risk factors, not elsewhere classified: Secondary | ICD-10-CM | POA: Diagnosis not present

## 2019-10-13 DIAGNOSIS — Z6841 Body Mass Index (BMI) 40.0 and over, adult: Secondary | ICD-10-CM

## 2019-10-13 DIAGNOSIS — E1169 Type 2 diabetes mellitus with other specified complication: Secondary | ICD-10-CM

## 2019-10-13 MED ORDER — VITAMIN D (ERGOCALCIFEROL) 1.25 MG (50000 UNIT) PO CAPS: ORAL_CAPSULE | ORAL | 0 refills | Status: DC

## 2019-10-15 ENCOUNTER — Encounter (INDEPENDENT_AMBULATORY_CARE_PROVIDER_SITE_OTHER): Payer: Self-pay | Admitting: Family Medicine

## 2019-10-15 DIAGNOSIS — E559 Vitamin D deficiency, unspecified: Secondary | ICD-10-CM | POA: Insufficient documentation

## 2019-10-15 DIAGNOSIS — E119 Type 2 diabetes mellitus without complications: Secondary | ICD-10-CM | POA: Insufficient documentation

## 2019-10-15 NOTE — Progress Notes (Signed)
Chief Complaint:   OBESITY Vanessa Dean is here to discuss her progress with her obesity treatment plan along with follow-up of her obesity related diagnoses. Vanessa Dean is on the Category 1 Plan and states she is following her eating plan approximately 90% of the time. Vanessa Dean states she is doing 0 minutes 0 times per week.  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 Her highest weight was 310 lbs and nadir 250 lbs. She started regaining 1 year post op.  She has cut out sweet tea since starting our program. She is working on getting protein in. She splits up the protein throughout the day.  Subjective:   1. Vitamin D deficiency Vanessa Dean's last Vit D level was low at 30.9. She is on prescription Vit D.  2. Type 2 diabetes mellitus with other specified complication, without long-term current use of insulin ([place]) Vanessa Dean's last A1c was 8.0 pre-gastric bypass. Her A1c is now 6.5. She is not on medications.  Lab Results  Component Value Date   HGBA1C 6.5 (H) 09/09/2019   Lab Results  Component Value Date   LDLCALC 116 (H) 09/09/2019   CREATININE 0.94 09/09/2019   Lab Results  Component Value Date   INSULIN 15.4 09/09/2019   3. At risk for malnutrition Vanessa Dean is at increased risk for malnutrition due to insufficient protein.  Assessment/Plan:   1. Vitamin D deficiency Low Vitamin D level contributes to fatigue and are associated with obesity, breast, and colon cancer. We will refill prescription Vitamin D for 1 month. Vanessa Dean will follow-up for routine testing of Vitamin D, at least 2-3 times per year to avoid over-replacement.  - Vitamin D, Ergocalciferol, ([name]) 1.25 MG (50000 UNIT) CAPS capsule; Take one tablet wkly  Dispense: 4 capsule; Refill: 0  2. Type 2 diabetes mellitus with  other specified complication, without long-term current use of insulin (HCC) Good blood sugar control is important to decrease the likelihood of diabetic complications such as nephropathy, neuropathy, limb loss, blindness, coronary artery disease, and death. Intensive lifestyle modification including diet, exercise and weight loss are the first line of treatment for diabetes. Vanessa Dean will continue her meal plan, and will continue to follow up as directed.  3. At risk for malnutrition Vanessa Dean was given approximately 15 minutes of counseling today regarding prevention of malnutrition and ways to meet macronutrient goals.   4. Class 3 severe obesity with serious comorbidity and body mass index (BMI) of 40.0 to 44.9 in adult, unspecified obesity type (HCC) Vanessa Dean is currently in the action stage of change. As such, her goal is to continue with weight loss efforts. She has agreed to the Category 1 Plan with additional lunch options, and keeping a food journal and adhering to recommended goals of 300-450 calories and 35 grams of protein at supper daily.   Exercise goals: No exercise has been prescribed at this time.  Behavioral modification strategies: increasing lean protein intake and meal planning and cooking strategies.  Vanessa Dean has agreed to follow-up with our clinic in 2 weeks. She was informed of the importance of frequent follow-up visits to maximize her success with intensive lifestyle modifications for her multiple health conditions.   Objective:   Blood pressure 119/78, pulse 67, temperature 98.2 F (36.8 C), temperature source Oral, height 5' 5"  (1.651  m), weight 267 lb (121.1 kg), SpO2 97 %. Body mass index is 44.43 kg/m.  General: Cooperative, alert, well developed, in no acute distress. HEENT: Conjunctivae and lids unremarkable. Cardiovascular: Regular rhythm.  Lungs: Normal work of breathing. Neurologic: No focal deficits.   Lab Results  Component Value Date   CREATININE 0.94  09/09/2019   BUN 14 09/09/2019   NA 141 09/09/2019   K 3.8 09/09/2019   CL 103 09/09/2019   CO2 27 09/09/2019   Lab Results  Component Value Date   ALT 18 09/09/2019   AST 18 09/09/2019   ALKPHOS 143 (H) 09/09/2019   BILITOT 0.5 09/09/2019   Lab Results  Component Value Date   HGBA1C 6.5 (H) 09/09/2019   Lab Results  Component Value Date   INSULIN 15.4 09/09/2019   Lab Results  Component Value Date   TSH 0.688 09/09/2019   Lab Results  Component Value Date   CHOL 194 09/09/2019   HDL 52 09/09/2019   LDLCALC 116 (H) 09/09/2019   TRIG 146 09/09/2019   Lab Results  Component Value Date   WBC 6.8 09/09/2019   HGB 12.2 09/09/2019   HCT 37.0 09/09/2019   MCV 91 09/09/2019   PLT 311 09/09/2019   No results found for: IRON, TIBC, FERRITIN  Attestation Statements:   Reviewed by clinician on day of visit: allergies, medications, problem list, medical history, surgical history, family history, social history, and previous encounter notes.   Wilhemena Durie, am acting as Location manager for [employer], FNP-C.  I have reviewed the above documentation for accuracy and completeness, and I agree with the above. -  Georgianne Fick, FNP

## 2019-10-28 ENCOUNTER — Encounter (INDEPENDENT_AMBULATORY_CARE_PROVIDER_SITE_OTHER): Payer: Self-pay | Admitting: Family Medicine

## 2019-10-28 ENCOUNTER — Other Ambulatory Visit: Payer: Self-pay

## 2019-10-28 ENCOUNTER — Ambulatory Visit (INDEPENDENT_AMBULATORY_CARE_PROVIDER_SITE_OTHER): Payer: BC Managed Care – PPO | Admitting: Family Medicine

## 2019-10-28 VITALS — BP 105/70 | HR 72 | Temp 97.5°F | Ht 65.0 in | Wt 263.0 lb

## 2019-10-28 DIAGNOSIS — Z9884 Bariatric surgery status: Secondary | ICD-10-CM

## 2019-10-28 DIAGNOSIS — E1169 Type 2 diabetes mellitus with other specified complication: Secondary | ICD-10-CM | POA: Diagnosis not present

## 2019-10-28 DIAGNOSIS — Z6841 Body Mass Index (BMI) 40.0 and over, adult: Secondary | ICD-10-CM

## 2019-10-29 ENCOUNTER — Encounter (INDEPENDENT_AMBULATORY_CARE_PROVIDER_SITE_OTHER): Payer: Self-pay | Admitting: Family Medicine

## 2019-10-29 DIAGNOSIS — Z9884 Bariatric surgery status: Secondary | ICD-10-CM | POA: Insufficient documentation

## 2019-10-29 NOTE — Progress Notes (Signed)
Chief Complaint:   OBESITY Vanessa Dean is here to discuss her progress with her obesity treatment plan along with follow-up of her obesity related diagnoses. Vanessa Dean is on the Category 1 Plan and keeping a food journal and adhering to recommended goals of 300-450 calories and 35 grams of protein at supper daily and states she is following her eating plan approximately 90-95% of the time. Vanessa Dean states she is doing 0 minutes 0 times per week.  Today's visit was #: 4 Starting weight: 274 lbs Starting date: 09/09/2019 Today's weight: 263 lbs Today's date: 10/28/2019 Total lbs lost to date: 11 Total lbs lost since last in-office visit: 4  Interim History: Vanessa Dean has adhered to the plan very well. She notes she does well with the structure of being at work versus working from home. She notes increased hunger over the past few days. She notes she may have not had enough meat on her sandwiches at lunch because she did not weight the meat.  Subjective:   1. Type 2 diabetes mellitus with other specified complication, without long-term current use of insulin ([place]) Vanessa Dean's last A1c was 6.5. Her A1c was 8.0 before her Roux-en-y in 2017. She notes polyphagia in the afternoon. She is not on metformin.  Lab Results  Component Value Date   HGBA1C 6.5 (H) 09/09/2019   Lab Results  Component Value Date   LDLCALC 116 (H) 09/09/2019   CREATININE 0.94 09/09/2019   Lab Results  Component Value Date   INSULIN 15.4 09/09/2019   2. S/P gastric bypass Vanessa Dean is on a bariatric vitamin daily. She has not been following up with Vanessa Dean, her bariatric surgeon.  Assessment/Plan:   1. Type 2 diabetes mellitus with other specified complication, without long-term current use of insulin (HCC)  Vanessa Dean will continue her meal plan, and will follow up as directed.  2. S/P gastric bypass Vanessa Dean will continue her bariatric multivitamin daily.  3. Class 3 severe obesity with serious comorbidity and body mass index  (BMI) of 40.0 to 44.9 in adult, unspecified obesity type (HCC) Vanessa Dean is currently in the action stage of change. As such, her goal is to continue with weight loss efforts. She has agreed to the Category 1 Plan.   Exercise goals: No exercise has been prescribed at this time.  Behavioral modification strategies: meal planning and cooking strategies.  Vanessa Dean has agreed to follow-up with our clinic in 2 weeks. She was informed of the importance of frequent follow-up visits to maximize her success with intensive lifestyle modifications for her multiple health conditions.   Objective:   Blood pressure 105/70, pulse 72, temperature (!) 97.5 F (36.4 C), temperature source Oral, height 5' 5"  (1.651 m), weight 263 lb (119.3 kg), SpO2 98 %. Body mass index is 43.77 kg/m.  General: Cooperative, alert, well developed, in no acute distress. HEENT: Conjunctivae and lids unremarkable. Cardiovascular: Regular rhythm.  Lungs: Normal work of breathing. Neurologic: No focal deficits.   Lab Results  Component Value Date   CREATININE 0.94 09/09/2019   BUN 14 09/09/2019   NA 141 09/09/2019   K 3.8 09/09/2019   CL 103 09/09/2019   CO2 27 09/09/2019   Lab Results  Component Value Date   ALT 18 09/09/2019   AST 18 09/09/2019   ALKPHOS 143 (H) 09/09/2019   BILITOT 0.5 09/09/2019   Lab Results  Component Value Date   HGBA1C 6.5 (H) 09/09/2019   Lab Results  Component Value Date   INSULIN 15.4 09/09/2019  Lab Results  Component Value Date   TSH 0.688 09/09/2019   Lab Results  Component Value Date   CHOL 194 09/09/2019   HDL 52 09/09/2019   LDLCALC 116 (H) 09/09/2019   TRIG 146 09/09/2019   Lab Results  Component Value Date   WBC 6.8 09/09/2019   HGB 12.2 09/09/2019   HCT 37.0 09/09/2019   MCV 91 09/09/2019   PLT 311 09/09/2019   No results found for: IRON, TIBC, FERRITIN  Attestation Statements:   Reviewed by clinician on day of visit: allergies, medications, problem list,  medical history, surgical history, family history, social history, and previous encounter notes.   Wilhemena Durie, am acting as Location manager for [employer], FNP-C.  I have reviewed the above documentation for accuracy and completeness, and I agree with the above. -  Georgianne Fick, FNP

## 2019-10-30 ENCOUNTER — Ambulatory Visit: Payer: BC Managed Care – PPO | Admitting: Neurology

## 2019-11-19 ENCOUNTER — Other Ambulatory Visit: Payer: Self-pay

## 2019-11-19 ENCOUNTER — Ambulatory Visit (INDEPENDENT_AMBULATORY_CARE_PROVIDER_SITE_OTHER): Payer: BC Managed Care – PPO | Admitting: Adult Health

## 2019-11-19 VITALS — BP 108/76 | HR 93 | Temp 98.0°F | Ht 65.0 in | Wt 261.0 lb

## 2019-11-19 DIAGNOSIS — E7849 Other hyperlipidemia: Secondary | ICD-10-CM

## 2019-11-19 DIAGNOSIS — Z9189 Other specified personal risk factors, not elsewhere classified: Secondary | ICD-10-CM | POA: Diagnosis not present

## 2019-11-19 DIAGNOSIS — E559 Vitamin D deficiency, unspecified: Secondary | ICD-10-CM

## 2019-11-19 DIAGNOSIS — E1169 Type 2 diabetes mellitus with other specified complication: Secondary | ICD-10-CM

## 2019-11-19 DIAGNOSIS — Z6841 Body Mass Index (BMI) 40.0 and over, adult: Secondary | ICD-10-CM

## 2019-11-19 MED ORDER — VITAMIN D (ERGOCALCIFEROL) 1.25 MG (50000 UNIT) PO CAPS: ORAL_CAPSULE | ORAL | 0 refills | Status: DC

## 2019-11-20 NOTE — Progress Notes (Signed)
Chief Complaint:   OBESITY Vanessa Dean is here to discuss her progress with her obesity treatment plan along with follow-up of her obesity related diagnoses. Vanessa Dean is on the Category 1 Plan and states she is following her eating plan approximately 70% of the time. Vanessa Dean states she is walking at work daily 5 days per week.  Today's visit was #: 5 Starting weight: 274 lbs Starting date: 09/09/2019 Today's weight: 261 lbs Today's date: 11/19/2019 Total lbs lost to date: 13 lbs Total lbs lost since last in-office visit: 2 lbs  Interim History: Vanessa Dean deviated from the plan the last 2 weeks due to a hectic work schedule and exacerbation of chronic back pain.  She is followed by Ortho/Dr. Rolena Infante.  She had an MRI on 11/17/2019, and will follow-up with Dr. Rolena Infante on 11/24/2019, to discuss results and treatment options.  Subjective:   1. Vitamin D deficiency Vanessa Dean's Vitamin D level was 30.9 on 09/09/2019. She is currently taking prescription vitamin D 50,000 IU each week. She denies nausea, vomiting or muscle weakness.  2. Type 2 diabetes mellitus with other specified complication, without long-term current use of insulin (Vanessa Dean) On 09/09/2019, A1c was 6.5.  She declined metformin and prefers to normalize blood glucose with lifestyle.  Lab Results  Component Value Date   HGBA1C 6.5 (H) 09/09/2019   Lab Results  Component Value Date   LDLCALC 116 (H) 09/09/2019   CREATININE 0.94 09/09/2019   Lab Results  Component Value Date   INSULIN 15.4 09/09/2019   3. Other hyperlipidemia pure Vanessa Dean has hyperlipidemia and has been trying to improve her cholesterol levels with intensive lifestyle modification including a low saturated fat diet, exercise and weight loss. She denies any chest pain, claudication or myalgias.  On 09/09/2019, LDL cholesterol was elevated.  She is on atorvastatin 10 mg daily.  She reports inconsistent use at the time of her last lipid panel.  Lab Results  Component Value Date   ALT  18 09/09/2019   AST 18 09/09/2019   ALKPHOS 143 (H) 09/09/2019   BILITOT 0.5 09/09/2019   Lab Results  Component Value Date   CHOL 194 09/09/2019   HDL 52 09/09/2019   LDLCALC 116 (H) 09/09/2019   TRIG 146 09/09/2019   4. At risk for heart disease Vanessa Dean is at a higher than average risk for cardiovascular disease due to obesity, hyperlipidemia, and type 2 diabetes mellitus.   Assessment/Plan:   1. Vitamin D deficiency Discussed labs with patient today.  Low Vitamin D level contributes to fatigue and are associated with obesity, breast, and colon cancer. She agrees to continue to take prescription Vitamin D @50 ,000 IU every week and will follow-up for routine testing of Vitamin D, at least 2-3 times per year to avoid over-replacement.  -Refill Vitamin D, Ergocalciferol, ([name]) 1.25 MG (50000 UNIT) CAPS capsule; Take one tablet wkly  Dispense: 4 capsule; Refill: 0  2. Type 2 diabetes mellitus with other specified complication, without long-term current use of insulin (Vanessa Dean) Discussed labs with patient today.  Good blood sugar control is important to decrease the likelihood of diabetic complications such as nephropathy, neuropathy, limb loss, blindness, coronary artery disease, and death. Intensive lifestyle modification including diet, exercise and weight loss are the first line of treatment for diabetes.  Continue Category 1 and remain as active as tolerated.  3. Other hyperlipidemia pure Discussed labs with patient today.  Cardiovascular risk and specific lipid/LDL goals reviewed.  We discussed several lifestyle modifications today and  Vanessa Dean will continue to work on diet, exercise and weight loss efforts. Orders and follow up as documented in patient record.  Continue daily statin therapy and Category 1 meal plan.  Counseling Intensive lifestyle modifications are the first line treatment for this issue. . Dietary changes: Increase soluble fiber. Decrease simple  carbohydrates. . Exercise changes: Moderate to vigorous-intensity aerobic activity 150 minutes per week if tolerated. . Lipid-lowering medications: see documented in medical record.  4. At risk for heart disease Discussed labs with patient today.  Vanessa Dean was given approximately 15 minutes of coronary artery disease prevention counseling today. She is 54 y.o. female and has risk factors for heart disease including obesity. We discussed intensive lifestyle modifications today with an emphasis on specific weight loss instructions and strategies.   Repetitive spaced learning was employed today to elicit superior memory formation and behavioral change.  5. Class 3 severe obesity with serious comorbidity and body mass index (BMI) of 40.0 to 44.9 in adult, unspecified obesity type (HCC) Vanessa Dean is currently in the action stage of change. As such, her goal is to continue with weight loss efforts. She has agreed to the Category 1 Plan.   Exercise goals: As is.  Behavioral modification strategies: increasing lean protein intake, increasing water intake, meal planning and cooking strategies and planning for success.  Vanessa Dean has agreed to follow-up with our clinic in 2 weeks. She was informed of the importance of frequent follow-up visits to maximize her success with intensive lifestyle modifications for her multiple health conditions.   Objective:   Blood pressure 108/76, pulse 93, temperature 98 F (36.7 C), height 5' 5"  (1.651 m), weight 261 lb (118.4 kg), SpO2 98 %. Body mass index is 43.43 kg/m.  General: Cooperative, alert, well developed, in no acute distress. HEENT: Conjunctivae and lids unremarkable. Cardiovascular: Regular rhythm.  Lungs: Normal work of breathing. Neurologic: No focal deficits.   Lab Results  Component Value Date   CREATININE 0.94 09/09/2019   BUN 14 09/09/2019   NA 141 09/09/2019   K 3.8 09/09/2019   CL 103 09/09/2019   CO2 27 09/09/2019   Lab Results  Component  Value Date   ALT 18 09/09/2019   AST 18 09/09/2019   ALKPHOS 143 (H) 09/09/2019   BILITOT 0.5 09/09/2019   Lab Results  Component Value Date   HGBA1C 6.5 (H) 09/09/2019   Lab Results  Component Value Date   INSULIN 15.4 09/09/2019   Lab Results  Component Value Date   TSH 0.688 09/09/2019   Lab Results  Component Value Date   CHOL 194 09/09/2019   HDL 52 09/09/2019   LDLCALC 116 (H) 09/09/2019   TRIG 146 09/09/2019   Lab Results  Component Value Date   WBC 6.8 09/09/2019   HGB 12.2 09/09/2019   HCT 37.0 09/09/2019   MCV 91 09/09/2019   PLT 311 09/09/2019   Attestation Statements:   Reviewed by clinician on day of visit: allergies, medications, problem list, medical history, surgical history, family history, social history, and previous encounter notes.  I, Water quality scientist, CMA, am acting as Location manager for Mina Marble, NP.  I have reviewed the above documentation for accuracy and completeness, and I agree with the above. -  Esaw Grandchild, NP

## 2019-11-24 ENCOUNTER — Other Ambulatory Visit (HOSPITAL_COMMUNITY): Payer: Self-pay | Admitting: Interventional Radiology

## 2019-11-24 DIAGNOSIS — E7849 Other hyperlipidemia: Secondary | ICD-10-CM | POA: Insufficient documentation

## 2019-12-04 ENCOUNTER — Other Ambulatory Visit: Payer: Self-pay

## 2019-12-04 ENCOUNTER — Ambulatory Visit (INDEPENDENT_AMBULATORY_CARE_PROVIDER_SITE_OTHER): Payer: BC Managed Care – PPO | Admitting: Adult Health

## 2019-12-04 ENCOUNTER — Encounter (INDEPENDENT_AMBULATORY_CARE_PROVIDER_SITE_OTHER): Payer: Self-pay | Admitting: Adult Health

## 2019-12-04 VITALS — BP 124/78 | HR 78 | Temp 97.7°F | Ht 65.0 in | Wt 261.0 lb

## 2019-12-04 DIAGNOSIS — I10 Essential (primary) hypertension: Secondary | ICD-10-CM | POA: Diagnosis not present

## 2019-12-04 DIAGNOSIS — Z6841 Body Mass Index (BMI) 40.0 and over, adult: Secondary | ICD-10-CM

## 2019-12-04 DIAGNOSIS — Z9189 Other specified personal risk factors, not elsewhere classified: Secondary | ICD-10-CM | POA: Diagnosis not present

## 2019-12-04 DIAGNOSIS — E559 Vitamin D deficiency, unspecified: Secondary | ICD-10-CM

## 2019-12-04 DIAGNOSIS — E785 Hyperlipidemia, unspecified: Secondary | ICD-10-CM

## 2019-12-04 DIAGNOSIS — E1169 Type 2 diabetes mellitus with other specified complication: Secondary | ICD-10-CM

## 2019-12-04 MED ORDER — VITAMIN D (ERGOCALCIFEROL) 1.25 MG (50000 UNIT) PO CAPS: ORAL_CAPSULE | ORAL | 0 refills | Status: DC

## 2019-12-08 DIAGNOSIS — E1159 Type 2 diabetes mellitus with other circulatory complications: Secondary | ICD-10-CM | POA: Insufficient documentation

## 2019-12-08 DIAGNOSIS — E785 Hyperlipidemia, unspecified: Secondary | ICD-10-CM | POA: Insufficient documentation

## 2019-12-08 DIAGNOSIS — I10 Essential (primary) hypertension: Secondary | ICD-10-CM | POA: Insufficient documentation

## 2019-12-08 DIAGNOSIS — E1169 Type 2 diabetes mellitus with other specified complication: Secondary | ICD-10-CM | POA: Insufficient documentation

## 2019-12-08 DIAGNOSIS — I152 Hypertension secondary to endocrine disorders: Secondary | ICD-10-CM | POA: Insufficient documentation

## 2019-12-08 NOTE — Progress Notes (Signed)
Chief Complaint:   OBESITY Vanessa Dean is here to discuss her progress with her obesity treatment plan along with follow-up of her obesity related diagnoses. Vanessa Dean is on the Category 1 Plan and states she is following her eating plan approximately 70% of the time. Vanessa Dean states she is walking at work for exercise.   Today's visit was #: 6 Starting weight: 274 lbs Starting date: 09/09/2019 Today's weight: 261 lbs Today's date: 12/04/2019 Total lbs lost to date: 13 Total lbs lost since last in-office visit: 0  Interim History: Vanessa Dean reports increased stress at work, which has increased stress eating, i.e., Vanessa Dean cookie, Vanessa Dean lollipop. She estimates to "stress snack" every weekday. She has been trying to increase her daily water intake.  Subjective:   Vitamin D deficiency. Vanessa Dean is on Ergocalciferol. No nausea, vomiting, or muscle weakness.    Ref. Range 09/09/2019 15:53  Vitamin D, 25-Hydroxy Latest Ref Range: 30.0 - 100.0 ng/mL 30.9   Hyperlipidemia associated with type 2 diabetes mellitus (Vanessa Dean). Vanessa Dean is on atorvastatin 10 mg daily and denies myalgias.  Lab Results  Component Value Date   CHOL 194 09/09/2019   HDL 52 09/09/2019   LDLCALC 116 (H) 09/09/2019   TRIG 146 09/09/2019   Lab Results  Component Value Date   ALT 18 09/09/2019   AST 18 09/09/2019   ALKPHOS 143 (H) 09/09/2019   BILITOT 0.5 09/09/2019   The 10-year ASCVD risk score Vanessa Dean DC Jr., et al., 2013) is: 8.6%   Values used to calculate the score:     Age: 54 years     Sex: Female     Is Non-Hispanic African American: Yes     Diabetic: Yes     Tobacco smoker: No     Systolic Blood Pressure: 983 mmHg     Is BP treated: Yes     HDL Cholesterol: 52 mg/dL     Total Cholesterol: 194 mg/dL  Essential (primary) hypertension. Blood pressure and heart rate are excellent at today's office visit. She was on Diovan-HCT 320/25 mg daily, however, PCP decreased her dosage. She believes the new dose is Diovan 160/12.5  mg daily.  BP Readings from Last 3 Encounters:  12/04/19 124/78  11/19/19 108/76  10/28/19 105/70   Lab Results  Component Value Date   CREATININE 0.94 09/09/2019   CREATININE 1.06 (H) 06/30/2019   CREATININE 1.06 (H) 05/09/2019   Type 2 diabetes mellitus with other specified complication, without long-term current use of insulin (Vanessa Dean). A1c has been as high as 8.0 previously. 09/09/2019 blood glucose was 108 with an A1c of 6.5 and an insulin of 15.4. Per Vanessa Dean, she believes her last A1c with her PCP 2 weeks ago was 6.1. She is not on metformin, diet controlled.  Lab Results  Component Value Date   HGBA1C 6.5 (H) 09/09/2019   Lab Results  Component Value Date   LDLCALC 116 (H) 09/09/2019   CREATININE 0.94 09/09/2019   Lab Results  Component Value Date   INSULIN 15.4 09/09/2019   At risk for heart disease. Vanessa Dean is at Vanessa Dean higher than average risk for cardiovascular disease due to hyperlipidemia, type II diabetes mellitus, and obesity.   Assessment/Plan:   Vitamin D deficiency. Low Vitamin D level contributes to fatigue and are associated with obesity, breast, and colon cancer. She was given Vanessa Dean refill on her Vitamin D, Ergocalciferol, ([name]) 1.25 MG (50000 UNIT) CAPS capsule every week #4 with 0 refills and will follow-up for routine testing  of Vitamin D at her next office visit - recent labs with PCP with Vitamin D level.   Hyperlipidemia associated with type 2 diabetes mellitus (Vanessa Dean). Cardiovascular risk and specific lipid/LDL goals reviewed.  We discussed several lifestyle modifications today and Vanessa Dean will continue to work on diet, exercise and weight loss efforts. Orders and follow up as documented in patient record. Vanessa Dean will have labs at her next office visit or continue lipids completed with her PCP.  Counseling Intensive lifestyle modifications are the first line treatment for this issue. . Dietary changes: Increase soluble fiber. Decrease simple carbohydrates. .  Exercise changes: Moderate to vigorous-intensity aerobic activity 150 minutes per week if tolerated. . Lipid-lowering medications: see documented in medical record.  Essential (primary) hypertension. Vanessa Dean is working on healthy weight loss and exercise to improve blood pressure control. We will watch for signs of hypotension as she continues her lifestyle modifications. She will continue her current dose of Diovan as directed.   Type 2 diabetes mellitus with other specified complication, without long-term current use of insulin ([name]). Good blood sugar control is important to decrease the likelihood of diabetic complications such as nephropathy, neuropathy, limb loss, blindness, coronary artery disease, and death. Intensive lifestyle modification including diet, exercise and weight loss are the first line of treatment for diabetes. Vanessa Dean will continue healthy eating and remain as active as possible.  At risk for heart disease. Vanessa Dean was given approximately 15 minutes of coronary artery disease prevention counseling today. She is 54 y.o. female and has risk factors for heart disease including obesity. We discussed intensive lifestyle modifications today with an emphasis on specific weight loss instructions and strategies.   Repetitive spaced learning was employed today to elicit superior memory formation and behavioral change.  Class 3 severe obesity with serious comorbidity and body mass index (BMI) of 40.0 to 44.9 in adult, unspecified obesity type ([name]).  Vanessa Dean is currently in the action stage of change. As such, her goal is to continue with weight loss efforts. She has agreed to the Category 1 Plan. She will choose higher protein snacks, i.e., [name], [name].  Exercise goals: Vanessa Dean will continue her current exercise regimen.   Behavioral modification strategies: increasing lean protein intake, increasing water intake, meal planning and cooking strategies and planning for success.  Vanessa Dean  has agreed to follow-up with our clinic fasting in 2 weeks. She was informed of the importance of frequent follow-up visits to maximize her success with intensive lifestyle modifications for her multiple health conditions.   Objective:   Blood pressure 124/78, pulse 78, temperature 97.7 F (36.5 C), height 5' 5"  (1.651 m), weight 261 lb (118.4 kg), SpO2 100 %. Body mass index is 43.43 kg/m.  General: Cooperative, alert, well developed, in no acute distress. HEENT: Conjunctivae and lids unremarkable. Cardiovascular: Regular rhythm.  Lungs: Normal work of breathing. Neurologic: No focal deficits.   Lab Results  Component Value Date   CREATININE 0.94 09/09/2019   BUN 14 09/09/2019   NA 141 09/09/2019   K 3.8 09/09/2019   CL 103 09/09/2019   CO2 27 09/09/2019   Lab Results  Component Value Date   ALT 18 09/09/2019   AST 18 09/09/2019   ALKPHOS 143 (H) 09/09/2019   BILITOT 0.5 09/09/2019   Lab Results  Component Value Date   HGBA1C 6.5 (H) 09/09/2019   Lab Results  Component Value Date   INSULIN 15.4 09/09/2019   Lab Results  Component Value Date   TSH 0.688 09/09/2019  Lab Results  Component Value Date   CHOL 194 09/09/2019   HDL 52 09/09/2019   LDLCALC 116 (H) 09/09/2019   TRIG 146 09/09/2019   Lab Results  Component Value Date   WBC 6.8 09/09/2019   HGB 12.2 09/09/2019   HCT 37.0 09/09/2019   MCV 91 09/09/2019   PLT 311 09/09/2019   No results found for: IRON, TIBC, FERRITIN  Attestation Statements:   Reviewed by clinician on day of visit: allergies, medications, problem list, medical history, surgical history, family history, social history, and previous encounter notes.  I, Michaelene Song, am acting as Location manager for [employer], NP-C   I have reviewed the above documentation for accuracy and completeness, and I agree with the above. -  Esaw Grandchild, NP

## 2019-12-11 ENCOUNTER — Other Ambulatory Visit (INDEPENDENT_AMBULATORY_CARE_PROVIDER_SITE_OTHER): Payer: Self-pay | Admitting: Adult Health

## 2019-12-11 DIAGNOSIS — E559 Vitamin D deficiency, unspecified: Secondary | ICD-10-CM

## 2019-12-23 ENCOUNTER — Other Ambulatory Visit: Payer: Self-pay

## 2019-12-23 ENCOUNTER — Encounter (INDEPENDENT_AMBULATORY_CARE_PROVIDER_SITE_OTHER): Payer: Self-pay | Admitting: Adult Health

## 2019-12-23 ENCOUNTER — Ambulatory Visit (INDEPENDENT_AMBULATORY_CARE_PROVIDER_SITE_OTHER): Payer: BC Managed Care – PPO | Admitting: Adult Health

## 2019-12-23 VITALS — BP 99/67 | HR 81 | Temp 97.9°F | Ht 65.0 in | Wt 258.0 lb

## 2019-12-23 DIAGNOSIS — I10 Essential (primary) hypertension: Secondary | ICD-10-CM

## 2019-12-23 DIAGNOSIS — E7849 Other hyperlipidemia: Secondary | ICD-10-CM | POA: Diagnosis not present

## 2019-12-23 DIAGNOSIS — E1169 Type 2 diabetes mellitus with other specified complication: Secondary | ICD-10-CM | POA: Diagnosis not present

## 2019-12-23 DIAGNOSIS — Z6841 Body Mass Index (BMI) 40.0 and over, adult: Secondary | ICD-10-CM

## 2019-12-24 NOTE — Progress Notes (Signed)
Chief Complaint:   OBESITY Vanessa Dean is here to discuss her progress with her obesity treatment plan along with follow-up of her obesity related diagnoses. Vanessa Dean is on the Category 1 Plan and states she is following her eating plan approximately 80% of the time. Vanessa Dean states she is walking at work for exercise.   Today's visit was #: 7 Starting weight: 274 lbs Starting date: 09/09/2019 Today's weight: 258 lbs Today's date: 12/23/2019 Total lbs lost to date: 16 Total lbs lost since last in-office visit: 3  Interim History: Vanessa Dean had her annual physical with her PCP/Dr. Dema Dean- with [facility], on 11/21/2019 at which time she had an Influenza vaccine, full labs, and Diovan was reduced to 160/12.5 mg QD. Blood pressure is a little soft today. She has brought the annual labs from [place] and these were reviewed at length with the patient. She continues to enjoy the Category 1 meal plan.  Subjective:   Essential (primary) hypertension. Blood pressure is soft today. Vanessa Dean reports intermittent dizziness with position changes. Her PCP/Dr. Dema Dean recently reduced valsartan/HCTZ 320/25 mg to 160/12.5 mg daily.  BP Readings from Last 3 Encounters:  12/23/19 99/67  12/04/19 124/78  11/19/19 108/76   Lab Results  Component Value Date   CREATININE 0.94 09/09/2019   CREATININE 1.06 (H) 06/30/2019   CREATININE 1.06 (H) 05/09/2019   Other hyperlipidemia. 11/21/2019 labs with Vanessa Dean showed LDL now at goal at 67. Vanessa Dean is on atorvastatin 10 mg daily. CMP and LFT's were within normal limits.  Lab Results  Component Value Date   CHOL 194 09/09/2019   HDL 52 09/09/2019   LDLCALC 116 (H) 09/09/2019   TRIG 146 09/09/2019   Lab Results  Component Value Date   ALT 18 09/09/2019   AST 18 09/09/2019   ALKPHOS 143 (H) 09/09/2019   BILITOT 0.5 09/09/2019   The 10-year ASCVD risk score Vanessa Dean DC Jr., et al., 2013) is: 3.7%   Values used to calculate the score:     Age: 54 years     Sex: Female      Is Non-Hispanic African American: Yes     Diabetic: Yes     Tobacco smoker: No     Systolic Blood Pressure: 99 mmHg     Is BP treated: Yes     HDL Cholesterol: 52 mg/dL     Total Cholesterol: 194 mg/dL  Type 2 diabetes mellitus with other specified complication, without long-term current use of insulin (Vanessa Dean). 11/21/2019 labs with Vanessa Dean showed an A1c of 6.2. Vanessa Dean is on no medication and denies poly's.   Lab Results  Component Value Date   HGBA1C 6.5 (H) 09/09/2019   Lab Results  Component Value Date   LDLCALC 116 (H) 09/09/2019   CREATININE 0.94 09/09/2019   Lab Results  Component Value Date   INSULIN 15.4 09/09/2019   Assessment/Plan:   Essential (primary) hypertension. Vanessa Dean is working on healthy weight loss and exercise to improve blood pressure control. We will watch for signs of hypotension as she continues her lifestyle modifications. She will check her blood pressure and heart rate several times per week and will bring readings to her next office visit. She was instructed to remain well hydrated.  Other hyperlipidemia. Cardiovascular risk and specific lipid/LDL goals reviewed.  We discussed several lifestyle modifications today and Vanessa Dean will continue to work on diet, exercise and weight loss efforts. Orders and follow up as documented in patient record. She will continue statin therapy as directed  and remain active.  Counseling Intensive lifestyle modifications are the first line treatment for this issue. . Dietary changes: Increase soluble fiber. Decrease simple carbohydrates. . Exercise changes: Moderate to vigorous-intensity aerobic activity 150 minutes per week if tolerated. . Lipid-lowering medications: see documented in medical record.  Type 2 diabetes mellitus with other specified complication, without long-term current use of insulin (Vanessa Dean). Good blood sugar control is important to decrease the likelihood of diabetic complications such as nephropathy, neuropathy,  limb loss, blindness, coronary artery disease, and death. Intensive lifestyle modification including diet, exercise and weight loss are the first line of treatment for diabetes. Vanessa Dean will continue healthy eating.  Class 3 severe obesity with serious comorbidity and body mass index (BMI) of 40.0 to 44.9 in adult, unspecified obesity type (Vanessa Dean).  Vanessa Dean is currently in the action stage of change. As such, her goal is to continue with weight loss efforts. She has agreed to the Category 1 Plan.   Exercise goals: No exercise has been prescribed at this time.  Behavioral modification strategies: increasing lean protein intake, increasing water intake, meal planning and cooking strategies and planning for success.  Vanessa Dean has agreed to follow-up with our clinic in 2 weeks. She was informed of the importance of frequent follow-up visits to maximize her success with intensive lifestyle modifications for her multiple health conditions.   Objective:   Blood pressure 99/67, pulse 81, temperature 97.9 F (36.6 C), height 5' 5"  (1.651 m), weight 258 lb (117 kg), SpO2 99 %. Body mass index is 42.93 kg/m.  General: Cooperative, alert, well developed, in no acute distress. HEENT: Conjunctivae and lids unremarkable. Cardiovascular: Regular rhythm.  Lungs: Normal work of breathing. Neurologic: No focal deficits.   Lab Results  Component Value Date   CREATININE 0.94 09/09/2019   BUN 14 09/09/2019   NA 141 09/09/2019   K 3.8 09/09/2019   CL 103 09/09/2019   CO2 27 09/09/2019   Lab Results  Component Value Date   ALT 18 09/09/2019   AST 18 09/09/2019   ALKPHOS 143 (H) 09/09/2019   BILITOT 0.5 09/09/2019   Lab Results  Component Value Date   HGBA1C 6.5 (H) 09/09/2019   Lab Results  Component Value Date   INSULIN 15.4 09/09/2019   Lab Results  Component Value Date   TSH 0.688 09/09/2019   Lab Results  Component Value Date   CHOL 194 09/09/2019   HDL 52 09/09/2019   LDLCALC 116 (H)  09/09/2019   TRIG 146 09/09/2019   Lab Results  Component Value Date   WBC 6.8 09/09/2019   HGB 12.2 09/09/2019   HCT 37.0 09/09/2019   MCV 91 09/09/2019   PLT 311 09/09/2019   No results found for: IRON, TIBC, FERRITIN  Attestation Statements:   Reviewed by clinician on day of visit: allergies, medications, problem list, medical history, surgical history, family history, social history, and previous encounter notes.  Time spent on visit including pre-visit chart review and post-visit charting and care was 28 minutes.   I, Michaelene Song, am acting as Location manager for [employer], NP-C   I have reviewed the above documentation for accuracy and completeness, and I agree with the above. -  Pratt Bress d. Gari Hartsell, NP-C

## 2020-01-02 ENCOUNTER — Other Ambulatory Visit (INDEPENDENT_AMBULATORY_CARE_PROVIDER_SITE_OTHER): Payer: Self-pay | Admitting: Adult Health

## 2020-01-02 DIAGNOSIS — E559 Vitamin D deficiency, unspecified: Secondary | ICD-10-CM

## 2020-01-07 ENCOUNTER — Ambulatory Visit (INDEPENDENT_AMBULATORY_CARE_PROVIDER_SITE_OTHER): Payer: BC Managed Care – PPO | Admitting: Adult Health

## 2020-01-07 ENCOUNTER — Encounter (INDEPENDENT_AMBULATORY_CARE_PROVIDER_SITE_OTHER): Payer: Self-pay | Admitting: Adult Health

## 2020-01-07 ENCOUNTER — Other Ambulatory Visit: Payer: Self-pay

## 2020-01-07 VITALS — BP 99/65 | HR 84 | Temp 98.4°F | Ht 65.0 in | Wt 261.0 lb

## 2020-01-07 DIAGNOSIS — Z6841 Body Mass Index (BMI) 40.0 and over, adult: Secondary | ICD-10-CM

## 2020-01-07 DIAGNOSIS — E1159 Type 2 diabetes mellitus with other circulatory complications: Secondary | ICD-10-CM | POA: Diagnosis not present

## 2020-01-07 DIAGNOSIS — E559 Vitamin D deficiency, unspecified: Secondary | ICD-10-CM | POA: Diagnosis not present

## 2020-01-07 DIAGNOSIS — I152 Hypertension secondary to endocrine disorders: Secondary | ICD-10-CM

## 2020-01-07 DIAGNOSIS — E1169 Type 2 diabetes mellitus with other specified complication: Secondary | ICD-10-CM | POA: Diagnosis not present

## 2020-01-07 DIAGNOSIS — E785 Hyperlipidemia, unspecified: Secondary | ICD-10-CM

## 2020-01-07 NOTE — Progress Notes (Signed)
Chief Complaint:   OBESITY Vanessa Dean is here to discuss her progress with her obesity treatment plan along with follow-up of her obesity related diagnoses. Vanessa Dean is on the Category 1 Plan and states she is following her eating plan approximately 50% of the time. Vanessa Dean states she is doing PT 60 minutes 1 time per week.  Today's visit was #: 8 Starting weight: 274 lbs Starting date: 09/09/2019 Today's weight: 261 lbs Today's date: 01/07/2020 Total lbs lost to date: 13 Total lbs lost since last in-office visit: 0  Interim History: Over the last 2 weeks, Vanessa Dean has attended 3 events and did not eat on plan as consistently as she normally does. She also feels that she has not been hydrating with water as frequently as she has been.  Subjective:   Hypertension associated with diabetes (Vanessa Dean). Blood pressure is soft on today's office visit. Ambulatory blood pressure readings - systolic blood pressure 65-993; diastolic blood pressure 57-01; SBP mean 110; DBP mean 70. Vanessa Dean denies symptoms of hypotension. Her PCP recently reduced her Diovan to 160/12.5 mg QD.  BP Readings from Last 3 Encounters:  01/07/20 99/65  12/23/19 99/67  12/04/19 124/78   Lab Results  Component Value Date   CREATININE 0.94 09/09/2019   CREATININE 1.06 (H) 06/30/2019   CREATININE 1.06 (H) 05/09/2019   Type 2 diabetes mellitus with other specified complication, without long-term current use of insulin (Vanessa Dean). 09/09/2019 blood glucose 108, A1c 6.5 with an insulin level of 15.4. Dr. Gevena Dean- ran A1c on 11/21/2019 on was 6.2. Vanessa Dean is not on any antidiabetic prescription medication and diabetes is diet controlled.   Lab Results  Component Value Date   HGBA1C 6.5 (H) 09/09/2019   Lab Results  Component Value Date   LDLCALC 116 (H) 09/09/2019   CREATININE 0.94 09/09/2019   Lab Results  Component Value Date   INSULIN 15.4 09/09/2019   Hyperlipidemia associated with type 2 diabetes mellitus (Vanessa Dean).  09/09/2019 lipid panel showed LDL 116; 11/21/2019  lipid panel with PCP on 11/21/19- showed LDL at goal at 67.   Lab Results  Component Value Date   CHOL 194 09/09/2019   HDL 52 09/09/2019   LDLCALC 116 (H) 09/09/2019   TRIG 146 09/09/2019   Lab Results  Component Value Date   ALT 18 09/09/2019   AST 18 09/09/2019   ALKPHOS 143 (H) 09/09/2019   BILITOT 0.5 09/09/2019   The 10-year ASCVD risk score Vanessa Dean DC Jr., et al., 2013) is: 3.7%   Values used to calculate the score:     Age: 54 years     Sex: Female     Is Non-Hispanic African American: Yes     Diabetic: Yes     Tobacco smoker: No     Systolic Blood Pressure: 99 mmHg     Is BP treated: Yes     HDL Cholesterol: 52 mg/dL     Total Cholesterol: 194 mg/dL  Vitamin D deficiency. Vitamin D level on 09/09/2019 was 30.9, below goal of 50; 11/21/2019 Vitamin D level with Vanessa Dean, PCP, was greater than 50. Vanessa Dean was instructed to complete Ergocalciferol and then convert to OTC Vitamin D3 2,000 units QD.   Ref. Range 09/09/2019 15:53  Vitamin D, 25-Hydroxy Latest Ref Range: 30.0 - 100.0 ng/mL 30.9   Assessment/Plan:   Hypertension associated with diabetes (Vanessa Dean). Vanessa Dean is working on healthy weight loss and exercise to improve blood pressure control. We will watch for signs of hypotension as  she continues her lifestyle modifications. Vanessa Dean will continue to monitor blood pressure/heart rate at home and will bring log to her next office visit. Will monitor for symptoms of hypotension. If systolic blood pressure less than 100 at her next office visit, will consider further reducing her Diovan dosage.  Type 2 diabetes mellitus with other specified complication, without long-term current use of insulin (Vanessa Dean). Good blood sugar control is important to decrease the likelihood of diabetic complications such as nephropathy, neuropathy, limb loss, blindness, coronary artery disease, and death. Intensive lifestyle modification including diet,  exercise and weight loss are the first line of treatment for diabetes. Vanessa Dean will continue to increase her protein intake and limit her intake of simple carbohydrates.  Hyperlipidemia associated with type 2 diabetes mellitus (Vanessa Dean). Cardiovascular risk and specific lipid/LDL goals reviewed.  We discussed several lifestyle modifications today and Vanessa Dean will continue to work on diet, exercise and weight loss efforts. Orders and follow up as documented in patient record. Vanessa Dean will continue atorvastatin 10 mg daily as directed.   Counseling Intensive lifestyle modifications are the first line treatment for this issue. . Dietary changes: Increase soluble fiber. Decrease simple carbohydrates. . Exercise changes: Moderate to vigorous-intensity aerobic activity 150 minutes per week if tolerated. . Lipid-lowering medications: see documented in medical record.  Vitamin D deficiency. Low Vitamin D level contributes to fatigue and are associated with obesity, breast, and colon cancer. She will complete Ergocalciferol as directed and then convert to OTC Vitamin D3 2,000 IU once weekly. She will follow-up for routine testing of Vitamin D, at least 2-3 times per year to avoid over-replacement.  Class 3 severe obesity with serious comorbidity and body mass index (BMI) of 40.0 to 44.9 in adult, unspecified obesity type (Vanessa Dean).  Vanessa Dean is currently in the action stage of change. As such, her goal is to continue with weight loss efforts. She has agreed to the Category 1 Plan.   Exercise goals: Vanessa Dean will continue PT 60 minutes once a week.  Behavioral modification strategies: increasing lean protein intake, increasing water intake, meal planning and cooking strategies, keeping healthy foods in the home and planning for success.  Vanessa Dean has agreed to follow-up with our clinic in 2 weeks. She was informed of the importance of frequent follow-up visits to maximize her success with intensive lifestyle modifications for  her multiple health conditions.   Objective:   Blood pressure 99/65, pulse 84, temperature 98.4 F (36.9 C), height 5' 5"  (1.651 m), weight 261 lb (118.4 kg), SpO2 97 %. Body mass index is 43.43 kg/m.  General: Cooperative, alert, well developed, in no acute distress. HEENT: Conjunctivae and lids unremarkable. Cardiovascular: Regular rhythm.  Lungs: Normal work of breathing. Neurologic: No focal deficits.   Lab Results  Component Value Date   CREATININE 0.94 09/09/2019   BUN 14 09/09/2019   NA 141 09/09/2019   K 3.8 09/09/2019   CL 103 09/09/2019   CO2 27 09/09/2019   Lab Results  Component Value Date   ALT 18 09/09/2019   AST 18 09/09/2019   ALKPHOS 143 (H) 09/09/2019   BILITOT 0.5 09/09/2019   Lab Results  Component Value Date   HGBA1C 6.5 (H) 09/09/2019   Lab Results  Component Value Date   INSULIN 15.4 09/09/2019   Lab Results  Component Value Date   TSH 0.688 09/09/2019   Lab Results  Component Value Date   CHOL 194 09/09/2019   HDL 52 09/09/2019   LDLCALC 116 (H) 09/09/2019   TRIG  146 09/09/2019   Lab Results  Component Value Date   WBC 6.8 09/09/2019   HGB 12.2 09/09/2019   HCT 37.0 09/09/2019   MCV 91 09/09/2019   PLT 311 09/09/2019   No results found for: IRON, TIBC, FERRITIN  Attestation Statements:   Reviewed by clinician on day of visit: allergies, medications, problem list, medical history, surgical history, family history, social history, and previous encounter notes.  Time spent on visit including pre-visit chart review and post-visit charting and care was 35 minutes.   I, Michaelene Song, am acting as Location manager for [employer], NP-C   I have reviewed the above documentation for accuracy and completeness, and I agree with the above. - Katy d. Danford, NP-C

## 2020-01-22 ENCOUNTER — Ambulatory Visit (INDEPENDENT_AMBULATORY_CARE_PROVIDER_SITE_OTHER): Payer: BC Managed Care – PPO | Admitting: Adult Health

## 2020-01-22 ENCOUNTER — Other Ambulatory Visit: Payer: Self-pay

## 2020-01-22 ENCOUNTER — Encounter (INDEPENDENT_AMBULATORY_CARE_PROVIDER_SITE_OTHER): Payer: Self-pay | Admitting: Adult Health

## 2020-01-22 VITALS — BP 109/71 | HR 71 | Temp 98.0°F | Ht 65.0 in | Wt 259.0 lb

## 2020-01-22 DIAGNOSIS — Z9189 Other specified personal risk factors, not elsewhere classified: Secondary | ICD-10-CM | POA: Diagnosis not present

## 2020-01-22 DIAGNOSIS — E1159 Type 2 diabetes mellitus with other circulatory complications: Secondary | ICD-10-CM

## 2020-01-22 DIAGNOSIS — Z6841 Body Mass Index (BMI) 40.0 and over, adult: Secondary | ICD-10-CM

## 2020-01-22 DIAGNOSIS — E1169 Type 2 diabetes mellitus with other specified complication: Secondary | ICD-10-CM

## 2020-01-22 DIAGNOSIS — I152 Hypertension secondary to endocrine disorders: Secondary | ICD-10-CM | POA: Diagnosis not present

## 2020-01-22 MED ORDER — VALSARTAN-HYDROCHLOROTHIAZIDE 80-12.5 MG PO TABS: 1.0000 | ORAL_TABLET | Freq: Every day | ORAL | 0 refills | Status: DC

## 2020-01-24 ENCOUNTER — Other Ambulatory Visit: Payer: Self-pay

## 2020-01-24 DIAGNOSIS — Z20822 Contact with and (suspected) exposure to covid-19: Secondary | ICD-10-CM

## 2020-01-26 LAB — SARS-COV-2, NAA 2 DAY TAT

## 2020-01-26 LAB — NOVEL CORONAVIRUS, NAA: SARS-CoV-2, NAA: NOT DETECTED

## 2020-01-26 LAB — SPECIMEN STATUS REPORT

## 2020-01-26 NOTE — Progress Notes (Signed)
Chief Complaint:   OBESITY Vanessa Dean is here to discuss her progress with her obesity treatment plan along with follow-up of her obesity related diagnoses. Vanessa Dean is on the Category 1 Plan and states she is following her eating plan approximately 65-70% of the time. Vanessa Dean states she is exercising 0 minutes 0 times per week.  Today's visit was #: 9 Starting weight: 274 lbs Starting date: 09/09/2019 Today's weight: 259 lbs Today's date: 01/22/2020 Total lbs lost to date: 15 Total lbs lost since last in-office visit: 2  Interim History: Vanessa Dean reports increased fatigue and slight dizziness with position changes. Her ambulatory blood pressure is a little soft. She continues to enjoy the foods and structure of the Category 1 meal plan and has been working on packing lunch/snacks when eating during the workday.  Subjective:   Hypertension associated with diabetes ([place]). Ambulatory systolic blood pressure 026-378'H; diastolic blood pressure 88-50 with one low reading of 91/63 yesterday. Her PCP decreased valsartan/HCTZ to 160/12.5 mg daily. She reports increased fatigue and dizziness with position changes.  BP Readings from Last 3 Encounters:  01/22/20 109/71  01/07/20 99/65  12/23/19 99/67   Lab Results  Component Value Date   CREATININE 0.94 09/09/2019   CREATININE 1.06 (H) 06/30/2019   CREATININE 1.06 (H) 05/09/2019   Type 2 diabetes mellitus with other specified complication, without long-term current use of insulin ([place]). Ambulatory fasting blood glucose 100-105. Vanessa Dean denies symptoms of hypoglycemia.   Lab Results  Component Value Date   HGBA1C 6.5 (H) 09/09/2019   Lab Results  Component Value Date   LDLCALC 116 (H) 09/09/2019   CREATININE 0.94 09/09/2019   Lab Results  Component Value Date   INSULIN 15.4 09/09/2019   At risk for complication associated with hypotension. The patient is at a higher than average risk of hypotension due to steady weight loss and  also on an antihypertensive.  Assessment/Plan:   Hypertension associated with diabetes ([place]). Vanessa Dean is working on healthy weight loss and exercise to improve blood pressure control. We will watch for signs of hypotension as she continues her lifestyle modifications. Vanessa Dean will decrease valsartan/HCTZ to 80/12.5 mg daily #30 with 0 refills and increase her water intake. She will check her blood pressure and heart rate daily and if symptoms develop will bring log to her follow-up appointment.  Type 2 diabetes mellitus with other specified complication, without long-term current use of insulin ([place]). Good blood sugar control is important to decrease the likelihood of diabetic complications such as nephropathy, neuropathy, limb loss, blindness, coronary artery disease, and death. Intensive lifestyle modification including diet, exercise and weight loss are the first line of treatment for diabetes. Vanessa Dean will continue to follow the Category 1 meal plan and monitor blood glucose levels.  At risk for complication associated with hypotension. Vanessa Dean was given approximately 15 minutes of education and counseling today to help avoid hypotension. We discussed risks of hypotension with weight loss and signs of hypotension such as feeling lightheaded or unsteady.  Repetitive spaced learning was employed today to elicit superior memory formation and behavioral change.  Class 3 severe obesity with serious comorbidity and body mass index (BMI) of 40.0 to 44.9 in adult, unspecified obesity type ([place]).  Vanessa Dean is currently in the action stage of change. As such, her goal is to continue with weight loss efforts. She has agreed to the Category 1 Plan.   Handouts were provided on Thanksgiving and Mini Pumpkin Muffins.  Exercise goals: Vanessa Dean will  be active as much as possible.  Behavioral modification strategies: increasing lean protein intake, decreasing simple carbohydrates, no skipping meals, meal planning and  cooking strategies and planning for success.  Vanessa Dean has agreed to follow-up with our clinic in 2 weeks. She was informed of the importance of frequent follow-up visits to maximize her success with intensive lifestyle modifications for her multiple health conditions.   Objective:   Blood pressure 109/71, pulse 71, temperature 98 F (36.7 C), height 5' 5"  (1.651 m), weight 259 lb (117.5 kg), SpO2 97 %. Body mass index is 43.1 kg/m.  General: Cooperative, alert, well developed, in no acute distress. HEENT: Conjunctivae and lids unremarkable. Cardiovascular: Regular rhythm.  Lungs: Normal work of breathing. Neurologic: No focal deficits.   Lab Results  Component Value Date   CREATININE 0.94 09/09/2019   BUN 14 09/09/2019   NA 141 09/09/2019   K 3.8 09/09/2019   CL 103 09/09/2019   CO2 27 09/09/2019   Lab Results  Component Value Date   ALT 18 09/09/2019   AST 18 09/09/2019   ALKPHOS 143 (H) 09/09/2019   BILITOT 0.5 09/09/2019   Lab Results  Component Value Date   HGBA1C 6.5 (H) 09/09/2019   Lab Results  Component Value Date   INSULIN 15.4 09/09/2019   Lab Results  Component Value Date   TSH 0.688 09/09/2019   Lab Results  Component Value Date   CHOL 194 09/09/2019   HDL 52 09/09/2019   LDLCALC 116 (H) 09/09/2019   TRIG 146 09/09/2019   Lab Results  Component Value Date   WBC 6.8 09/09/2019   HGB 12.2 09/09/2019   HCT 37.0 09/09/2019   MCV 91 09/09/2019   PLT 311 09/09/2019   No results found for: IRON, TIBC, FERRITIN  Attestation Statements:   Reviewed by clinician on day of visit: allergies, medications, problem list, medical history, surgical history, family history, social history, and previous encounter notes.  I, Michaelene Song, am acting as Location manager for [employer], NP-C   I have reviewed the above documentation for accuracy and completeness, and I agree with the above. -  Katy d. Danford, NP-C

## 2020-02-09 ENCOUNTER — Ambulatory Visit (INDEPENDENT_AMBULATORY_CARE_PROVIDER_SITE_OTHER): Payer: BC Managed Care – PPO | Admitting: Family Medicine

## 2020-02-09 ENCOUNTER — Other Ambulatory Visit: Payer: Self-pay

## 2020-02-09 ENCOUNTER — Encounter (INDEPENDENT_AMBULATORY_CARE_PROVIDER_SITE_OTHER): Payer: Self-pay | Admitting: Family Medicine

## 2020-02-09 VITALS — BP 94/63 | HR 72 | Temp 98.1°F | Ht 65.0 in | Wt 258.0 lb

## 2020-02-09 DIAGNOSIS — Z9189 Other specified personal risk factors, not elsewhere classified: Secondary | ICD-10-CM | POA: Diagnosis not present

## 2020-02-09 DIAGNOSIS — I152 Hypertension secondary to endocrine disorders: Secondary | ICD-10-CM | POA: Diagnosis not present

## 2020-02-09 DIAGNOSIS — E1159 Type 2 diabetes mellitus with other circulatory complications: Secondary | ICD-10-CM

## 2020-02-09 DIAGNOSIS — E1169 Type 2 diabetes mellitus with other specified complication: Secondary | ICD-10-CM

## 2020-02-09 DIAGNOSIS — Z6841 Body Mass Index (BMI) 40.0 and over, adult: Secondary | ICD-10-CM

## 2020-02-09 MED ORDER — VALSARTAN 40 MG PO TABS: 40.0000 mg | ORAL_TABLET | Freq: Every day | ORAL | 0 refills | Status: DC

## 2020-02-10 NOTE — Progress Notes (Signed)
Chief Complaint:   OBESITY Vanessa Dean is here to discuss her progress with her obesity treatment plan along with follow-up of her obesity related diagnoses. Vanessa Dean is on the Category 1 Plan and states she is following her eating plan approximately 60% of the time. Vanessa Dean states she is doing physical therapy and walking for 2 and a half hours 7 times per week.  Today's visit was #: 10 Starting weight: 274 lbs Starting date: 09/09/2019 Today's weight: 258 lbs Today's date: 02/09/2020 Total lbs lost to date: 16 Total lbs lost since last in-office visit: 1  Interim History: Vanessa Dean is surprised with 1 lbs weight loss. She feels her water intake is down. She is sometimes eating extra calories in the form of meat (most of the time). She notes hunger between meals at times.  Subjective:   1. Hypertension associated with diabetes (Vanessa Dean) Vanessa Dean's blood press is low at 94/63, and ranges at home 91-116/53-74. She has had a bit of dizziness and feeling "off" recently. She is on Diovan-HCT 80-12.5 mg (decreased from 160-12.5 mg) at last OV. BP Readings from Last 3 Encounters:  02/09/20 94/63  01/22/20 109/71  01/07/20 99/65     2. Type 2 diabetes mellitus with other specified complication, without long-term current use of insulin ([place]) Vanessa Dean's diabetes mellitus is diet controlled. Last A1c was 6.5. She does not want medications, though we discussed metformin and Ozempic briefly for hunger.  3. At risk for complication associated with hypotension The patient is at a higher than average risk of hypotension due to anti-hypertensives, weight loss, and decreased water intake.  Assessment/Plan:   1. Hypertension associated with diabetes ([place]) Vanessa Dean agreed to start Diovan 40 mg q AM #30 with no refills. She will discontinue valsartan-HCTZ and increase her water intake.   2. Type 2 diabetes mellitus with other specified complication, without long-term current use of insulin ([facility])  Vanessa Dean will continue her  meal plan, and will continue to follow up as directed.  3. At risk for complication associated with hypotension Vanessa Dean was given approximately 15 minutes of education and counseling today to help avoid hypotension. We discussed risks of hypotension with weight loss and signs of hypotension such as feeling lightheaded or unsteady.  Repetitive spaced learning was employed today to elicit superior memory formation and behavioral change.  4. Class 3 severe obesity with serious comorbidity and body mass index (BMI) of 40.0 to 44.9 in adult, unspecified obesity type ([facility]) [place] is currently in the action stage of change. As such, her goal is to continue with weight loss efforts. She has agreed to the Category 1 Plan + 100 calories.  Encouraged her to use 100 added calories for protein to promote satiety.  Exercise goals: As is.  Behavioral modification strategies: increasing lean protein intake and better snacking choices.  Dazja has agreed to follow-up with our clinic in 2 weeks with Mina Marble, NP.   Objective:   Blood pressure 94/63, pulse 72, temperature 98.1 F (36.7 C), height 5' 5"  (1.651 m), weight 258 lb (117 kg), SpO2 96 %. Body mass index is 42.93 kg/m.  General: Cooperative, alert, well developed, in no acute distress. HEENT: Conjunctivae and lids unremarkable. Cardiovascular: Regular rhythm.  Lungs: Normal work of breathing. Neurologic: No focal deficits.   Lab Results  Component Value Date   CREATININE 0.94 09/09/2019   BUN 14 09/09/2019   NA 141 09/09/2019   K 3.8 09/09/2019   CL 103 09/09/2019   CO2 27 09/09/2019  Lab Results  Component Value Date   ALT 18 09/09/2019   AST 18 09/09/2019   ALKPHOS 143 (H) 09/09/2019   BILITOT 0.5 09/09/2019   Lab Results  Component Value Date   HGBA1C 6.5 (H) 09/09/2019   Lab Results  Component Value Date   INSULIN 15.4 09/09/2019   Lab Results  Component Value Date   TSH 0.688 09/09/2019   Lab Results  Component  Value Date   CHOL 194 09/09/2019   HDL 52 09/09/2019   LDLCALC 116 (H) 09/09/2019   TRIG 146 09/09/2019   Lab Results  Component Value Date   WBC 6.8 09/09/2019   HGB 12.2 09/09/2019   HCT 37.0 09/09/2019   MCV 91 09/09/2019   PLT 311 09/09/2019   No results found for: IRON, TIBC, FERRITIN  Attestation Statements:   Reviewed by clinician on day of visit: allergies, medications, problem list, medical history, surgical history, family history, social history, and previous encounter notes.   Wilhemena Durie, am acting as Location manager for [employer], FNP-C.  I have reviewed the above documentation for accuracy and completeness, and I agree with the above. -  Georgianne Fick, FNP

## 2020-02-11 ENCOUNTER — Encounter (INDEPENDENT_AMBULATORY_CARE_PROVIDER_SITE_OTHER): Payer: Self-pay | Admitting: Family Medicine

## 2020-02-13 ENCOUNTER — Other Ambulatory Visit (INDEPENDENT_AMBULATORY_CARE_PROVIDER_SITE_OTHER): Payer: Self-pay | Admitting: Adult Health

## 2020-02-20 ENCOUNTER — Ambulatory Visit: Payer: BC Managed Care – PPO | Attending: Internal Medicine

## 2020-02-20 DIAGNOSIS — Z23 Encounter for immunization: Secondary | ICD-10-CM

## 2020-02-20 NOTE — Progress Notes (Signed)
   Covid-19 Vaccination Clinic  Name:  MALAINA MORTELLARO    MRN: [id number] DOB: 1965/07/07  02/20/2020  Ms. Pike was observed post Covid-19 immunization for 15 minutes without incident. She was provided with Vaccine Information Sheet and instruction to access the V-Safe system.   Ms. Ivancic was instructed to call 911 with any severe reactions post vaccine: Marland Kitchen Difficulty breathing  . Swelling of face and throat  . A fast heartbeat  . A bad rash all over body  . Dizziness and weakness   Immunizations Administered    Name Date Dose VIS Date Route   Pfizer COVID-19 Vaccine 02/20/2020  3:51 PM 0.3 mL 12/24/2019 Intramuscular   Manufacturer: [place]   Lot: [id number]   [place] Part: [id number]

## 2020-02-24 ENCOUNTER — Other Ambulatory Visit: Payer: Self-pay

## 2020-02-24 ENCOUNTER — Encounter (INDEPENDENT_AMBULATORY_CARE_PROVIDER_SITE_OTHER): Payer: Self-pay | Admitting: Adult Health

## 2020-02-24 ENCOUNTER — Ambulatory Visit (INDEPENDENT_AMBULATORY_CARE_PROVIDER_SITE_OTHER): Payer: BC Managed Care – PPO | Admitting: Adult Health

## 2020-02-24 VITALS — BP 113/72 | HR 70 | Temp 98.0°F | Ht 65.0 in | Wt 256.0 lb

## 2020-02-24 DIAGNOSIS — I10 Essential (primary) hypertension: Secondary | ICD-10-CM | POA: Diagnosis not present

## 2020-02-24 DIAGNOSIS — E7849 Other hyperlipidemia: Secondary | ICD-10-CM

## 2020-02-24 DIAGNOSIS — E1169 Type 2 diabetes mellitus with other specified complication: Secondary | ICD-10-CM

## 2020-02-24 DIAGNOSIS — Z6841 Body Mass Index (BMI) 40.0 and over, adult: Secondary | ICD-10-CM

## 2020-02-24 MED ORDER — HYDROCHLOROTHIAZIDE 12.5 MG PO TABS: 12.5000 mg | ORAL_TABLET | Freq: Every day | ORAL | 0 refills | Status: DC

## 2020-02-24 NOTE — Progress Notes (Signed)
Chief Complaint:   OBESITY Vanessa Dean is here to discuss her progress with her obesity treatment plan along with follow-up of her obesity related diagnoses. Vanessa Dean is on the Category 1 Plan + 100 calories and states she is following her eating plan approximately 60-70% of the time. Vanessa Dean states she is doing PT 60 minutes 1 time per week and walking some.   Today's visit was #: 11 Starting weight: 274 lbs Starting date: 09/09/2019 Today's weight: 256 lbs Today's date: 02/24/2020 Total lbs lost to date: 18 Total lbs lost since last in-office visit: 2  Interim History: Vanessa Dean's valsartan/HCTZ was changed to plain valsartan at her last office visit due to soft BP and reports of occasional dizziness. She has not started the new plain valsartan and reports resolution of dizziness.. She is concerned about increased edema with new antihypertensive. She denies acute cardiac symptoms.  Subjective:   Essential (primary) hypertension. Ambulatory systolic blood pressure [id number] with diastolic blood pressure 62-56. Vanessa Dean's antihypertensive regimen was changed at her last office visit - valsartan/HCTZ 80/12.5 mg was discontinued and she was started on plain Diovan 40 mg daily. She has been taking valsartan/HCTZ 80/12.5 mg and has not started plain 40 mg daily. She denies chest pain, palpitations, or dizziness with position changes.  BP Readings from Last 3 Encounters:  02/24/20 113/72  02/09/20 94/63  01/22/20 109/71   Lab Results  Component Value Date   CREATININE 0.94 09/09/2019   CREATININE 1.06 (H) 06/30/2019   CREATININE 1.06 (H) 05/09/2019   Other hyperlipidemia. Vanessa Dean is on atorvastatin 10 mg every evening and denies myalgias. LDL was above goal at last lipid check check on 09/09/2019.  Lab Results  Component Value Date   CHOL 194 09/09/2019   HDL 52 09/09/2019   LDLCALC 116 (H) 09/09/2019   TRIG 146 09/09/2019   Lab Results  Component Value Date   ALT 18 09/09/2019    AST 18 09/09/2019   ALKPHOS 143 (H) 09/09/2019   BILITOT 0.5 09/09/2019   The 10-year ASCVD risk score [name], et al., 2013) is: 6.6%   Values used to calculate the score:     Age: 54 years     Sex: Female     Is Non-Hispanic African American: Yes     Diabetic: Yes     Tobacco smoker: No     Systolic Blood Pressure: 389 mmHg     Is BP treated: Yes     HDL Cholesterol: 52 mg/dL     Total Cholesterol: 194 mg/dL  Type 2 diabetes mellitus with other specified complication, without long-term current use of insulin ([place]). A1c on 09/09/2019 was 6.5, diet controlled and not on antidiabetic prescription medication. Vanessa Dean is on valsartan and atorvastatin.  Lab Results  Component Value Date   HGBA1C 6.5 (H) 09/09/2019   Lab Results  Component Value Date   LDLCALC 116 (H) 09/09/2019   CREATININE 0.94 09/09/2019   Lab Results  Component Value Date   INSULIN 15.4 09/09/2019   Assessment/Plan:   Essential (primary) hypertension. Vanessa Dean is working on healthy weight loss and exercise to improve blood pressure control. We will watch for signs of hypotension as she continues her lifestyle modifications.  Stop valsartan/HCTZ 80/12.5 mg.  Start Diovan 40 mg once daily.  If edema is noticed, it is okay to use HCTZ 12.5 mg PRN; check blood pressure prior to use, may take on dose per day if systolic blood pressure greater than 100.  Remain  well hydrated.  Check blood pressure and heart rate at home and bring log to her next office visit.  Fasting labs will be checked at her next office visit.  Other hyperlipidemia. Cardiovascular risk and specific lipid/LDL goals reviewed.  We discussed several lifestyle modifications today and Vanessa Dean will continue to work on diet, exercise and weight loss efforts. Orders and follow up as documented in patient record. Vanessa Dean will remain well hydrated. Labs will be checked at her next office visit.  Counseling Intensive lifestyle modifications are the  first line treatment for this issue. . Dietary changes: Increase soluble fiber. Decrease simple carbohydrates. . Exercise changes: Moderate to vigorous-intensity aerobic activity 150 minutes per week if tolerated.  . Lipid-lowering medications: see documented in medical record.  Type 2 diabetes mellitus with other specified complication, without long-term current use of insulin ([place]). Good blood sugar control is important to decrease the likelihood of diabetic complications such as nephropathy, neuropathy, limb loss, blindness, coronary artery disease, and death. Intensive lifestyle modification including diet, exercise and weight loss are the first line of treatment for diabetes. Labs will be checked at her next office visit.  Class 3 severe obesity with serious comorbidity and body mass index (BMI) of 40.0 to 44.9 in adult, unspecified obesity type ([place]).  Vanessa Dean is currently in the action stage of change. As such, her goal is to continue with weight loss efforts. She has agreed to the Category 1 Plan + 100 calories.   Handouts were provided on Holiday Strategies and Holiday Recipes.  Exercise goals: Vanessa Dean will continue PT 60 minutes 1 time per week and walking some.  Behavioral modification strategies: increasing lean protein intake, increasing water intake, meal planning and cooking strategies, holiday eating strategies , celebration eating strategies and planning for success.  Vanessa Dean has agreed to follow-up with our clinic fasting in 3 weeks. She was informed of the importance of frequent follow-up visits to maximize her success with intensive lifestyle modifications for her multiple health conditions.   Objective:   Blood pressure 113/72, pulse 70, temperature 98 F (36.7 C), height 5' 5"  (1.651 m), weight 256 lb (116.1 kg), SpO2 97 %. Body mass index is 42.6 kg/m.  General: Cooperative, alert, well developed, in no acute distress. HEENT: Conjunctivae and lids  unremarkable. Cardiovascular: Regular rhythm.  Lungs: Normal work of breathing. Neurologic: No focal deficits.   Lab Results  Component Value Date   CREATININE 0.94 09/09/2019   BUN 14 09/09/2019   NA 141 09/09/2019   K 3.8 09/09/2019   CL 103 09/09/2019   CO2 27 09/09/2019   Lab Results  Component Value Date   ALT 18 09/09/2019   AST 18 09/09/2019   ALKPHOS 143 (H) 09/09/2019   BILITOT 0.5 09/09/2019   Lab Results  Component Value Date   HGBA1C 6.5 (H) 09/09/2019   Lab Results  Component Value Date   INSULIN 15.4 09/09/2019   Lab Results  Component Value Date   TSH 0.688 09/09/2019   Lab Results  Component Value Date   CHOL 194 09/09/2019   HDL 52 09/09/2019   LDLCALC 116 (H) 09/09/2019   TRIG 146 09/09/2019   Lab Results  Component Value Date   WBC 6.8 09/09/2019   HGB 12.2 09/09/2019   HCT 37.0 09/09/2019   MCV 91 09/09/2019   PLT 311 09/09/2019   No results found for: IRON, TIBC, FERRITIN  Attestation Statements:   Reviewed by clinician on day of visit: allergies, medications, problem list, medical history,  surgical history, family history, social history, and previous encounter notes.  Time spent on visit including pre-visit chart review and post-visit charting and care was 35 minutes.   I, Michaelene Song, am acting as Location manager for [employer], NP-C   I have reviewed the above documentation for accuracy and completeness, and I agree with the above. -  Alondra Sahni d. Kaiden Pech, NP-C

## 2020-03-03 ENCOUNTER — Other Ambulatory Visit (INDEPENDENT_AMBULATORY_CARE_PROVIDER_SITE_OTHER): Payer: Self-pay | Admitting: Family Medicine

## 2020-03-03 ENCOUNTER — Other Ambulatory Visit (HOSPITAL_COMMUNITY): Payer: Self-pay | Admitting: Interventional Radiology

## 2020-03-03 DIAGNOSIS — E1159 Type 2 diabetes mellitus with other circulatory complications: Secondary | ICD-10-CM

## 2020-03-03 DIAGNOSIS — I671 Cerebral aneurysm, nonruptured: Secondary | ICD-10-CM

## 2020-03-03 DIAGNOSIS — I152 Hypertension secondary to endocrine disorders: Secondary | ICD-10-CM

## 2020-03-04 ENCOUNTER — Other Ambulatory Visit: Payer: BC Managed Care – PPO

## 2020-03-17 ENCOUNTER — Encounter (INDEPENDENT_AMBULATORY_CARE_PROVIDER_SITE_OTHER): Payer: Self-pay | Admitting: Physician Assistant

## 2020-03-17 ENCOUNTER — Ambulatory Visit (INDEPENDENT_AMBULATORY_CARE_PROVIDER_SITE_OTHER): Payer: BC Managed Care – PPO | Admitting: Physician Assistant

## 2020-03-17 ENCOUNTER — Other Ambulatory Visit: Payer: Self-pay

## 2020-03-17 VITALS — BP 122/73 | HR 83 | Temp 97.8°F | Ht 65.0 in | Wt 257.0 lb

## 2020-03-17 DIAGNOSIS — Z9189 Other specified personal risk factors, not elsewhere classified: Secondary | ICD-10-CM

## 2020-03-17 DIAGNOSIS — Z6841 Body Mass Index (BMI) 40.0 and over, adult: Secondary | ICD-10-CM

## 2020-03-17 DIAGNOSIS — E1169 Type 2 diabetes mellitus with other specified complication: Secondary | ICD-10-CM

## 2020-03-17 DIAGNOSIS — E785 Hyperlipidemia, unspecified: Secondary | ICD-10-CM

## 2020-03-17 DIAGNOSIS — E559 Vitamin D deficiency, unspecified: Secondary | ICD-10-CM

## 2020-03-17 DIAGNOSIS — E7849 Other hyperlipidemia: Secondary | ICD-10-CM | POA: Diagnosis not present

## 2020-03-18 LAB — COMPREHENSIVE METABOLIC PANEL
ALT: 17 IU/L (ref 0–32)
AST: 24 IU/L (ref 0–40)
Albumin/Globulin Ratio: 1.2 (ref 1.2–2.2)
Albumin: 4 g/dL (ref 3.8–4.9)
Alkaline Phosphatase: 108 IU/L (ref 44–121)
BUN/Creatinine Ratio: 16 (ref 9–23)
BUN: 19 mg/dL (ref 6–24)
Bilirubin Total: 0.5 mg/dL (ref 0.0–1.2)
CO2: 27 mmol/L (ref 20–29)
Calcium: 9.7 mg/dL (ref 8.7–10.2)
Chloride: 101 mmol/L (ref 96–106)
Creatinine, Ser: 1.19 mg/dL — ABNORMAL HIGH (ref 0.57–1.00)
GFR calc Af Amer: 60 mL/min/{1.73_m2} (ref >59)
GFR calc non Af Amer: 52 mL/min/{1.73_m2} — ABNORMAL LOW (ref >59)
Globulin, Total: 3.4 g/dL (ref 1.5–4.5)
Glucose: 108 mg/dL — ABNORMAL HIGH (ref 65–99)
Potassium: 3.5 mmol/L (ref 3.5–5.2)
Sodium: 142 mmol/L (ref 134–144)
Total Protein: 7.4 g/dL (ref 6.0–8.5)

## 2020-03-18 LAB — LIPID PANEL
Chol/HDL Ratio: 2.6 ratio (ref 0.0–4.4)
Cholesterol, Total: 155 mg/dL (ref 100–199)
HDL: 59 mg/dL (ref >39)
LDL Chol Calc (NIH): 81 mg/dL (ref 0–99)
Triglycerides: 79 mg/dL (ref 0–149)
VLDL Cholesterol Cal: 15 mg/dL (ref 5–40)

## 2020-03-18 LAB — VITAMIN D 25 HYDROXY (VIT D DEFICIENCY, FRACTURES): Vit D, 25-Hydroxy: 52.2 ng/mL (ref 30.0–100.0)

## 2020-03-18 LAB — HEMOGLOBIN A1C
Est. average glucose Bld gHb Est-mCnc: 120 mg/dL
Hgb A1c MFr Bld: 5.8 % — ABNORMAL HIGH (ref 4.8–5.6)

## 2020-03-18 LAB — INSULIN, RANDOM: INSULIN: 12.3 u[IU]/mL (ref 2.6–24.9)

## 2020-03-18 NOTE — Progress Notes (Signed)
Chief Complaint:   OBESITY Vanessa Dean is here to discuss her progress with her obesity treatment plan along with follow-up of her obesity related diagnoses. Vanessa Dean is on the Category 1 Plan + 100 calories and states she is following her eating plan approximately 55% of the time. Vanessa Dean states she is doing physical therapy for 1 week, and walking for 1 hour and 30 minutes 7 times per week.  Today's visit was #: 12 Starting weight: 274 lbs Starting date: 09/09/2019 Today's weight: 257 lbs Today's date: 03/17/2020 Total lbs lost to date: 17 Total lbs lost since last in-office visit: 0  Interim History: Aniyia reports that her daughter and husband were sick over the holidays with  COVID, and she was not focusing on her plan. She is only eating 2 eggs for breakfast. She is bored with dinner.  Subjective:   1. Other hyperlipidemia with essiental hypertenison Vanessa Dean is on atorvastatin, and she denies chest pain or myalgias. Last LDL was not at goal, and she is due for labs today.  2. Type 2 diabetes mellitus associated with hyperlipidemia ([place]) Vanessa Dean's last A1c was 6.5, and she is not on medications. She denies polyphagia or hypoglycemia. She is due for labs today.  3. Vitamin D deficiency Vanessa Dean is on Vit D OTC 2,000 units daily. Last Vit D level was not at goal.  4. At risk for heart disease Vanessa Dean is at a higher than average risk for cardiovascular disease due to obesity.   Assessment/Plan:   1. Other hyperlipidemia with essiental hypertenison Cardiovascular risk and specific lipid/LDL goals reviewed. We discussed several lifestyle modifications today. We will check labs today. Vanessa Dean will continue her medications and meal plan, and will continue to work on increasing exercise and weight loss efforts. Orders and follow up as documented in patient record.   Counseling Intensive lifestyle modifications are the first line treatment for this issue. . Dietary changes: Increase soluble fiber.  Decrease simple carbohydrates. . Exercise changes: Moderate to vigorous-intensity aerobic activity 150 minutes per week if tolerated. . Lipid-lowering medications: see documented in medical record.  - Lipid panel - Comprehensive metabolic panel  2. Type 2 diabetes mellitus associated with hyperlipidemia ([facility]) Good blood sugar control is important to decrease the likelihood of diabetic complications such as nephropathy, neuropathy, limb loss, blindness, coronary artery disease, and death. Intensive lifestyle modification including diet, exercise and weight loss are the first line of treatment for diabetes. We will check labs today. Vanessa Dean will continue her meal plan, and will follow up as directed.  - Hemoglobin A1c - Insulin, random  3. Vitamin D deficiency Low Vitamin D level contributes to fatigue and are associated with obesity, breast, and colon cancer. We will check labs today. Vanessa Dean agreed continue taking OTC Vitamin D 2,000 IU daily and will follow-up for routine testing of Vitamin D, at least 2-3 times per year to avoid over-replacement.  - VITAMIN D 25 Hydroxy (Vit-D Deficiency, Fractures)  4. At risk for heart disease Vanessa Dean was given approximately 15 minutes of coronary artery disease prevention counseling today. She is 55 y.o. female and has risk factors for heart disease including obesity. We discussed intensive lifestyle modifications today with an emphasis on specific weight loss instructions and strategies.   Repetitive spaced learning was employed today to elicit superior memory formation and behavioral change.  5. Class 3 severe obesity with serious comorbidity and body mass index (BMI) of 40.0 to 44.9 in adult, unspecified obesity type (H[facility]) Vanessa Dean is currently in  the action stage of change. As such, her goal is to continue with weight loss efforts. She has agreed to the Category 1 Plan and keeping a food journal and adhering to recommended goals of 300-400 calories and 35  grams of protein at supper daily.   Exercise goals: As is.  Behavioral modification strategies: increasing lean protein intake and no skipping meals.  Vanessa Dean has agreed to follow-up with our clinic in 2 weeks. She was informed of the importance of frequent follow-up visits to maximize her success with intensive lifestyle modifications for her multiple health conditions.   Vanessa Dean was informed we would discuss her lab results at her next visit unless there is a critical issue that needs to be addressed sooner. Vanessa Dean agreed to keep her next visit at the agreed upon time to discuss these results.  Objective:   Blood pressure 122/73, pulse 83, temperature 97.8 F (36.6 C), height 5' 5"  (1.651 m), weight 257 lb (116.6 kg), SpO2 96 %. Body mass index is 42.77 kg/m.  General: Cooperative, alert, well developed, in no acute distress. HEENT: Conjunctivae and lids unremarkable. Cardiovascular: Regular rhythm.  Lungs: Normal work of breathing. Neurologic: No focal deficits.   Lab Results  Component Value Date   CREATININE 1.19 (H) 03/17/2020   BUN 19 03/17/2020   NA 142 03/17/2020   K 3.5 03/17/2020   CL 101 03/17/2020   CO2 27 03/17/2020   Lab Results  Component Value Date   ALT 17 03/17/2020   AST 24 03/17/2020   ALKPHOS 108 03/17/2020   BILITOT 0.5 03/17/2020   Lab Results  Component Value Date   HGBA1C 5.8 (H) 03/17/2020   HGBA1C 6.5 (H) 09/09/2019   Lab Results  Component Value Date   INSULIN 12.3 03/17/2020   INSULIN 15.4 09/09/2019   Lab Results  Component Value Date   TSH 0.688 09/09/2019   Lab Results  Component Value Date   CHOL 155 03/17/2020   HDL 59 03/17/2020   LDLCALC 81 03/17/2020   TRIG 79 03/17/2020   CHOLHDL 2.6 03/17/2020   Lab Results  Component Value Date   WBC 6.8 09/09/2019   HGB 12.2 09/09/2019   HCT 37.0 09/09/2019   MCV 91 09/09/2019   PLT 311 09/09/2019   No results found for: IRON, TIBC, FERRITIN  Attestation Statements:    Reviewed by clinician on day of visit: allergies, medications, problem list, medical history, surgical history, family history, social history, and previous encounter notes.  Time spent on visit including pre-visit chart review and post-visit care and charting was 30 minutes.    Wilhemena Durie, am acting as transcriptionist for [employer], PA-C.  I have reviewed the above documentation for accuracy and completeness, and I agree with the above. -  [employer]

## 2020-03-28 ENCOUNTER — Other Ambulatory Visit (INDEPENDENT_AMBULATORY_CARE_PROVIDER_SITE_OTHER): Payer: Self-pay | Admitting: Family Medicine

## 2020-03-28 DIAGNOSIS — E1159 Type 2 diabetes mellitus with other circulatory complications: Secondary | ICD-10-CM

## 2020-03-29 NOTE — Telephone Encounter (Signed)
This patient was last seen by Abby Potash, PA-C, and currently has an upcoming appt scheduled on 03/31/20 with her.

## 2020-03-31 ENCOUNTER — Ambulatory Visit (INDEPENDENT_AMBULATORY_CARE_PROVIDER_SITE_OTHER): Payer: BC Managed Care – PPO | Admitting: Physician Assistant

## 2020-04-06 ENCOUNTER — Ambulatory Visit (HOSPITAL_COMMUNITY)
Admission: RE | Admit: 2020-04-06 | Discharge: 2020-04-06 | Disposition: A | Discharge status: Home / Self Care | Payer: BC Managed Care – PPO | Source: Ambulatory Visit | Attending: Interventional Radiology | Admitting: Interventional Radiology

## 2020-04-06 ENCOUNTER — Other Ambulatory Visit: Payer: Self-pay

## 2020-04-06 DIAGNOSIS — I771 Stricture of artery: Secondary | ICD-10-CM | POA: Diagnosis present

## 2020-04-06 DIAGNOSIS — I671 Cerebral aneurysm, nonruptured: Secondary | ICD-10-CM | POA: Insufficient documentation

## 2020-04-06 MED ORDER — GADOBUTROL 1 MMOL/ML IV SOLN
10.0000 mL | Freq: Once | INTRAVENOUS | Status: AC | PRN
  Administered 2020-04-06: 10 mL via INTRAVENOUS

## 2020-04-07 ENCOUNTER — Telehealth (HOSPITAL_COMMUNITY): Payer: Self-pay

## 2020-04-07 NOTE — Telephone Encounter (Signed)
Pt stated that she is having mild sx, nothing new. Agreed to f/u in 6 months since she's having sx. If not sx, will f/u in 1 year. Will call pt to check in 6 months.

## 2020-04-13 ENCOUNTER — Ambulatory Visit (INDEPENDENT_AMBULATORY_CARE_PROVIDER_SITE_OTHER): Payer: BC Managed Care – PPO | Admitting: Physician Assistant

## 2020-04-22 ENCOUNTER — Other Ambulatory Visit (INDEPENDENT_AMBULATORY_CARE_PROVIDER_SITE_OTHER): Payer: Self-pay | Admitting: Physician Assistant

## 2020-04-22 DIAGNOSIS — E1159 Type 2 diabetes mellitus with other circulatory complications: Secondary | ICD-10-CM

## 2020-04-22 DIAGNOSIS — I152 Hypertension secondary to endocrine disorders: Secondary | ICD-10-CM

## 2020-04-22 NOTE — Telephone Encounter (Signed)
Last OV with Linus Orn

## 2020-05-03 ENCOUNTER — Ambulatory Visit (INDEPENDENT_AMBULATORY_CARE_PROVIDER_SITE_OTHER): Payer: BC Managed Care – PPO | Admitting: Family Medicine

## 2020-05-03 ENCOUNTER — Other Ambulatory Visit: Payer: Self-pay

## 2020-05-03 ENCOUNTER — Encounter (INDEPENDENT_AMBULATORY_CARE_PROVIDER_SITE_OTHER): Payer: Self-pay | Admitting: Family Medicine

## 2020-05-03 VITALS — BP 115/68 | HR 71 | Temp 98.2°F | Ht 65.0 in | Wt 257.0 lb

## 2020-05-03 DIAGNOSIS — E1159 Type 2 diabetes mellitus with other circulatory complications: Secondary | ICD-10-CM | POA: Diagnosis not present

## 2020-05-03 DIAGNOSIS — E559 Vitamin D deficiency, unspecified: Secondary | ICD-10-CM

## 2020-05-03 DIAGNOSIS — I152 Hypertension secondary to endocrine disorders: Secondary | ICD-10-CM

## 2020-05-03 DIAGNOSIS — Z6841 Body Mass Index (BMI) 40.0 and over, adult: Secondary | ICD-10-CM

## 2020-05-04 NOTE — Progress Notes (Signed)
Chief Complaint:   OBESITY Kanija is here to discuss her progress with her obesity treatment plan along with follow-up of her obesity related diagnoses. Denai is on the Category 1 Plan and keeping a food journal and adhering to recommended goals of 300-400 calories and 35 grams of protein at supper and states she is following her eating plan approximately 50% of the time. Micheale states she is not exercising regularly at this time.  Today's visit was #: 13 Starting weight: 274 lbs Starting date: 09/09/2019 Today's weight: 257 lbs Today's date: 05/03/2020 Total lbs lost to date: 17 lbs Total lbs lost since last in-office visit: 0  Interim History: Norinne notes having a lot of stress recently.  Her husband is having a total hip replacement on 05-14-2020.  Work is stressful. She notes some intermittent nausea and heartburn.  She has been skipping some meals.    She notes that she is not eating enough vegetables or drinking enough water.   Denies polyphagia.  Subjective:   1. Hypertension associated with type 2 diabetes mellitus ([place]) Well-controlled.  On HCTZ and Diovan.  BP Readings from Last 3 Encounters:  05/03/20 115/68  03/17/20 122/73  02/24/20 113/72   2. Vitamin D deficiency Vitamin D at goal.  She is on OTC vitamin D 2,000 IU daily.  Assessment/Plan:   1. Hypertension associated with type 2 diabetes mellitus ([facility]) Continue all medications.  2. Vitamin D deficiency Discussed labs with patient today.  Continue OTC vitamin D 2,000 IU daily.  3. Class 3 severe obesity with serious comorbidity and body mass index (BMI) of 40.0 to 44.9 in adult, unspecified obesity type ([facility])  Veena is currently in the action stage of change. As such, her goal is to continue with weight loss efforts. She has agreed to the Category 1 Plan with 75-80 grams of protein per day.   May have a protein bar at lunch (>20 grams protein).  Exercise goals: No exercise has been prescribed at this  time.  Behavioral modification strategies: meal planning and cooking strategies.  Jevon has agreed to follow-up with our clinic in 3 weeks. Objective:   Blood pressure 115/68, pulse 71, temperature 98.2 F (36.8 C), height 5' 5"  (1.651 m), weight 257 lb (116.6 kg), SpO2 98 %. Body mass index is 42.77 kg/m.  General: Cooperative, alert, well developed, in no acute distress. HEENT: Conjunctivae and lids unremarkable. Cardiovascular: Regular rhythm.  Lungs: Normal work of breathing. Neurologic: No focal deficits.   Lab Results  Component Value Date   CREATININE 1.19 (H) 03/17/2020   BUN 19 03/17/2020   NA 142 03/17/2020   K 3.5 03/17/2020   CL 101 03/17/2020   CO2 27 03/17/2020   Lab Results  Component Value Date   ALT 17 03/17/2020   AST 24 03/17/2020   ALKPHOS 108 03/17/2020   BILITOT 0.5 03/17/2020   Lab Results  Component Value Date   HGBA1C 5.8 (H) 03/17/2020   HGBA1C 6.5 (H) 09/09/2019   Lab Results  Component Value Date   INSULIN 12.3 03/17/2020   INSULIN 15.4 09/09/2019   Lab Results  Component Value Date   TSH 0.688 09/09/2019   Lab Results  Component Value Date   CHOL 155 03/17/2020   HDL 59 03/17/2020   LDLCALC 81 03/17/2020   TRIG 79 03/17/2020   CHOLHDL 2.6 03/17/2020   Lab Results  Component Value Date   WBC 6.8 09/09/2019   HGB 12.2 09/09/2019   HCT 37.0  09/09/2019   MCV 91 09/09/2019   PLT 311 09/09/2019   Attestation Statements:   Reviewed by clinician on day of visit: allergies, medications, problem list, medical history, surgical history, family history, social history, and previous encounter notes.  I, Water quality scientist, CMA, am acting as Location manager for [employer], [place].  I have reviewed the above documentation for accuracy and completeness, and I agree with the above. -  Georgianne Fick, FNP

## 2020-05-05 ENCOUNTER — Encounter: Payer: Self-pay | Admitting: Plastic Surgery

## 2020-05-16 ENCOUNTER — Other Ambulatory Visit (INDEPENDENT_AMBULATORY_CARE_PROVIDER_SITE_OTHER): Payer: Self-pay | Admitting: Adult Health

## 2020-05-17 ENCOUNTER — Other Ambulatory Visit (INDEPENDENT_AMBULATORY_CARE_PROVIDER_SITE_OTHER): Payer: Self-pay | Admitting: Physician Assistant

## 2020-05-17 ENCOUNTER — Encounter (INDEPENDENT_AMBULATORY_CARE_PROVIDER_SITE_OTHER): Payer: Self-pay | Admitting: Family Medicine

## 2020-05-17 DIAGNOSIS — E1159 Type 2 diabetes mellitus with other circulatory complications: Secondary | ICD-10-CM

## 2020-05-17 NOTE — Telephone Encounter (Signed)
Pt had to reschedule f/u appointment. But will need refill before she returns.

## 2020-05-17 NOTE — Telephone Encounter (Signed)
Last seen by Dawn Whitmire, FNP 

## 2020-05-18 ENCOUNTER — Encounter: Payer: Self-pay | Admitting: Plastic Surgery

## 2020-05-18 ENCOUNTER — Ambulatory Visit (INDEPENDENT_AMBULATORY_CARE_PROVIDER_SITE_OTHER): Payer: BC Managed Care – PPO | Admitting: Plastic Surgery

## 2020-05-18 ENCOUNTER — Other Ambulatory Visit: Payer: Self-pay

## 2020-05-18 VITALS — BP 138/87 | HR 73 | Ht 65.0 in | Wt 258.0 lb

## 2020-05-18 DIAGNOSIS — N62 Hypertrophy of breast: Secondary | ICD-10-CM | POA: Diagnosis not present

## 2020-05-18 NOTE — Progress Notes (Signed)
   Subjective:    Patient ID: Vanessa Dean, female    DOB: 03-Jan-1966, 55 y.o.   MRN: [id number]  The patient is a 55 year old female here for follow-up after undergoing bilateral breast reduction.  She had 12 to 1500 g removed from her breast.  She is overall very pleased.  She has a little bit of dimpling in on the lateral aspect of the left breast.  I think we can improve upon this.  The rest of the breast is nice and soft.  I do not feel any signs of necrosis.  No sign of infection.     Review of Systems  Constitutional: Negative.   HENT: Negative.   Eyes: Negative.   Respiratory: Negative.   Cardiovascular: Negative.   Genitourinary: Negative.   Musculoskeletal: Negative.   Hematological: Negative.   Psychiatric/Behavioral: Negative.        Objective:   Physical Exam Vitals and nursing note reviewed.  Constitutional:      Appearance: Normal appearance.  HENT:     Head: Normocephalic and atraumatic.  Cardiovascular:     Rate and Rhythm: Normal rate.     Pulses: Normal pulses.  Pulmonary:     Effort: Pulmonary effort is normal.  Neurological:     General: No focal deficit present.     Mental Status: She is alert and oriented to person, place, and time.  Psychiatric:        Mood and Affect: Mood normal.        Behavior: Behavior normal.        Thought Content: Thought content normal.       Assessment & Plan:     ICD-10-CM   1. Symptomatic mammary hypertrophy  N62      Plan for revision left breast nipple areola complex.  We should be able to do this in the office. Pictures were obtained of the patient and placed in the chart with the patient's or guardian's permission.

## 2020-05-25 ENCOUNTER — Ambulatory Visit (INDEPENDENT_AMBULATORY_CARE_PROVIDER_SITE_OTHER): Payer: BC Managed Care – PPO | Admitting: Family Medicine

## 2020-06-08 ENCOUNTER — Other Ambulatory Visit: Payer: Self-pay

## 2020-06-08 ENCOUNTER — Ambulatory Visit (INDEPENDENT_AMBULATORY_CARE_PROVIDER_SITE_OTHER): Payer: BC Managed Care – PPO | Admitting: Family Medicine

## 2020-06-08 ENCOUNTER — Encounter (INDEPENDENT_AMBULATORY_CARE_PROVIDER_SITE_OTHER): Payer: Self-pay | Admitting: Family Medicine

## 2020-06-08 ENCOUNTER — Encounter (INDEPENDENT_AMBULATORY_CARE_PROVIDER_SITE_OTHER): Payer: Self-pay

## 2020-06-08 VITALS — BP 119/82 | HR 82 | Temp 98.2°F | Ht 65.0 in | Wt 262.0 lb

## 2020-06-08 DIAGNOSIS — Z6841 Body Mass Index (BMI) 40.0 and over, adult: Secondary | ICD-10-CM

## 2020-06-08 DIAGNOSIS — E1159 Type 2 diabetes mellitus with other circulatory complications: Secondary | ICD-10-CM | POA: Diagnosis not present

## 2020-06-08 DIAGNOSIS — Z9189 Other specified personal risk factors, not elsewhere classified: Secondary | ICD-10-CM

## 2020-06-08 DIAGNOSIS — I152 Hypertension secondary to endocrine disorders: Secondary | ICD-10-CM

## 2020-06-08 DIAGNOSIS — E65 Localized adiposity: Secondary | ICD-10-CM | POA: Insufficient documentation

## 2020-06-08 MED ORDER — WEGOVY 0.25 MG/0.5ML ~~LOC~~ SOAJ: 0.2500 mg | SUBCUTANEOUS | 0 refills | Status: DC

## 2020-06-10 NOTE — Progress Notes (Signed)
Chief Complaint:   OBESITY Vanessa Dean is here to discuss her progress with her obesity treatment plan along with follow-up of her obesity related diagnoses. Vanessa Dean is on the Category 1 Plan and states she is following her eating plan approximately 45% of the time. Vanessa Dean states she is not exercising regularly.  Today's visit was #: 14 Starting weight: 274 lbs Starting date: 09/09/2019 Today's weight: 262 lbs Today's date: 06/08/2020 Total lbs lost to date: 12 lbs Total lbs lost since last in-office visit: 0  Interim History: Vanessa Dean's husband recently had a total hip replacement.  She reports not being focused on the plan due to stressors such as this.  She notes more eating out.  She was also fasting for religious reasons (no meat).  Her husband has lost 50 pounds since Vanessa Dean has been on this plan.  Subjective:   1. Visceral obesity Visceral obesity rating is 16.  Goal is <12.  2. Hypertension associated with type 2 diabetes mellitus ([place]) Well-controlled with valsartan 40 mg and HCTZ 12.5 mg daily.  BP Readings from Last 3 Encounters:  06/08/20 119/82  05/18/20 138/87  05/03/20 115/68   3. At risk for side effect of medication Vanessa Dean is at risk of side effect of medication due to starting Vanessa Dean.  Assessment/Plan:   1. Visceral obesity New prescription for Wegovy 0.25 mg subcutaneously weekly sent to pharmacy today.  No history of pancreatitis.  She had her gallbladder removed.  No personal or family history of thyroid cancer.  - Start Semaglutide-Weight Management (WEGOVY) 0.25 MG/0.5ML SOAJ; Inject 0.25 mg into the skin once a week.  Dispense: 2 mL; Refill: 0  2. Hypertension associated with type 2 diabetes mellitus (Vanessa Dean) Continue valsartan and HCTZ.  3. At risk for side effect of medication Vanessa Dean was given approximately 15 minutes of drug side effect counseling today.  We discussed side effect possibility and risk versus benefits. Vanessa Dean agreed to the medication and will  contact this office if these side effects are intolerable.  Repetitive spaced learning was employed today to elicit superior memory formation and behavioral change.  4. Obesity: BMI 66  Vanessa Dean is currently in the action stage of change. As such, her goal is to continue with weight loss efforts. She has agreed to the Category 1 Plan.   Discussed several options for meals.  Exercise goals: No exercise has been prescribed at this time.  Behavioral modification strategies: increasing lean protein intake and meal planning and cooking strategies.  Vanessa Dean has agreed to follow-up with our clinic in 3 weeks.    Objective:   Blood pressure 119/82, pulse 82, temperature 98.2 F (36.8 C), height 5' 5"  (1.651 m), weight 262 lb (118.8 kg), SpO2 97 %. Body mass index is 43.6 kg/m.  General: Cooperative, alert, well developed, in no acute distress. HEENT: Conjunctivae and lids unremarkable. Cardiovascular: Regular rhythm.  Lungs: Normal work of breathing. Neurologic: No focal deficits.   Lab Results  Component Value Date   CREATININE 1.19 (H) 03/17/2020   BUN 19 03/17/2020   NA 142 03/17/2020   K 3.5 03/17/2020   CL 101 03/17/2020   CO2 27 03/17/2020   Lab Results  Component Value Date   ALT 17 03/17/2020   AST 24 03/17/2020   ALKPHOS 108 03/17/2020   BILITOT 0.5 03/17/2020   Lab Results  Component Value Date   HGBA1C 5.8 (H) 03/17/2020   HGBA1C 6.5 (H) 09/09/2019   Lab Results  Component Value Date   INSULIN  12.3 03/17/2020   INSULIN 15.4 09/09/2019   Lab Results  Component Value Date   TSH 0.688 09/09/2019   Lab Results  Component Value Date   CHOL 155 03/17/2020   HDL 59 03/17/2020   LDLCALC 81 03/17/2020   TRIG 79 03/17/2020   CHOLHDL 2.6 03/17/2020   Lab Results  Component Value Date   WBC 6.8 09/09/2019   HGB 12.2 09/09/2019   HCT 37.0 09/09/2019   MCV 91 09/09/2019   PLT 311 09/09/2019   Attestation Statements:   Reviewed by clinician on day of visit:  allergies, medications, problem list, medical history, surgical history, family history, social history, and previous encounter notes.  I, Water quality scientist, CMA, am acting as Location manager for [employer], [place].  I have reviewed the above documentation for accuracy and completeness, and I agree with the above. -  Georgianne Fick, FNP

## 2020-06-11 ENCOUNTER — Encounter (INDEPENDENT_AMBULATORY_CARE_PROVIDER_SITE_OTHER): Payer: Self-pay | Admitting: Family Medicine

## 2020-06-11 ENCOUNTER — Other Ambulatory Visit (INDEPENDENT_AMBULATORY_CARE_PROVIDER_SITE_OTHER): Payer: Self-pay | Admitting: Family Medicine

## 2020-06-11 DIAGNOSIS — E1159 Type 2 diabetes mellitus with other circulatory complications: Secondary | ICD-10-CM

## 2020-06-11 DIAGNOSIS — I152 Hypertension secondary to endocrine disorders: Secondary | ICD-10-CM

## 2020-06-14 NOTE — Telephone Encounter (Signed)
Refill request

## 2020-06-16 ENCOUNTER — Telehealth (INDEPENDENT_AMBULATORY_CARE_PROVIDER_SITE_OTHER): Payer: Self-pay | Admitting: Emergency Medicine

## 2020-06-16 NOTE — Telephone Encounter (Signed)
Prior Auth- (Approved:[id number])  [facility] [employer]: Wegovy from time period of 06/08/20-01/08/21

## 2020-07-06 ENCOUNTER — Other Ambulatory Visit: Payer: Self-pay

## 2020-07-06 ENCOUNTER — Encounter (INDEPENDENT_AMBULATORY_CARE_PROVIDER_SITE_OTHER): Payer: Self-pay | Admitting: Family Medicine

## 2020-07-06 ENCOUNTER — Other Ambulatory Visit (INDEPENDENT_AMBULATORY_CARE_PROVIDER_SITE_OTHER): Payer: Self-pay | Admitting: Family Medicine

## 2020-07-06 ENCOUNTER — Ambulatory Visit (INDEPENDENT_AMBULATORY_CARE_PROVIDER_SITE_OTHER): Payer: BC Managed Care – PPO | Admitting: Family Medicine

## 2020-07-06 VITALS — BP 120/80 | HR 88 | Temp 98.0°F | Ht 65.0 in | Wt 261.0 lb

## 2020-07-06 DIAGNOSIS — I152 Hypertension secondary to endocrine disorders: Secondary | ICD-10-CM

## 2020-07-06 DIAGNOSIS — E1159 Type 2 diabetes mellitus with other circulatory complications: Secondary | ICD-10-CM

## 2020-07-06 DIAGNOSIS — Z9189 Other specified personal risk factors, not elsewhere classified: Secondary | ICD-10-CM | POA: Diagnosis not present

## 2020-07-06 DIAGNOSIS — Z6841 Body Mass Index (BMI) 40.0 and over, adult: Secondary | ICD-10-CM

## 2020-07-06 DIAGNOSIS — E65 Localized adiposity: Secondary | ICD-10-CM

## 2020-07-06 MED ORDER — WEGOVY 0.5 MG/0.5ML ~~LOC~~ SOAJ: 0.5000 mg | SUBCUTANEOUS | 0 refills | Status: DC

## 2020-07-06 NOTE — Telephone Encounter (Signed)
Refill request

## 2020-07-07 ENCOUNTER — Encounter (INDEPENDENT_AMBULATORY_CARE_PROVIDER_SITE_OTHER): Payer: Self-pay | Admitting: Family Medicine

## 2020-07-07 ENCOUNTER — Other Ambulatory Visit (INDEPENDENT_AMBULATORY_CARE_PROVIDER_SITE_OTHER): Payer: Self-pay | Admitting: Family Medicine

## 2020-07-07 DIAGNOSIS — E1169 Type 2 diabetes mellitus with other specified complication: Secondary | ICD-10-CM

## 2020-07-07 MED ORDER — OZEMPIC (0.25 OR 0.5 MG/DOSE) 2 MG/1.5ML ~~LOC~~ SOPN: 0.5000 mg | PEN_INJECTOR | SUBCUTANEOUS | 0 refills | Status: DC

## 2020-07-07 NOTE — Progress Notes (Signed)
Chief Complaint:   OBESITY Vanessa Dean is here to discuss her progress with her obesity treatment plan along with follow-up of her obesity related diagnoses. Vanessa Dean is on the Category 1 Plan and states she is following her eating plan approximately 60% of the time. Vanessa Dean states she is doing 0 minutes 0 times per week.  Today's visit was #: 15 Starting weight: 274 lbs Starting date: 09/09/2019 Today's weight: 261 lbs Today's date: 07/06/2020 Total lbs lost to date: 13 Total lbs lost since last in-office visit: 1  Interim History: Vanessa Dean notes she is unable to eat all of her protein at times due to [facility]. She says she does not drink enough water. She sometimes skips lunch. She notes her back pain keeps her from exercise. She has pain and numbness, and walking increases her pain.  Subjective:   1. Visceral obesity Vanessa Dean's visceral obesity rating is 15-goal is <12. She is on Wegovy 0.25 mg. Her hunger is well controlled.  2. Hypertension associated with type 2 diabetes mellitus ([place]) Vanessa Dean's blood pressure is well controlled on hydrochlorothiazide and Valsartan.  BP Readings from Last 3 Encounters:  07/06/20 120/80  06/08/20 119/82  05/18/20 138/87   Lab Results  Component Value Date   CREATININE 1.19 (H) 03/17/2020   CREATININE 0.94 09/09/2019   CREATININE 1.06 (H) 06/30/2019   3. At risk for activity intolerance Vanessa Dean is at risk for exercise intolerance due to back pain.  Assessment/Plan:   1. Visceral obesity Vanessa Dean agreed to increase Wegovy to 0.5 mg weekly with no refills.  - Semaglutide-Weight Management (WEGOVY) 0.5 MG/0.5ML SOAJ; Inject 0.5 mg into the skin once a week.  Dispense: 2 mL; Refill: 0  2. Hypertension associated with type 2 diabetes mellitus ([place]) Vanessa Dean will continue all her medications.  3. At risk for activity intolerance Vanessa Dean was given approximately 15 minutes of exercise intolerance counseling today. She is 55 y.o. female and has risk factors exercise  intolerance including obesity. We discussed intensive lifestyle modifications today with an emphasis on specific weight loss instructions and strategies. Vanessa Dean will slowly increase activity as tolerated.  Repetitive spaced learning was employed today to elicit superior memory formation and behavioral change.  4. Obesity: BMI 43.43 Vanessa Dean is currently in the action stage of change. As such, her goal is to continue with weight loss efforts. She has agreed to the Category 1 Plan.   Exercise goals: She is to discuss physical therapy with Orthopedic doctor.  Behavioral modification strategies: increasing lean protein intake.  Vanessa Dean has agreed to follow-up with our clinic in 3 weeks.   Objective:   Blood pressure 120/80, pulse 88, temperature 98 F (36.7 C), height 5' 5"  (1.651 m), weight 261 lb (118.4 kg), SpO2 97 %. Body mass index is 43.43 kg/m.  General: Cooperative, alert, well developed, in no acute distress. HEENT: Conjunctivae and lids unremarkable. Cardiovascular: Regular rhythm.  Lungs: Normal work of breathing. Neurologic: No focal deficits.   Lab Results  Component Value Date   CREATININE 1.19 (H) 03/17/2020   BUN 19 03/17/2020   NA 142 03/17/2020   K 3.5 03/17/2020   CL 101 03/17/2020   CO2 27 03/17/2020   Lab Results  Component Value Date   ALT 17 03/17/2020   AST 24 03/17/2020   ALKPHOS 108 03/17/2020   BILITOT 0.5 03/17/2020   Lab Results  Component Value Date   HGBA1C 5.8 (H) 03/17/2020   HGBA1C 6.5 (H) 09/09/2019   Lab Results  Component Value Date  INSULIN 12.3 03/17/2020   INSULIN 15.4 09/09/2019   Lab Results  Component Value Date   TSH 0.688 09/09/2019   Lab Results  Component Value Date   CHOL 155 03/17/2020   HDL 59 03/17/2020   LDLCALC 81 03/17/2020   TRIG 79 03/17/2020   CHOLHDL 2.6 03/17/2020   Lab Results  Component Value Date   WBC 6.8 09/09/2019   HGB 12.2 09/09/2019   HCT 37.0 09/09/2019   MCV 91 09/09/2019   PLT 311  09/09/2019   No results found for: IRON, TIBC, FERRITIN  Attestation Statements:   Reviewed by clinician on day of visit: allergies, medications, problem list, medical history, surgical history, family history, social history, and previous encounter notes.   Wilhemena Durie, am acting as Location manager for [employer], FNP-C.  I have reviewed the above documentation for accuracy and completeness, and I agree with the above. -  Georgianne Fick, FNP'

## 2020-07-07 NOTE — Addendum Note (Signed)
Addended by: Georgianne Fick on: 07/07/2020 04:50 PM   Modules accepted: Orders

## 2020-07-07 NOTE — Addendum Note (Signed)
Addended by: Georgianne Fick on: 07/07/2020 04:14 PM   Modules accepted: Orders

## 2020-07-12 NOTE — Telephone Encounter (Signed)
Last seen by [facility]

## 2020-07-12 NOTE — Telephone Encounter (Signed)
Please review

## 2020-07-12 NOTE — Telephone Encounter (Signed)
Pt last seen by Dr. Raliegh Scarlet.

## 2020-07-27 ENCOUNTER — Other Ambulatory Visit: Payer: Self-pay

## 2020-07-27 ENCOUNTER — Ambulatory Visit (INDEPENDENT_AMBULATORY_CARE_PROVIDER_SITE_OTHER): Payer: BC Managed Care – PPO | Admitting: Family Medicine

## 2020-07-27 ENCOUNTER — Encounter (INDEPENDENT_AMBULATORY_CARE_PROVIDER_SITE_OTHER): Payer: Self-pay | Admitting: Family Medicine

## 2020-07-27 VITALS — BP 109/81 | HR 84 | Temp 98.4°F | Ht 65.0 in | Wt 259.0 lb

## 2020-07-27 DIAGNOSIS — Z6841 Body Mass Index (BMI) 40.0 and over, adult: Secondary | ICD-10-CM

## 2020-07-27 DIAGNOSIS — E1169 Type 2 diabetes mellitus with other specified complication: Secondary | ICD-10-CM

## 2020-07-27 DIAGNOSIS — I152 Hypertension secondary to endocrine disorders: Secondary | ICD-10-CM | POA: Diagnosis not present

## 2020-07-27 DIAGNOSIS — E1159 Type 2 diabetes mellitus with other circulatory complications: Secondary | ICD-10-CM | POA: Diagnosis not present

## 2020-07-27 MED ORDER — OZEMPIC (0.25 OR 0.5 MG/DOSE) 2 MG/1.5ML ~~LOC~~ SOPN: 0.5000 mg | PEN_INJECTOR | SUBCUTANEOUS | 0 refills | Status: DC

## 2020-07-27 MED ORDER — HYDROCHLOROTHIAZIDE 12.5 MG PO TABS: 12.5000 mg | ORAL_TABLET | Freq: Every day | ORAL | 0 refills | Status: DC

## 2020-07-27 NOTE — Progress Notes (Signed)
Chief Complaint:   OBESITY Vanessa Dean is here to discuss her progress with her obesity treatment plan along with follow-up of her obesity related diagnoses. Vanessa Dean is on the Category 1 Plan and states she is following her eating plan approximately 70% of the time. Vanessa Dean states she is not currently exercising.  Today's visit was #: 73 Starting weight: 274 lbs Starting date: 09/09/2019 Today's weight: 259 lbs Today's date: 07/27/2020 Total lbs lost to date: 15 lbs Total lbs lost since last in-office visit: 2  Interim History: Vanessa Dean has done well with the plan and is down 2 lbs. She is doing better with getting food in and skipping lunch less often. She is working on eating protein first. She is supposed to start water therapy (swimming) as directed by neurology for back issues. She needs to lose weight (down to 240 lbs BMI 39) for back fusion, if needed.  Subjective:   1. Hypertension associated with type 2 diabetes mellitus (HCC) BP is well controlled. Vanessa Dean is on HCTZ and valsartan.   BP Readings from Last 3 Encounters:  07/27/20 109/81  07/06/20 120/80  06/08/20 119/82    2. Type 2 diabetes mellitus with other specified complication, without long-term current use of insulin ([place]) Diabetes is well controlled. Vanessa Dean's last A1c was 5.8. Ozempic is working well for appetite suppression and satiety.  Assessment/Plan:   1. Hypertension associated with type 2 diabetes mellitus (HCC) Refill: - hydrochlorothiazide (HYDRODIURIL) 12.5 MG tablet; Take 1 tablet (12.5 mg total) by mouth daily. Take as needed for edema.  Dispense: 90 tablet; Refill: 0  2. Type 2 diabetes mellitus with other specified complication, without long-term current use of insulin (HCC) refill - Semaglutide,0.25 or 0.5MG/DOS, (OZEMPIC, 0.25 OR 0.5 MG/DOSE,) 2 MG/1.5ML SOPN; Inject 0.5 mg into the skin once a week.  Dispense: 1.5 mL; Refill: 0  3. Obesity: BMI 43.1 Vanessa Dean is currently in the action stage of change. As  such, her goal is to continue with weight loss efforts. She has agreed to the Category 1 Plan.   Exercise goals: Start swimming 2 times a week.  Behavioral modification strategies: increasing lean protein intake and no skipping meals.  Vanessa Dean has agreed to follow-up with our clinic in 3 weeks with Vanessa Dean  Objective:   Blood pressure 109/81, pulse 84, temperature 98.4 F (36.9 C), height 5' 5"  (1.651 m), weight 259 lb (117.5 kg), SpO2 98 %. Body mass index is 43.1 kg/m.  General: Cooperative, alert, well developed, in no acute distress. HEENT: Conjunctivae and lids unremarkable. Cardiovascular: Regular rhythm.  Lungs: Normal work of breathing. Neurologic: No focal deficits.   Lab Results  Component Value Date   CREATININE 1.19 (H) 03/17/2020   BUN 19 03/17/2020   NA 142 03/17/2020   K 3.5 03/17/2020   CL 101 03/17/2020   CO2 27 03/17/2020   Lab Results  Component Value Date   ALT 17 03/17/2020   AST 24 03/17/2020   ALKPHOS 108 03/17/2020   BILITOT 0.5 03/17/2020   Lab Results  Component Value Date   HGBA1C 5.8 (H) 03/17/2020   HGBA1C 6.5 (H) 09/09/2019   Lab Results  Component Value Date   INSULIN 12.3 03/17/2020   INSULIN 15.4 09/09/2019   Lab Results  Component Value Date   TSH 0.688 09/09/2019   Lab Results  Component Value Date   CHOL 155 03/17/2020   HDL 59 03/17/2020   LDLCALC 81 03/17/2020   TRIG 79 03/17/2020   CHOLHDL 2.6 03/17/2020  Lab Results  Component Value Date   WBC 6.8 09/09/2019   HGB 12.2 09/09/2019   HCT 37.0 09/09/2019   MCV 91 09/09/2019   PLT 311 09/09/2019     Attestation Statements:   Reviewed by clinician on day of visit: allergies, medications, problem list, medical history, surgical history, family history, social history, and previous encounter notes.  Coral Ceo, CMA, am acting as Location manager for [employer], [place].  I have reviewed the above documentation for accuracy and completeness, and I agree with  the above. -  Georgianne Fick, FNP

## 2020-07-30 ENCOUNTER — Other Ambulatory Visit (INDEPENDENT_AMBULATORY_CARE_PROVIDER_SITE_OTHER): Payer: Self-pay | Admitting: Family Medicine

## 2020-07-30 DIAGNOSIS — E1159 Type 2 diabetes mellitus with other circulatory complications: Secondary | ICD-10-CM

## 2020-08-04 NOTE — Telephone Encounter (Signed)
Pt last seen by Dawn Whitmire, FNP.  

## 2020-08-04 NOTE — Telephone Encounter (Signed)
Would you like to refill?

## 2020-08-06 ENCOUNTER — Ambulatory Visit: Payer: BC Managed Care – PPO | Admitting: Plastic Surgery

## 2020-08-24 ENCOUNTER — Ambulatory Visit (INDEPENDENT_AMBULATORY_CARE_PROVIDER_SITE_OTHER): Payer: BC Managed Care – PPO | Admitting: Adult Health

## 2020-08-31 ENCOUNTER — Other Ambulatory Visit (INDEPENDENT_AMBULATORY_CARE_PROVIDER_SITE_OTHER): Payer: Self-pay | Admitting: Family Medicine

## 2020-08-31 ENCOUNTER — Encounter (INDEPENDENT_AMBULATORY_CARE_PROVIDER_SITE_OTHER): Payer: Self-pay

## 2020-08-31 DIAGNOSIS — E1159 Type 2 diabetes mellitus with other circulatory complications: Secondary | ICD-10-CM

## 2020-08-31 NOTE — Telephone Encounter (Signed)
Message sent to pt-CAS 

## 2020-08-31 NOTE — Telephone Encounter (Signed)
Last OV with Dawn 

## 2020-09-07 ENCOUNTER — Other Ambulatory Visit (HOSPITAL_COMMUNITY): Payer: Self-pay | Admitting: Interventional Radiology

## 2020-09-10 ENCOUNTER — Other Ambulatory Visit: Payer: Self-pay

## 2020-09-10 ENCOUNTER — Encounter: Payer: Self-pay | Admitting: Plastic Surgery

## 2020-09-10 ENCOUNTER — Ambulatory Visit (INDEPENDENT_AMBULATORY_CARE_PROVIDER_SITE_OTHER): Payer: BC Managed Care – PPO | Admitting: Plastic Surgery

## 2020-09-10 VITALS — BP 121/81 | HR 83 | Ht 65.0 in | Wt 265.0 lb

## 2020-09-10 DIAGNOSIS — Z9889 Other specified postprocedural states: Secondary | ICD-10-CM

## 2020-09-10 NOTE — Progress Notes (Signed)
Procedure Note  Preoperative Dx: scar contracture left areola  Postoperative Dx: Same  Procedure: release of scar contracture of left areola 1 cm  Anesthesia: Lidocaine 1% with 1:100,000 epinepherine  Description of Procedure: Risks and complications were explained to the patient.  Consent was confirmed and the patient understands the risks and benefits.  The potential complications and alternatives were explained and the patient consents.  The patient expressed understanding the option of not having the procedure and the risks of a scar.  Time out was called and all information was confirmed to be correct.    The area was prepped and drapped.  Lidocaine 1% with epinepherine was injected in the subcutaneous area.  After waiting several minutes for the local to take affect a #15 blade was used to incise 2 mm of skin at the areola - skin juncture around 5 o'clock.  The tissue scissors were placed under the skin and used to release the scar tissue.  The skin edges were reapproximated with 6-0 Monocryl.  A dressing was applied.  The patient was given instructions on how to care for the area and a follow up appointment.  Juley tolerated the procedure well and there were no complications.

## 2020-09-13 ENCOUNTER — Other Ambulatory Visit: Payer: Self-pay

## 2020-09-13 ENCOUNTER — Encounter (INDEPENDENT_AMBULATORY_CARE_PROVIDER_SITE_OTHER): Payer: Self-pay | Admitting: Family Medicine

## 2020-09-13 ENCOUNTER — Ambulatory Visit (INDEPENDENT_AMBULATORY_CARE_PROVIDER_SITE_OTHER): Payer: BC Managed Care – PPO | Admitting: Family Medicine

## 2020-09-13 VITALS — BP 130/78 | HR 80 | Temp 98.5°F | Ht 65.0 in | Wt 260.0 lb

## 2020-09-13 DIAGNOSIS — Z6841 Body Mass Index (BMI) 40.0 and over, adult: Secondary | ICD-10-CM

## 2020-09-13 DIAGNOSIS — E1169 Type 2 diabetes mellitus with other specified complication: Secondary | ICD-10-CM | POA: Diagnosis not present

## 2020-09-13 DIAGNOSIS — I152 Hypertension secondary to endocrine disorders: Secondary | ICD-10-CM | POA: Diagnosis not present

## 2020-09-13 DIAGNOSIS — E1159 Type 2 diabetes mellitus with other circulatory complications: Secondary | ICD-10-CM | POA: Diagnosis not present

## 2020-09-13 DIAGNOSIS — Z9189 Other specified personal risk factors, not elsewhere classified: Secondary | ICD-10-CM | POA: Diagnosis not present

## 2020-09-13 MED ORDER — SEMAGLUTIDE (1 MG/DOSE) 4 MG/3ML ~~LOC~~ SOPN: 1.0000 mg | PEN_INJECTOR | SUBCUTANEOUS | 0 refills | Status: DC

## 2020-09-20 ENCOUNTER — Encounter (INDEPENDENT_AMBULATORY_CARE_PROVIDER_SITE_OTHER): Payer: Self-pay | Admitting: Family Medicine

## 2020-09-20 NOTE — Progress Notes (Signed)
Chief Complaint:   OBESITY Vanessa Dean is here to discuss her progress with her obesity treatment plan along with follow-up of her obesity related diagnoses. Vanessa Dean is on the Category 1 Plan and states she is following her eating plan approximately 60% of the time. Vanessa Dean states she is walking for 30 minutes 1 time per week.  Today's visit was #: 1 Starting weight: 274 lbs Starting date: 09/09/2019 Today's weight: 260 lbs Today's date: 09/13/2020 Total lbs lost to date: 14 Total lbs lost since last in-office visit: 0  Interim History: Vanessa Dean went to the beach and she says she was "off the rails" as far as her eating was concerned. She got back from her trip on June 30th, but she did not get back on the plan until recently. Her protein intake has not been good.  Subjective:   1. Type 2 diabetes mellitus with other specified complication, without long-term current use of insulin (HCC) Vanessa Dean's diabetes mellitus is well controlled. Her last A1c was 5.8. She is on Ozempic 0.5 mg weekly.  Lab Results  Component Value Date   HGBA1C 5.8 (H) 03/17/2020   HGBA1C 6.5 (H) 09/09/2019   Lab Results  Component Value Date   LDLCALC 81 03/17/2020   CREATININE 1.19 (H) 03/17/2020   Lab Results  Component Value Date   INSULIN 12.3 03/17/2020   INSULIN 15.4 09/09/2019   2. Hypertension associated with diabetes ([place]) Vanessa Dean's blood pressure is well controlled with Valsartan and hydrochlorothiazide.   BP Readings from Last 3 Encounters:  09/13/20 130/78  09/10/20 121/81  07/27/20 109/81   Lab Results  Component Value Date   CREATININE 1.19 (H) 03/17/2020   CREATININE 0.94 09/09/2019   CREATININE 1.06 (H) 06/30/2019   3. At risk for impaired metabolic function Vanessa Dean is at increased risk for impaired metabolic function due to insufficient protein intake.  Assessment/Plan:   1. Type 2 diabetes mellitus with other specified complication, without long-term current use of insulin ([place]) Vanessa Dean  agreed to increase Ozempic to 1 mg weekly with no refills.  - Semaglutide, 1 MG/DOSE, 4 MG/3ML SOPN; Inject 1 mg as directed once a week.  Dispense: 3 mL; Refill: 0  2. Hypertension associated with diabetes ([place]) Vanessa Dean will continue valsartan and hydrochlorothiazide.   3. At risk for impaired metabolic function Vanessa Dean was given approximately 15 minutes of impaired  metabolic function prevention counseling today. We discussed intensive lifestyle modifications today with an emphasis on specific nutrition and exercise instructions and strategies.   Repetitive spaced learning was employed today to elicit superior memory formation and behavioral change.  4. Obesity: BMI 43.27 Vanessa Dean is currently in the action stage of change. As such, her goal is to continue with weight loss efforts. She has agreed to the Category 1 Plan + 100 calories or keeping a food journal and adhering to recommended goals of 1050-1150 calories and 75 grams of protein daily.   Exercise goals: Increase exercise. We discussed starting water aerobics.  Behavioral modification strategies: increasing lean protein intake.  Vanessa Dean has agreed to follow-up with our clinic in 3 weeks.  Objective:   Blood pressure 130/78, pulse 80, temperature 98.5 F (36.9 C), height 5' 5"  (1.651 m), weight 260 lb (117.9 kg), SpO2 95 %. Body mass index is 43.27 kg/m.  General: Cooperative, alert, well developed, in no acute distress. HEENT: Conjunctivae and lids unremarkable. Cardiovascular: Regular rhythm.  Lungs: Normal work of breathing. Neurologic: No focal deficits.   Lab Results  Component Value Date  CREATININE 1.19 (H) 03/17/2020   BUN 19 03/17/2020   NA 142 03/17/2020   K 3.5 03/17/2020   CL 101 03/17/2020   CO2 27 03/17/2020   Lab Results  Component Value Date   ALT 17 03/17/2020   AST 24 03/17/2020   ALKPHOS 108 03/17/2020   BILITOT 0.5 03/17/2020   Lab Results  Component Value Date   HGBA1C 5.8 (H) 03/17/2020    HGBA1C 6.5 (H) 09/09/2019   Lab Results  Component Value Date   INSULIN 12.3 03/17/2020   INSULIN 15.4 09/09/2019   Lab Results  Component Value Date   TSH 0.688 09/09/2019   Lab Results  Component Value Date   CHOL 155 03/17/2020   HDL 59 03/17/2020   LDLCALC 81 03/17/2020   TRIG 79 03/17/2020   CHOLHDL 2.6 03/17/2020   Lab Results  Component Value Date   VD25OH 52.2 03/17/2020   VD25OH 30.9 09/09/2019   Lab Results  Component Value Date   WBC 6.8 09/09/2019   HGB 12.2 09/09/2019   HCT 37.0 09/09/2019   MCV 91 09/09/2019   PLT 311 09/09/2019   No results found for: IRON, TIBC, FERRITIN  Attestation Statements:   Reviewed by clinician on day of visit: allergies, medications, problem list, medical history, surgical history, family history, social history, and previous encounter notes.   Wilhemena Durie, am acting as Location manager for [employer], FNP-C.  I have reviewed the above documentation for accuracy and completeness, and I agree with the above. - Georgianne Fick, FNP

## 2020-10-05 ENCOUNTER — Other Ambulatory Visit (HOSPITAL_COMMUNITY): Payer: Self-pay | Admitting: Interventional Radiology

## 2020-10-05 ENCOUNTER — Telehealth (HOSPITAL_COMMUNITY): Payer: Self-pay

## 2020-10-05 DIAGNOSIS — I771 Stricture of artery: Secondary | ICD-10-CM

## 2020-10-05 NOTE — Telephone Encounter (Signed)
Called to schedule mrv, no answer, left vm. AW

## 2020-10-07 ENCOUNTER — Encounter (INDEPENDENT_AMBULATORY_CARE_PROVIDER_SITE_OTHER): Payer: Self-pay | Admitting: Family Medicine

## 2020-10-07 ENCOUNTER — Other Ambulatory Visit: Payer: Self-pay

## 2020-10-07 ENCOUNTER — Ambulatory Visit (INDEPENDENT_AMBULATORY_CARE_PROVIDER_SITE_OTHER): Payer: BC Managed Care – PPO | Admitting: Family Medicine

## 2020-10-07 VITALS — BP 124/81 | HR 91 | Temp 98.1°F | Ht 65.0 in | Wt 260.0 lb

## 2020-10-07 DIAGNOSIS — Z9189 Other specified personal risk factors, not elsewhere classified: Secondary | ICD-10-CM

## 2020-10-07 DIAGNOSIS — E1159 Type 2 diabetes mellitus with other circulatory complications: Secondary | ICD-10-CM

## 2020-10-07 DIAGNOSIS — I152 Hypertension secondary to endocrine disorders: Secondary | ICD-10-CM | POA: Diagnosis not present

## 2020-10-07 DIAGNOSIS — E1169 Type 2 diabetes mellitus with other specified complication: Secondary | ICD-10-CM

## 2020-10-07 DIAGNOSIS — Z6841 Body Mass Index (BMI) 40.0 and over, adult: Secondary | ICD-10-CM

## 2020-10-07 MED ORDER — SEMAGLUTIDE (1 MG/DOSE) 4 MG/3ML ~~LOC~~ SOPN: 1.0000 mg | PEN_INJECTOR | SUBCUTANEOUS | 0 refills | Status: DC

## 2020-10-07 MED ORDER — VALSARTAN 40 MG PO TABS: 40.0000 mg | ORAL_TABLET | Freq: Every day | ORAL | 0 refills | Status: DC

## 2020-10-07 NOTE — Progress Notes (Signed)
Chief Complaint:   OBESITY Vanessa Dean is here to discuss her progress with her obesity treatment plan along with follow-up of her obesity related diagnoses. Vanessa Dean is on the Category 1+100 calories Plan or keeping a food journal and adhering to recommended goals of 1050-1150 calories and 75 grams of protein and states she is following her eating plan approximately 60% of the time. Vanessa Dean states she is walking for 10 minutes 3 times per week.  Today's visit was #: 18 Starting weight: 274 lbs Starting date: 09/09/2019 Today's weight: 260 lbs Today's date: 10/07/2020 Total lbs lost to date: 14 lbs Total lbs lost since last in-office visit: maintained  Interim History: Vanessa Dean helped with vacation Vanessa Dean last week which had her somewhat off plan. She has been eating later in the evening.  She does a good job with packing her lunch to take it to work.  Subjective:   1. Hypertension associated with diabetes (Vanessa Dean) Camielle's hypertension is controlled. She is on HCTZ and Diovan.   BP Readings from Last 3 Encounters:  10/07/20 124/81  09/13/20 130/78  09/10/20 121/81    2. Type 2 diabetes mellitus with other specified complication, without long-term current use of insulin (HCC) Vanessa Dean's diabetes is well controlled. We increased dose of her Ozempic at her last office visit but she has not started it. She notes the some constipation. She denies nausea. She is using Citrucel. Her CBGs were in the 90s. She denies hypoglycemia.  Lab Results  Component Value Date   HGBA1C 5.8 (H) 03/17/2020   HGBA1C 6.5 (H) 09/09/2019   Lab Results  Component Value Date   LDLCALC 81 03/17/2020   CREATININE 1.19 (H) 03/17/2020   Lab Results  Component Value Date   INSULIN 12.3 03/17/2020   INSULIN 15.4 09/09/2019    3. At risk for constipation Vanessa Dean is at risk constipation due to taking Ozempic.  Assessment/Plan:   1. Hypertension associated with diabetes (Vanessa Dean) Vanessa Dean is working on healthy weight  loss and exercise to improve blood pressure control. We will refill Valsartan 40 mg for 1 month with no refills. We will watch for signs of hypotension as she continues her lifestyle modifications.  - valsartan (DIOVAN) 40 MG tablet; Take 1 tablet (40 mg total) by mouth daily.  Dispense: 30 tablet; Refill: 0  2. Type 2 diabetes mellitus with other specified complication, without long-term current use of insulin (HCC) We will refill Ozempic 1 mg weekly.   - Semaglutide, 1 MG/DOSE, 4 MG/3ML SOPN; Inject 1 mg as directed once a week.  Dispense: 3 mL; Refill: 0  3. At risk for constipation Vanessa Dean was given approximately 15 minutes of counseling today regarding prevention of constipation. She was encouraged to increase water and fiber intake.    4. Obesity: BMI 43.27 Vanessa Dean is currently in the action stage of change. As such, her goal is to continue with weight loss efforts. She has agreed to the Category 1+100 calories Plan or keeping a food journal and adhering to recommended goals of 1050-1150 calories and 75 grams of  protein.   Exercise goals:  As is.  Behavioral modification strategies: increasing lean protein intake.  Vanessa Dean has agreed to follow-up with our clinic in 3 weeks (fasting).  Objective:   Blood pressure 124/81, pulse 91, temperature 98.1 F (36.7 C), height 5' 5"  (1.651 m), weight 260 lb (117.9 kg), SpO2 95 %. Body mass index is 43.27 kg/m.  General: Cooperative, alert, well developed, in no acute distress. HEENT:  Conjunctivae and lids unremarkable. Cardiovascular: Regular rhythm.  Lungs: Normal work of breathing. Neurologic: No focal deficits.   Lab Results  Component Value Date   CREATININE 1.19 (H) 03/17/2020   BUN 19 03/17/2020   NA 142 03/17/2020   K 3.5 03/17/2020   CL 101 03/17/2020   CO2 27 03/17/2020   Lab Results  Component Value Date   ALT 17 03/17/2020   AST 24 03/17/2020   ALKPHOS 108 03/17/2020   BILITOT 0.5 03/17/2020   Lab Results  Component  Value Date   HGBA1C 5.8 (H) 03/17/2020   HGBA1C 6.5 (H) 09/09/2019   Lab Results  Component Value Date   INSULIN 12.3 03/17/2020   INSULIN 15.4 09/09/2019   Lab Results  Component Value Date   TSH 0.688 09/09/2019   Lab Results  Component Value Date   CHOL 155 03/17/2020   HDL 59 03/17/2020   LDLCALC 81 03/17/2020   TRIG 79 03/17/2020   CHOLHDL 2.6 03/17/2020   Lab Results  Component Value Date   VD25OH 52.2 03/17/2020   VD25OH 30.9 09/09/2019   Lab Results  Component Value Date   WBC 6.8 09/09/2019   HGB 12.2 09/09/2019   HCT 37.0 09/09/2019   MCV 91 09/09/2019   PLT 311 09/09/2019   No results found for: IRON, TIBC, FERRITIN  Attestation Statements:   Reviewed by clinician on day of visit: allergies, medications, problem list, medical history, surgical history, family history, social history, and previous encounter notes.  I, Vanessa Dean, RMA, am acting as Location manager for [employer], [place].  I have reviewed the above documentation for accuracy and completeness, and I agree with the above. -  Vanessa Fick, FNP

## 2020-10-12 ENCOUNTER — Ambulatory Visit: Payer: BC Managed Care – PPO | Admitting: Surgical

## 2020-10-25 ENCOUNTER — Other Ambulatory Visit: Payer: Self-pay

## 2020-10-25 ENCOUNTER — Ambulatory Visit (HOSPITAL_COMMUNITY)
Admission: RE | Admit: 2020-10-25 | Discharge: 2020-10-25 | Disposition: A | Discharge status: Home / Self Care | Payer: BC Managed Care – PPO | Source: Ambulatory Visit | Attending: Interventional Radiology | Admitting: Interventional Radiology

## 2020-10-25 DIAGNOSIS — I771 Stricture of artery: Secondary | ICD-10-CM | POA: Diagnosis present

## 2020-10-25 MED ORDER — GADOBUTROL 1 MMOL/ML IV SOLN
10.0000 mL | Freq: Once | INTRAVENOUS | Status: AC | PRN
  Administered 2020-10-25: 10 mL via INTRAVENOUS

## 2020-10-27 ENCOUNTER — Telehealth (HOSPITAL_COMMUNITY): Payer: Self-pay

## 2020-10-27 NOTE — Telephone Encounter (Signed)
Pt agreed to f/u in 1 year with mrv. AW

## 2020-10-28 ENCOUNTER — Other Ambulatory Visit: Payer: Self-pay

## 2020-10-28 ENCOUNTER — Encounter (INDEPENDENT_AMBULATORY_CARE_PROVIDER_SITE_OTHER): Payer: Self-pay | Admitting: Family Medicine

## 2020-10-28 ENCOUNTER — Ambulatory Visit (INDEPENDENT_AMBULATORY_CARE_PROVIDER_SITE_OTHER): Payer: BC Managed Care – PPO | Admitting: Family Medicine

## 2020-10-28 VITALS — BP 118/82 | HR 78 | Temp 97.7°F | Ht 65.0 in | Wt 258.0 lb

## 2020-10-28 DIAGNOSIS — I152 Hypertension secondary to endocrine disorders: Secondary | ICD-10-CM

## 2020-10-28 DIAGNOSIS — E1169 Type 2 diabetes mellitus with other specified complication: Secondary | ICD-10-CM

## 2020-10-28 DIAGNOSIS — E1159 Type 2 diabetes mellitus with other circulatory complications: Secondary | ICD-10-CM

## 2020-10-28 DIAGNOSIS — E559 Vitamin D deficiency, unspecified: Secondary | ICD-10-CM | POA: Diagnosis not present

## 2020-10-28 DIAGNOSIS — Z9189 Other specified personal risk factors, not elsewhere classified: Secondary | ICD-10-CM | POA: Diagnosis not present

## 2020-10-28 DIAGNOSIS — Z6841 Body Mass Index (BMI) 40.0 and over, adult: Secondary | ICD-10-CM

## 2020-10-28 DIAGNOSIS — E785 Hyperlipidemia, unspecified: Secondary | ICD-10-CM

## 2020-10-28 MED ORDER — SEMAGLUTIDE (1 MG/DOSE) 4 MG/3ML ~~LOC~~ SOPN: 1.0000 mg | PEN_INJECTOR | SUBCUTANEOUS | 0 refills | Status: DC

## 2020-10-28 NOTE — Progress Notes (Addendum)
Chief Complaint:   OBESITY Vanessa Dean is here to discuss her progress with her obesity treatment plan along with follow-up of her obesity related diagnoses. Vanessa Dean is on the Category 1 Plan+100 calories and states she is following her eating plan approximately 70% of the time. Vanessa Dean states she is walking for 10 minutes 3 times per week.  Today's visit was #: 30 Starting weight: 274 lbs Starting date: 09/09/2019 Today's weight: 258 lbs Today's date: 10/28/2020 Total lbs lost to date: 16 lbs Total lbs lost since last in-office visit: 2 lbs  Interim History: Vanessa Dean is struggling to get in the protein but generally does get it in. Her hungers and cravings are well controlled. She is doing very well on the plan.  Subjective:   1. Type 2 diabetes mellitus with other specified complication, without long-term current use of insulin (HCC) Vanessa Dean is on Ozempic 1 mg. She notes constipation and is using fiber for this with good results. Her last A1C was 5.8. Ozempic is working well for her appetite.  Lab Results  Component Value Date   HGBA1C 5.8 (H) 03/17/2020   HGBA1C 6.5 (H) 09/09/2019   Lab Results  Component Value Date   LDLCALC 81 03/17/2020   CREATININE 1.19 (H) 03/17/2020   Lab Results  Component Value Date   INSULIN 12.3 03/17/2020   INSULIN 15.4 09/09/2019    2. Hypertension associated with type 2 diabetes mellitus ([place]) Vanessa Dean's hypertension is well controlled on Valsartan. She denies chest pain and SOB.  BP Readings from Last 3 Encounters:  10/28/20 118/82  10/07/20 124/81  09/13/20 130/78     3. Hyperlipidemia associated with type 2 diabetes mellitus ([place]) Vanessa Dean's last FLP were within normal limits. She is on Lipitor 10 mg.   Lab Results  Component Value Date   CHOL 155 03/17/2020   HDL 59 03/17/2020   LDLCALC 81 03/17/2020   TRIG 79 03/17/2020   CHOLHDL 2.6 03/17/2020   Lab Results  Component Value Date   ALT 17 03/17/2020   AST 24 03/17/2020   ALKPHOS 108  03/17/2020   BILITOT 0.5 03/17/2020   The 10-year ASCVD risk score [name], et al., 2013) is: 5.5%   Values used to calculate the score:     Age: 55 years     Sex: Female     Is Non-Hispanic African American: Yes     Diabetic: Yes     Tobacco smoker: No     Systolic Blood Pressure: 062 mmHg     Is BP treated: Yes     HDL Cholesterol: 59 mg/dL     Total Cholesterol: 155 mg/dL   4. Vitamin D deficiency Vanessa Dean's Vitamin D goal (52.2). She takes 2000 IU OTC.  Lab Results  Component Value Date   VD25OH 52.2 03/17/2020   VD25OH 30.9 09/09/2019    5. At risk for impaired metabolic function Vanessa Dean is at risk for impaired metabolic function due to no resistance training.  Assessment/Plan:   1. Type 2 diabetes mellitus with other specified complication, without long-term current use of insulin (HCC) We will refill Ozempic 1 mg weekly.  - Semaglutide, 1 MG/DOSE, 4 MG/3ML SOPN; Inject 1 mg as directed once a week.  Dispense: 3 mL; Refill: 0 - Comprehensive metabolic panel - Hemoglobin A1c  2. Hypertension associated with type 2 diabetes mellitus ([place]) Vanessa Dean will continue Valsartan.   3. Hyperlipidemia associated with type 2 diabetes mellitus ([place]) We will check FLP today. Vanessa Dean will continue  Lipitor.   - Lipid Panel With LDL/HDL Ratio  4. Vitamin D deficiency Low Vitamin D level contributes to fatigue and are associated with obesity, breast, and colon cancer. We will check Vitamin D today. Vanessa Dean will continue to take OTC Vitamin D 2,000 IU daily and she will follow-up for routine testing of Vitamin D, at least 2-3 times per year to avoid over-replacement.  - VITAMIN D 25 Hydroxy (Vit-D Deficiency, Fractures)  5. At risk for impaired metabolic function Vanessa Dean was given approximately 15 minutes of impaired  metabolic function prevention counseling today. We discussed intensive lifestyle modifications today with an emphasis on specific nutrition and exercise instructions and  strategies.   Repetitive spaced learning was employed today to elicit superior memory formation and behavioral change.   6. Obesity: BMI 42.93 Vanessa Dean is currently in the action stage of change. As such, her goal is to continue with weight loss efforts. She has agreed to the Category 1 Plan+100 calories.   We discussed strategies to get in protein.  Exercise goals:  As is.  Behavioral modification strategies: increasing lean protein intake and better snacking choices.  Vanessa Dean has agreed to follow-up with our clinic in 3-4 weeks.   Objective:   Blood pressure 118/82, pulse 78, temperature 97.7 F (36.5 C), height 5' 5"  (1.651 m), weight 258 lb (117 kg), SpO2 96 %. Body mass index is 42.93 kg/m.  General: Cooperative, alert, well developed, in no acute distress. HEENT: Conjunctivae and lids unremarkable. Cardiovascular: Regular rhythm.  Lungs: Normal work of breathing. Neurologic: No focal deficits.   Lab Results  Component Value Date   CREATININE 1.19 (H) 03/17/2020   BUN 19 03/17/2020   NA 142 03/17/2020   K 3.5 03/17/2020   CL 101 03/17/2020   CO2 27 03/17/2020   Lab Results  Component Value Date   ALT 17 03/17/2020   AST 24 03/17/2020   ALKPHOS 108 03/17/2020   BILITOT 0.5 03/17/2020   Lab Results  Component Value Date   HGBA1C 5.8 (H) 03/17/2020   HGBA1C 6.5 (H) 09/09/2019   Lab Results  Component Value Date   INSULIN 12.3 03/17/2020   INSULIN 15.4 09/09/2019   Lab Results  Component Value Date   TSH 0.688 09/09/2019   Lab Results  Component Value Date   CHOL 155 03/17/2020   HDL 59 03/17/2020   LDLCALC 81 03/17/2020   TRIG 79 03/17/2020   CHOLHDL 2.6 03/17/2020   Lab Results  Component Value Date   VD25OH 52.2 03/17/2020   VD25OH 30.9 09/09/2019   Lab Results  Component Value Date   WBC 6.8 09/09/2019   HGB 12.2 09/09/2019   HCT 37.0 09/09/2019   MCV 91 09/09/2019   PLT 311 09/09/2019   No results found for: IRON, TIBC,  FERRITIN  Attestation Statements:   Reviewed by clinician on day of visit: allergies, medications, problem list, medical history, surgical history, family history, social history, and previous encounter notes.  I, Lizbeth Bark, RMA, am acting as Location manager for [employer], [place].   I have reviewed the above documentation for accuracy and completeness, and I agree with the above. -  Georgianne Fick, FNP

## 2020-10-29 LAB — HEMOGLOBIN A1C
Est. average glucose Bld gHb Est-mCnc: 120 mg/dL
Hgb A1c MFr Bld: 5.8 % — ABNORMAL HIGH (ref 4.8–5.6)

## 2020-10-29 LAB — LIPID PANEL WITH LDL/HDL RATIO
Cholesterol, Total: 155 mg/dL (ref 100–199)
HDL: 60 mg/dL (ref >39)
LDL Chol Calc (NIH): 77 mg/dL (ref 0–99)
LDL/HDL Ratio: 1.3 ratio (ref 0.0–3.2)
Triglycerides: 99 mg/dL (ref 0–149)
VLDL Cholesterol Cal: 18 mg/dL (ref 5–40)

## 2020-10-29 LAB — COMPREHENSIVE METABOLIC PANEL
ALT: 20 IU/L (ref 0–32)
AST: 21 IU/L (ref 0–40)
Albumin/Globulin Ratio: 1.5 (ref 1.2–2.2)
Albumin: 4.5 g/dL (ref 3.8–4.9)
Alkaline Phosphatase: 135 IU/L — ABNORMAL HIGH (ref 44–121)
BUN/Creatinine Ratio: 13 (ref 9–23)
BUN: 14 mg/dL (ref 6–24)
Bilirubin Total: 0.5 mg/dL (ref 0.0–1.2)
CO2: 28 mmol/L (ref 20–29)
Calcium: 9.6 mg/dL (ref 8.7–10.2)
Chloride: 100 mmol/L (ref 96–106)
Creatinine, Ser: 1.1 mg/dL — ABNORMAL HIGH (ref 0.57–1.00)
Globulin, Total: 3 g/dL (ref 1.5–4.5)
Glucose: 102 mg/dL — ABNORMAL HIGH (ref 65–99)
Potassium: 4.1 mmol/L (ref 3.5–5.2)
Sodium: 143 mmol/L (ref 134–144)
Total Protein: 7.5 g/dL (ref 6.0–8.5)
eGFR: 60 mL/min/{1.73_m2} (ref >59)

## 2020-10-29 LAB — VITAMIN D 25 HYDROXY (VIT D DEFICIENCY, FRACTURES): Vit D, 25-Hydroxy: 47.9 ng/mL (ref 30.0–100.0)

## 2020-11-02 ENCOUNTER — Other Ambulatory Visit (INDEPENDENT_AMBULATORY_CARE_PROVIDER_SITE_OTHER): Payer: Self-pay | Admitting: Family Medicine

## 2020-11-02 ENCOUNTER — Encounter (INDEPENDENT_AMBULATORY_CARE_PROVIDER_SITE_OTHER): Payer: Self-pay

## 2020-11-02 DIAGNOSIS — E1159 Type 2 diabetes mellitus with other circulatory complications: Secondary | ICD-10-CM

## 2020-11-02 DIAGNOSIS — E1169 Type 2 diabetes mellitus with other specified complication: Secondary | ICD-10-CM

## 2020-11-02 NOTE — Telephone Encounter (Signed)
Message sent to pt-CAS 

## 2020-11-02 NOTE — Telephone Encounter (Signed)
Last OV with Dawn 

## 2020-11-03 ENCOUNTER — Other Ambulatory Visit: Payer: Self-pay

## 2020-11-03 ENCOUNTER — Encounter (INDEPENDENT_AMBULATORY_CARE_PROVIDER_SITE_OTHER): Payer: Self-pay | Admitting: Family Medicine

## 2020-11-03 ENCOUNTER — Ambulatory Visit (INDEPENDENT_AMBULATORY_CARE_PROVIDER_SITE_OTHER): Payer: BC Managed Care – PPO | Admitting: Surgical

## 2020-11-03 DIAGNOSIS — N62 Hypertrophy of breast: Secondary | ICD-10-CM

## 2020-11-03 DIAGNOSIS — G8929 Other chronic pain: Secondary | ICD-10-CM

## 2020-11-03 DIAGNOSIS — M546 Pain in thoracic spine: Secondary | ICD-10-CM

## 2020-11-03 DIAGNOSIS — Z9889 Other specified postprocedural states: Secondary | ICD-10-CM

## 2020-11-03 NOTE — Progress Notes (Signed)
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 Marla Roe on 09/10/2020.  She reports that she is healing well, but feels as if she has not noticed much improvement.  She is bothered by the asymmetry of her nipple areolar complexes.  She is not having infectious symptoms.  She is willing to give it some time to see if there is some improvement.  Chaperone present on exam On exam bilateral NAC's are viable, bilateral breast incisions are intact.  The left NAC has what appears to be some volume loss on the lateral aspect from 3-5 o'clock.  The volume loss is causing the nipple to point to the left side.  Incisions are well-healed.  Recommend allowing some time to see if there is any improvement after recent release of scar.  Patient is aware fat grafting is a possibility and she has previously discussed this with Dr. Marla Roe.  Recommend following up in 2 months for reevaluation and to discuss this.  Pictures were obtained of the patient and placed in the chart with the patient's or guardian's permission.

## 2020-11-03 NOTE — Telephone Encounter (Signed)
Last OV with Dawn 

## 2020-11-04 NOTE — Telephone Encounter (Signed)
Please advise 

## 2020-11-06 ENCOUNTER — Other Ambulatory Visit (INDEPENDENT_AMBULATORY_CARE_PROVIDER_SITE_OTHER): Payer: Self-pay | Admitting: Family Medicine

## 2020-11-06 DIAGNOSIS — E1159 Type 2 diabetes mellitus with other circulatory complications: Secondary | ICD-10-CM

## 2020-11-09 NOTE — Telephone Encounter (Signed)
LAST APPOINTMENT DATE: 08/25/200 NEXT APPOINTMENT DATE: 11/18/2020   [id number] [facility] [address] [place]Phone: [phone]: [address] [address] [place] [address]Phone: [phone]: [phone] Patient is requesting a refill of the following medications: Requested Prescriptions   Pending Prescriptions Disp Refills   valsartan (DIOVAN) 40 MG tablet [Pharmacy Med Name: VALSARTAN 40 MG TABLET] 30 tablet 0    Sig: TAKE 1 TABLET BY MOUTH EVERY DAY    Date last filled: 10/07/2020 [facility] Lab Results  Component Value Date   HGBA1C 5.8 (H) 10/28/2020   HGBA1C 5.8 (H) 03/17/2020   HGBA1C 6.5 (H) 09/09/2019   Lab Results  Component Value Date   LDLCALC 77 10/28/2020   CREATININE 1.10 (H) 10/28/2020   Lab Results  Component Value Date   VD25OH 47.9 10/28/2020   VD25OH 52.2 03/17/2020   VD25OH 30.9 09/09/2019    BP Readings from Last 3 Encounters:  10/28/20 118/82  10/07/20 124/81  09/13/20 130/78

## 2020-11-09 NOTE — Telephone Encounter (Signed)
Vanessa Dean 

## 2020-11-18 ENCOUNTER — Ambulatory Visit (INDEPENDENT_AMBULATORY_CARE_PROVIDER_SITE_OTHER): Payer: BC Managed Care – PPO | Admitting: Family Medicine

## 2020-11-25 ENCOUNTER — Encounter (INDEPENDENT_AMBULATORY_CARE_PROVIDER_SITE_OTHER): Payer: Self-pay

## 2020-11-25 NOTE — Progress Notes (Deleted)
Opened in error

## 2020-11-30 ENCOUNTER — Other Ambulatory Visit: Payer: Self-pay

## 2020-11-30 ENCOUNTER — Ambulatory Visit (INDEPENDENT_AMBULATORY_CARE_PROVIDER_SITE_OTHER): Payer: BC Managed Care – PPO | Admitting: Family Medicine

## 2020-11-30 ENCOUNTER — Encounter (INDEPENDENT_AMBULATORY_CARE_PROVIDER_SITE_OTHER): Payer: Self-pay | Admitting: Family Medicine

## 2020-11-30 VITALS — BP 130/86 | HR 82 | Temp 97.9°F | Ht 65.0 in | Wt 258.0 lb

## 2020-11-30 DIAGNOSIS — E1169 Type 2 diabetes mellitus with other specified complication: Secondary | ICD-10-CM

## 2020-11-30 DIAGNOSIS — E1159 Type 2 diabetes mellitus with other circulatory complications: Secondary | ICD-10-CM | POA: Diagnosis not present

## 2020-11-30 DIAGNOSIS — I152 Hypertension secondary to endocrine disorders: Secondary | ICD-10-CM

## 2020-11-30 DIAGNOSIS — Z6841 Body Mass Index (BMI) 40.0 and over, adult: Secondary | ICD-10-CM

## 2020-11-30 MED ORDER — SEMAGLUTIDE (1 MG/DOSE) 4 MG/3ML ~~LOC~~ SOPN: 1.0000 mg | PEN_INJECTOR | SUBCUTANEOUS | 0 refills | Status: DC

## 2020-11-30 MED ORDER — VALSARTAN 40 MG PO TABS: 40.0000 mg | ORAL_TABLET | Freq: Every day | ORAL | 0 refills | Status: DC

## 2020-11-30 NOTE — Progress Notes (Signed)
Chief Complaint:   OBESITY Vanessa Dean is here to discuss her progress with her obesity treatment plan along with follow-up of her obesity related diagnoses. Vanessa Dean is on the Category 1 Plan plus 100 calories and states she is following her eating plan approximately 50% of the time. Vanessa Dean states she is doing 0 minutes 0 times per week.  Today's visit was #: 20 Starting weight: 274 lbs Starting date: 09/09/2019 Today's weight: 258 lbs Today's date: 11/30/2020 Total lbs lost to date: 16 lbs Total lbs lost since last in-office visit: 0  Interim History: Vanessa Dean finds it a challenge to eat 6 ounces of meat at supper. However, she does feel Ozempic is helpful overall to reduce overeating. She tends to snack late at night. She says she needs to drink more water.  Subjective:   1. Hypertension associated with type 2 diabetes mellitus ([place]) Vanessa Dean's hypertension is well controlled on HCTZ and Valsartan.   BP Readings from Last 3 Encounters:  11/30/20 130/86  10/28/20 118/82  10/07/20 124/81    2. Type 2 diabetes mellitus with other specified complication, without long-term current use of insulin (HCC) Vanessa Dean's diabetes mellitus is well controlled. Her last A1C was 5.8. She is on Ozempic 1 mg.  Lab Results  Component Value Date   HGBA1C 5.8 (H) 10/28/2020   HGBA1C 5.8 (H) 03/17/2020   HGBA1C 6.5 (H) 09/09/2019   Lab Results  Component Value Date   LDLCALC 77 10/28/2020   CREATININE 1.10 (H) 10/28/2020   Lab Results  Component Value Date   INSULIN 12.3 03/17/2020   INSULIN 15.4 09/09/2019   Assessment/Plan:   1. Hypertension associated with type 2 diabetes mellitus (HCC) We will refill Valsartan 40 mg for 3 months with no refills. Vanessa Dean will continue taking HCTZ. We will watch for signs of hypotension as she continues her lifestyle modifications.  - valsartan (DIOVAN) 40 MG tablet; Take 1 tablet (40 mg total) by mouth daily.  Dispense: 90 tablet; Refill: 0  2. Type 2 diabetes  mellitus with other specified complication, without long-term current use of insulin (HCC) We will refill Ozempic 1 mg weekly.    - Semaglutide, 1 MG/DOSE, 4 MG/3ML SOPN; Inject 1 mg as directed once a week.  Dispense: 3 mL; Refill: 0  3. Obesity: BMI 42.93 Vanessa Dean is currently in the action stage of change. As such, her goal is to continue with weight loss efforts. She has agreed to the Category 1 Plan plus 100 calories.   Vanessa Dean will keep extra calories < than 200.  Exercise goals: All adults should avoid inactivity. Some physical activity is better than none, and adults who participate in any amount of physical activity gain some health benefits.  Behavioral modification strategies: increasing lean protein intake.  Vanessa Dean has agreed to follow-up with our clinic in 3-4 weeks.  Objective:   Blood pressure 130/86, pulse 82, temperature 97.9 F (36.6 C), height 5' 5"  (1.651 m), weight 258 lb (117 kg), SpO2 96 %. Body mass index is 42.93 kg/m.  General: Cooperative, alert, well developed, in no acute distress. HEENT: Conjunctivae and lids unremarkable. Cardiovascular: Regular Vanessa Dean.  Lungs: Normal work of breathing. Neurologic: No focal deficits.   Lab Results  Component Value Date   CREATININE 1.10 (H) 10/28/2020   BUN 14 10/28/2020   NA 143 10/28/2020   K 4.1 10/28/2020   CL 100 10/28/2020   CO2 28 10/28/2020   Lab Results  Component Value Date   ALT 20 10/28/2020  AST 21 10/28/2020   ALKPHOS 135 (H) 10/28/2020   BILITOT 0.5 10/28/2020   Lab Results  Component Value Date   HGBA1C 5.8 (H) 10/28/2020   HGBA1C 5.8 (H) 03/17/2020   HGBA1C 6.5 (H) 09/09/2019   Lab Results  Component Value Date   INSULIN 12.3 03/17/2020   INSULIN 15.4 09/09/2019   Lab Results  Component Value Date   TSH 0.688 09/09/2019   Lab Results  Component Value Date   CHOL 155 10/28/2020   HDL 60 10/28/2020   LDLCALC 77 10/28/2020   TRIG 99 10/28/2020   CHOLHDL 2.6 03/17/2020   Lab  Results  Component Value Date   VD25OH 47.9 10/28/2020   VD25OH 52.2 03/17/2020   VD25OH 30.9 09/09/2019   Lab Results  Component Value Date   WBC 6.8 09/09/2019   HGB 12.2 09/09/2019   HCT 37.0 09/09/2019   MCV 91 09/09/2019   PLT 311 09/09/2019   No results found for: IRON, TIBC, FERRITIN  Attestation Statements:   Reviewed by clinician on day of visit: allergies, medications, problem list, medical history, surgical history, family history, social history, and previous encounter notes.   I, Lizbeth Bark, RMA, am acting as Location manager for [employer], [place].   I have reviewed the above documentation for accuracy and completeness, and I agree with the above. -  Georgianne Fick, FNP

## 2020-11-30 NOTE — Progress Notes (Signed)
This encounter was created in error - please disregard.

## 2020-12-28 ENCOUNTER — Other Ambulatory Visit: Payer: Self-pay

## 2020-12-28 ENCOUNTER — Encounter (INDEPENDENT_AMBULATORY_CARE_PROVIDER_SITE_OTHER): Payer: Self-pay | Admitting: Family Medicine

## 2020-12-28 ENCOUNTER — Ambulatory Visit (INDEPENDENT_AMBULATORY_CARE_PROVIDER_SITE_OTHER): Payer: BC Managed Care – PPO | Admitting: Family Medicine

## 2020-12-28 VITALS — BP 122/78 | HR 79 | Temp 98.0°F | Ht 65.0 in | Wt 257.0 lb

## 2020-12-28 DIAGNOSIS — E1169 Type 2 diabetes mellitus with other specified complication: Secondary | ICD-10-CM | POA: Diagnosis not present

## 2020-12-28 DIAGNOSIS — E1159 Type 2 diabetes mellitus with other circulatory complications: Secondary | ICD-10-CM | POA: Diagnosis not present

## 2020-12-28 DIAGNOSIS — I152 Hypertension secondary to endocrine disorders: Secondary | ICD-10-CM | POA: Diagnosis not present

## 2020-12-28 DIAGNOSIS — Z6841 Body Mass Index (BMI) 40.0 and over, adult: Secondary | ICD-10-CM

## 2020-12-28 MED ORDER — SEMAGLUTIDE (2 MG/DOSE) 8 MG/3ML ~~LOC~~ SOPN
2.0000 mg | PEN_INJECTOR | SUBCUTANEOUS | 0 refills | Status: DC
Start: 2020-12-28 — End: 2021-02-07

## 2020-12-28 NOTE — Progress Notes (Signed)
Chief Complaint:   OBESITY Vanessa Dean is here to discuss her progress with her obesity treatment plan along with follow-up of her obesity related diagnoses. [name] is on the Category 1 Plan +100 calories and states she is following her eating plan approximately 65% of the time. [name] states she is not exercising regularly at this time.  Today's visit was #: 21 Starting weight: 274 lbs Starting date: 09/09/2019 Today's weight: 257 lbs Today's date: 12/28/2020 Total lbs lost to date: 17 lbs Total lbs lost since last in-office visit: 1 lb  Interim History: Vanessa Dean feels she needs to get more rest and this affects her eating. 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  She needs to drink more water.    Subjective:   1. Type 2 diabetes mellitus with other specified complication, without long-term current use of insulin (HCC) Last A1c was 5.8.  She is on Ozempic 1 mg.  She notes increased snacking late at night.  Denies constipation/nausea.  Lab Results  Component Value Date   HGBA1C 5.8 (H) 10/28/2020   HGBA1C 5.8 (H) 03/17/2020   HGBA1C 6.5 (H) 09/09/2019   Lab Results  Component Value Date   LDLCALC 77 10/28/2020   CREATININE 1.10 (H) 10/28/2020   Lab Results  Component Value Date   INSULIN 12.3 03/17/2020   INSULIN 15.4 09/09/2019   2. Hypertension associated with type 2 diabetes mellitus ([place]) Well-controlled.  On HCTZ and valsartan.  BP Readings from Last 3 Encounters:  12/28/20 122/78  11/30/20 130/86  10/28/20 118/82   Assessment/Plan:   1. Type 2 diabetes mellitus with other specified complication, without long-term current use of insulin (HCC) Increase dose of Ozempic to 2 mg subcutaneously weekly, as per below.  - Increase Semaglutide, 2 MG/DOSE, 8 MG/3ML SOPN; Inject 2 mg as directed once a week.  Dispense: 3 mL; Refill: 0  2. Hypertension associated with type 2 diabetes mellitus (HCC) Continue HCTZ  and valsartan.  3. Obesity: BMI 42.77  Vanessa Dean is currently in the action stage of change. As such, her goal is to continue with weight loss efforts. She has agreed to the Category 1 Plan +100 calories.   She will work on getting in bed earlier.  Exercise goals: All adults should avoid inactivity. Some physical activity is better than none, and adults who participate in any amount of physical activity gain some health benefits.  Behavioral modification strategies: decreasing simple carbohydrates.  Vanessa Dean has agreed to follow-up with our clinic in 3-4 weeks. Objective:   Blood pressure 122/78, pulse 79, temperature 98 F (36.7 C), height 5' 5"  (1.651 m), weight 257 lb (116.6 kg), SpO2 96 %. Body mass index is 42.77 kg/m.  General: Cooperative, alert, well developed, in no acute distress. HEENT: Conjunctivae and lids unremarkable. Cardiovascular: Regular rhythm.  Lungs: Normal work of breathing. Neurologic: No focal deficits.   Lab Results  Component Value Date   CREATININE 1.10 (H) 10/28/2020   BUN 14 10/28/2020   NA 143 10/28/2020   K 4.1 10/28/2020   CL 100 10/28/2020   CO2 28 10/28/2020   Lab Results  Component Value Date   ALT 20 10/28/2020   AST 21 10/28/2020   ALKPHOS 135 (H) 10/28/2020   BILITOT 0.5 10/28/2020   Lab Results  Component Value Date   HGBA1C 5.8 (H) 10/28/2020   HGBA1C 5.8 (H) 03/17/2020   HGBA1C 6.5 (H) 09/09/2019   Lab Results  Component  Value Date   INSULIN 12.3 03/17/2020   INSULIN 15.4 09/09/2019   Lab Results  Component Value Date   TSH 0.688 09/09/2019   Lab Results  Component Value Date   CHOL 155 10/28/2020   HDL 60 10/28/2020   LDLCALC 77 10/28/2020   TRIG 99 10/28/2020   CHOLHDL 2.6 03/17/2020   Lab Results  Component Value Date   VD25OH 47.9 10/28/2020   VD25OH 52.2 03/17/2020   VD25OH 30.9 09/09/2019   Lab Results  Component Value Date   WBC 6.8 09/09/2019   HGB 12.2 09/09/2019   HCT 37.0 09/09/2019   MCV 91  09/09/2019   PLT 311 09/09/2019   Attestation Statements:   Reviewed by clinician on day of visit: allergies, medications, problem list, medical history, surgical history, family history, social history, and previous encounter notes.  I, Water quality scientist, CMA, am acting as Location manager for [employer], [place].  I have reviewed the above documentation for accuracy and completeness, and I agree with the above. -  Georgianne Fick, FNP

## 2021-01-07 ENCOUNTER — Other Ambulatory Visit: Payer: Self-pay

## 2021-01-07 ENCOUNTER — Ambulatory Visit (INDEPENDENT_AMBULATORY_CARE_PROVIDER_SITE_OTHER): Payer: BC Managed Care – PPO | Admitting: Plastic Surgery

## 2021-01-07 ENCOUNTER — Encounter: Payer: Self-pay | Admitting: Plastic Surgery

## 2021-01-07 DIAGNOSIS — N62 Hypertrophy of breast: Secondary | ICD-10-CM | POA: Diagnosis not present

## 2021-01-07 NOTE — Progress Notes (Signed)
   Subjective:    Patient ID: Vanessa Dean, female    DOB: 03-04-1966, 55 y.o.   MRN: [id number]  The patient is a 55 year old female who underwent a bilateral breast reduction 2 years ago.  She had over 1000 g removed from each breast.  She has a little bit of dimpling on the lateral aspect of the left areola.  We did a scar release in the office.  It did improve but there is still some dimpling.  At this time I think that it is more lack of volume than scarring that is tethering it.  Patient is interested in having something done.  There is concern about the ability to keep it clean.   Review of Systems  Constitutional: Negative.   Eyes: Negative.   Respiratory: Negative.    Cardiovascular: Negative.   Gastrointestinal: Negative.   Endocrine: Negative.   Genitourinary: Negative.   Hematological: Negative.   Psychiatric/Behavioral: Negative.        Objective:   Physical Exam Vitals and nursing note reviewed.  Constitutional:      Appearance: Normal appearance.  HENT:     Head: Normocephalic and atraumatic.  Cardiovascular:     Rate and Rhythm: Normal rate.     Pulses: Normal pulses.  Pulmonary:     Effort: Pulmonary effort is normal.  Abdominal:     Palpations: Abdomen is soft. There is no mass.  Musculoskeletal:        General: No swelling or deformity.  Neurological:     Mental Status: She is alert and oriented to person, place, and time.  Psychiatric:        Mood and Affect: Mood normal.        Behavior: Behavior normal.        Thought Content: Thought content normal.       Assessment & Plan:     ICD-10-CM   1. Symptomatic mammary hypertrophy  N62       We discussed options for fat filling.  The patient is interested and would like to see if that could be done  Pictures were obtained of the patient and placed in the chart with the patient's or guardian's permission.

## 2021-01-17 ENCOUNTER — Encounter (INDEPENDENT_AMBULATORY_CARE_PROVIDER_SITE_OTHER): Payer: Self-pay

## 2021-01-17 ENCOUNTER — Ambulatory Visit (INDEPENDENT_AMBULATORY_CARE_PROVIDER_SITE_OTHER): Payer: BC Managed Care – PPO | Admitting: Family Medicine

## 2021-02-02 ENCOUNTER — Ambulatory Visit (INDEPENDENT_AMBULATORY_CARE_PROVIDER_SITE_OTHER): Payer: BC Managed Care – PPO | Admitting: Family Medicine

## 2021-02-02 ENCOUNTER — Encounter (INDEPENDENT_AMBULATORY_CARE_PROVIDER_SITE_OTHER): Payer: Self-pay

## 2021-02-07 ENCOUNTER — Other Ambulatory Visit (INDEPENDENT_AMBULATORY_CARE_PROVIDER_SITE_OTHER): Payer: Self-pay | Admitting: Family Medicine

## 2021-02-07 DIAGNOSIS — E1169 Type 2 diabetes mellitus with other specified complication: Secondary | ICD-10-CM

## 2021-02-07 NOTE — Telephone Encounter (Signed)
LAST APPOINTMENT DATE: 12/28/20 NEXT APPOINTMENT DATE: 02/16/21   [id number] [facility] [address]Phone: [phone]: [phone] [facility] [address] [address] [address]Phone: [phone]: [phone] Patient is requesting a refill of the following medications: Requested Prescriptions   Pending Prescriptions Disp Refills   OZEMPIC, 2 MG/DOSE, 8 MG/3ML SOPN [Pharmacy Med Name: OZEMPIC 2 MG/DOSE (8 MG/3 ML)]      Sig: INJECT 2 MG AS DIRECTED ONCE A WEEK.    Date last filled: 12/28/20 Previously prescribed by DJake Bathe FPN  Lab Results  Component Value Date   HGBA1C 5.8 (H) 10/28/2020   HGBA1C 5.8 (H) 03/17/2020   HGBA1C 6.5 (H) 09/09/2019   Lab Results  Component Value Date   LDLCALC 77 10/28/2020   CREATININE 1.10 (H) 10/28/2020   Lab Results  Component Value Date   VD25OH 47.9 10/28/2020   VD25OH 52.2 03/17/2020   VD25OH 30.9 09/09/2019    BP Readings from Last 3 Encounters:  12/28/20 122/78  11/30/20 130/86  10/28/20 118/82

## 2021-02-07 NOTE — Telephone Encounter (Signed)
Pt last seen by Dawn Whitmire, FNP.  

## 2021-02-16 ENCOUNTER — Encounter (INDEPENDENT_AMBULATORY_CARE_PROVIDER_SITE_OTHER): Payer: Self-pay | Admitting: Physician Assistant

## 2021-02-16 ENCOUNTER — Other Ambulatory Visit: Payer: Self-pay

## 2021-02-16 ENCOUNTER — Ambulatory Visit (INDEPENDENT_AMBULATORY_CARE_PROVIDER_SITE_OTHER): Payer: BC Managed Care – PPO | Admitting: Physician Assistant

## 2021-02-16 VITALS — BP 109/75 | HR 79 | Temp 98.1°F | Ht 65.0 in | Wt 255.0 lb

## 2021-02-16 DIAGNOSIS — Z6841 Body Mass Index (BMI) 40.0 and over, adult: Secondary | ICD-10-CM | POA: Diagnosis not present

## 2021-02-16 DIAGNOSIS — E1169 Type 2 diabetes mellitus with other specified complication: Secondary | ICD-10-CM

## 2021-02-16 NOTE — Progress Notes (Signed)
Chief Complaint:   OBESITY Vanessa Dean is here to discuss her progress with her obesity treatment plan along with follow-up of her obesity related diagnoses. Vanessa Dean is on the Category 1 Plan + 100 calories and states she is following her eating plan approximately 50% of the time. Vanessa Dean states she is doing 0 minutes 0 times per week.  Today's visit was #: 22 Starting weight: 274 lbs Starting date: 09/09/2019 Today's weight: 255 lbs Today's date: 02/16/2021 Total lbs lost to date: 19 Total lbs lost since last in-office visit: 2  Interim History: Vanessa Dean did well with weight loss. She is eating 2 eggs for breakfast and 1-2 pieces of bacon. Lunch is a sandwich, and dinner varies. She struggles some days to get all of her protein in due to being full from Vanessa Dean but overall she does well.  Subjective:   1. Type 2 diabetes mellitus with other specified complication, without long-term current use of insulin (HCC) Vanessa Dean is on Ozempic 2 mg, and he appetite is well controlled.  Assessment/Plan:   1. Type 2 diabetes mellitus with other specified complication, without long-term current use of insulin (HCC) Vanessa Dean will continue Ozempic 2 mg, and will continue her meal plan. Good blood sugar control is important to decrease the likelihood of diabetic complications such as nephropathy, neuropathy, limb loss, blindness, coronary artery disease, and death. Intensive lifestyle modification including diet, exercise and weight loss are the first line of treatment for diabetes.   2. Obesity: BMI 42.43 Vanessa Dean is currently in the action stage of change. As such, her goal is to continue with weight loss efforts. She has agreed to the Category 1 Plan + 100 calories.   Exercise goals: No exercise has been prescribed at this time.  Behavioral modification strategies: increasing lean protein intake and meal planning and cooking strategies.  Vanessa Dean has agreed to follow-up with our clinic in 3 to 4 weeks. She was  informed of the importance of frequent follow-up visits to maximize her success with intensive lifestyle modifications for her multiple health conditions.   Objective:   Blood pressure 109/75, pulse 79, temperature 98.1 F (36.7 C), height 5' 5"  (1.651 m), weight 255 lb (115.7 kg), SpO2 98 %. Body mass index is 42.43 kg/m.  General: Cooperative, alert, well developed, in no acute distress. HEENT: Conjunctivae and lids unremarkable. Cardiovascular: Regular rhythm.  Lungs: Normal work of breathing. Neurologic: No focal deficits.   Lab Results  Component Value Date   CREATININE 1.10 (H) 10/28/2020   BUN 14 10/28/2020   NA 143 10/28/2020   K 4.1 10/28/2020   CL 100 10/28/2020   CO2 28 10/28/2020   Lab Results  Component Value Date   ALT 20 10/28/2020   AST 21 10/28/2020   ALKPHOS 135 (H) 10/28/2020   BILITOT 0.5 10/28/2020   Lab Results  Component Value Date   HGBA1C 5.8 (H) 10/28/2020   HGBA1C 5.8 (H) 03/17/2020   HGBA1C 6.5 (H) 09/09/2019   Lab Results  Component Value Date   INSULIN 12.3 03/17/2020   INSULIN 15.4 09/09/2019   Lab Results  Component Value Date   TSH 0.688 09/09/2019   Lab Results  Component Value Date   CHOL 155 10/28/2020   HDL 60 10/28/2020   LDLCALC 77 10/28/2020   TRIG 99 10/28/2020   CHOLHDL 2.6 03/17/2020   Lab Results  Component Value Date   VD25OH 47.9 10/28/2020   VD25OH 52.2 03/17/2020   VD25OH 30.9 09/09/2019   Lab Results  Component Value Date   WBC 6.8 09/09/2019   HGB 12.2 09/09/2019   HCT 37.0 09/09/2019   MCV 91 09/09/2019   PLT 311 09/09/2019   No results found for: IRON, TIBC, FERRITIN  Attestation Statements:   Reviewed by clinician on day of visit: allergies, medications, problem list, medical history, surgical history, family history, social history, and previous encounter notes.  Time spent on visit including pre-visit chart review and post-visit care and charting was 30 minutes.    Vanessa Dean, am  acting as transcriptionist for [employer], PA-C.  I have reviewed the above documentation for accuracy and completeness, and I agree with the above. Vanessa Potash, PA-C

## 2021-03-04 LAB — BASIC METABOLIC PANEL
BUN: 15 (ref 4–21)
CO2: 31 — AB (ref 13–22)
Chloride: 102 (ref 99–108)
Creatinine: 1.1 (ref 0.5–1.1)
Glucose: 75
Potassium: 3.8 (ref 3.4–5.3)
Sodium: 142 (ref 137–147)

## 2021-03-04 LAB — HEPATIC FUNCTION PANEL
ALT: 18 (ref 7–35)
AST: 22 (ref 13–35)
Alkaline Phosphatase: 110 (ref 25–125)
Bilirubin, Total: 0.6

## 2021-03-04 LAB — COMPREHENSIVE METABOLIC PANEL
Albumin: 4.1 (ref 3.5–5.0)
Calcium: 9.6 (ref 8.7–10.7)
GFR calc Af Amer: 59

## 2021-03-04 LAB — CBC AND DIFFERENTIAL
HCT: 39 (ref 36–46)
Hemoglobin: 13.3 (ref 12.0–16.0)
Neutrophils Absolute: 56.4
Platelets: 275 (ref 150–399)
WBC: 7.4

## 2021-03-04 LAB — CBC: RBC: 4.38 (ref 3.87–5.11)

## 2021-03-04 LAB — VITAMIN D 25 HYDROXY (VIT D DEFICIENCY, FRACTURES): Vit D, 25-Hydroxy: 46.6

## 2021-03-04 LAB — LIPID PANEL
Cholesterol: 151 (ref 0–200)
HDL: 58 (ref 35–70)
LDL Cholesterol: 72
Triglycerides: 119 (ref 40–160)

## 2021-03-04 LAB — MICROALBUMIN, URINE: Microalb, Ur: 1.88

## 2021-03-04 LAB — TSH: TSH: 0.73 (ref 0.41–5.90)

## 2021-03-04 LAB — MICROALBUMIN / CREATININE URINE RATIO: Microalb Creat Ratio: 5.1

## 2021-03-04 LAB — HEMOGLOBIN A1C: Hemoglobin A1C: 5.7

## 2021-03-07 IMAGING — MR MR MRA HEAD W/O CM
1 series · 23 of 48 positions shown · non-contrast
Comparison: No pertinent prior studies available for comparison.

CLINICAL DATA: Pulsatile tinnitus, left ear. Rule out stenosis of
artery. Additional history provided by technologist: Left-sided
pulsatile tinnitus for 1 month, patient reports no trauma or prior
surgery.

EXAM:
MRA HEAD WITHOUT CONTRAST
TECHNIQUE: Angiographic images of the Circle of Willis were obtained using MRA
technique without intravenous contrast.

[Series 3: tof_3d_multi-slab · axial · 0.7mm · 0.37mm/px · z∈[-82,+29]mm · 23 of 169 slices shown]
[im 1/169]
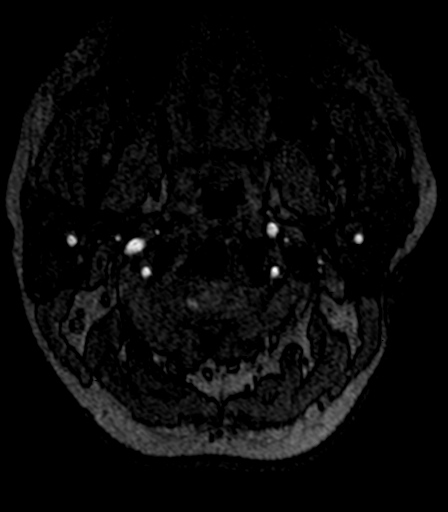
[im 4/169]
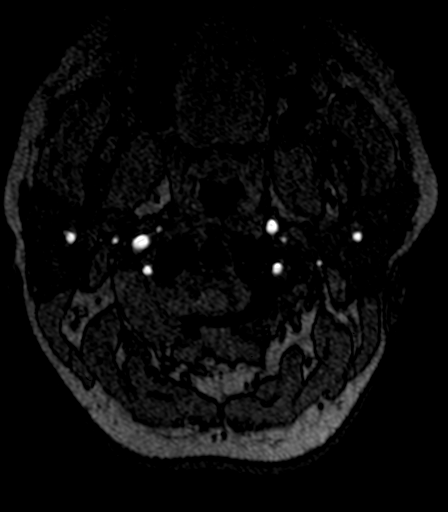
[im 8/169]
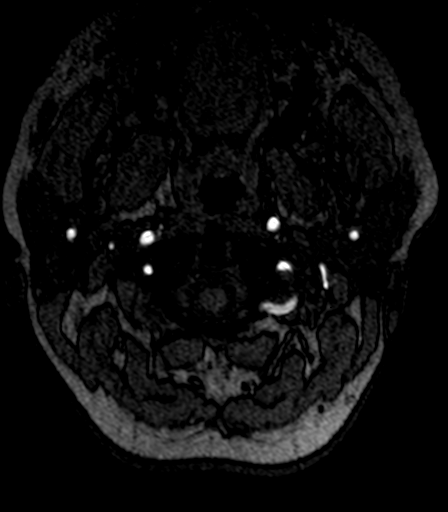
[im 11/169]
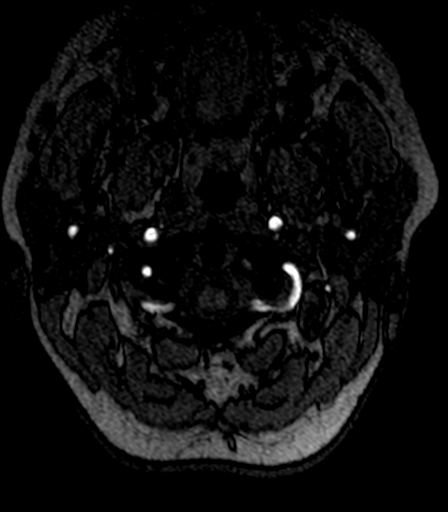
[im 15/169]
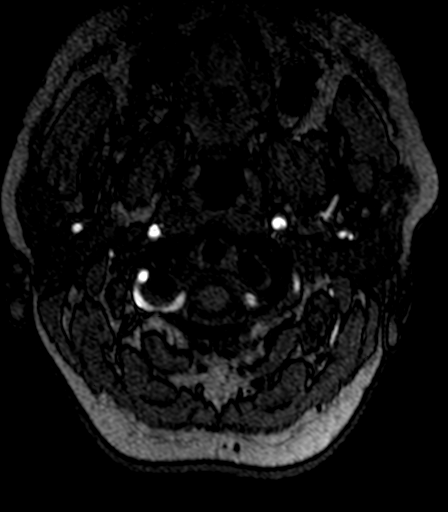
[im 18/169]
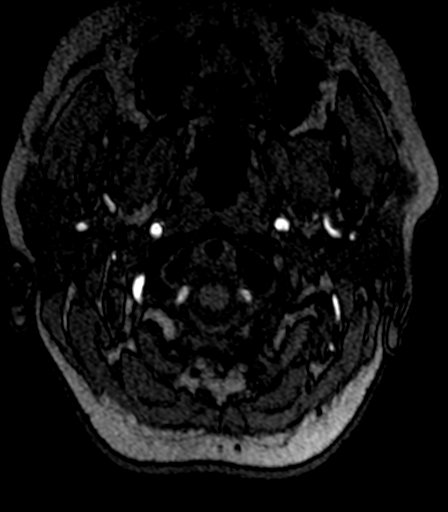
[im 22/169]
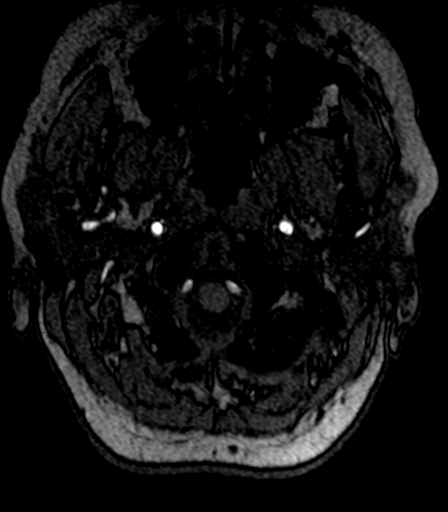
[im 26/169]
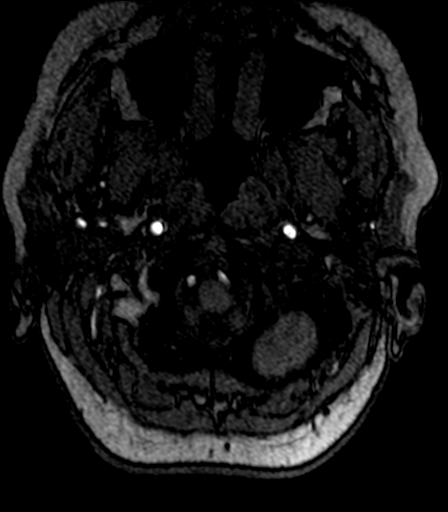
[im 29/169]
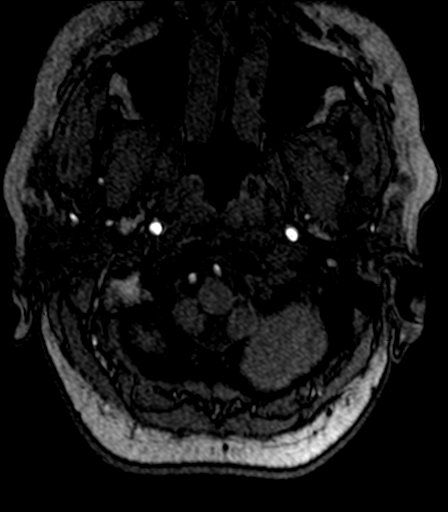
[im 33/169]
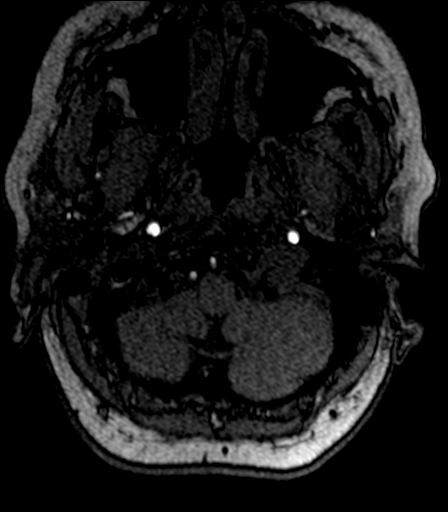
[im 36/169]
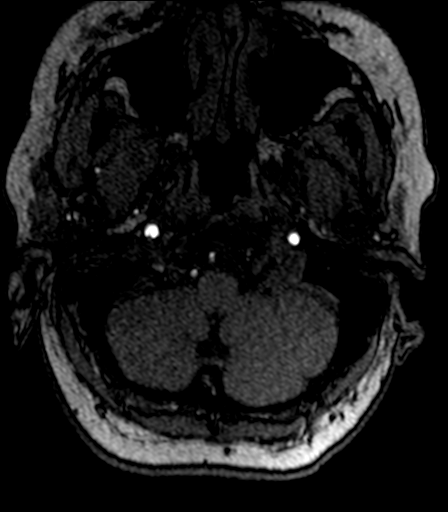
[im 40/169]
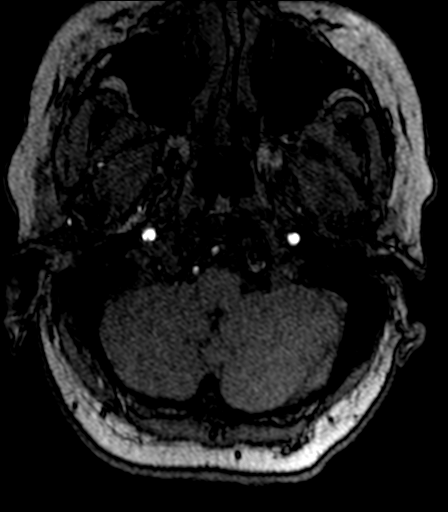
[im 43/169]
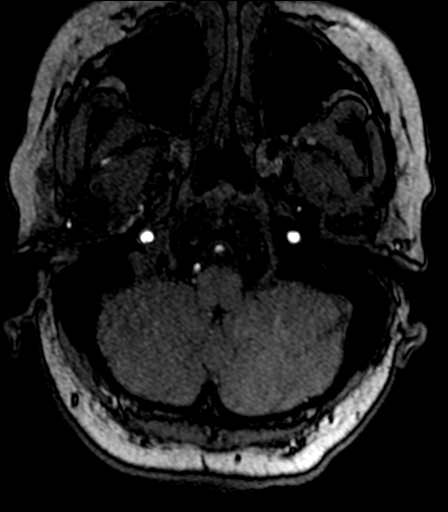
[im 47/169]
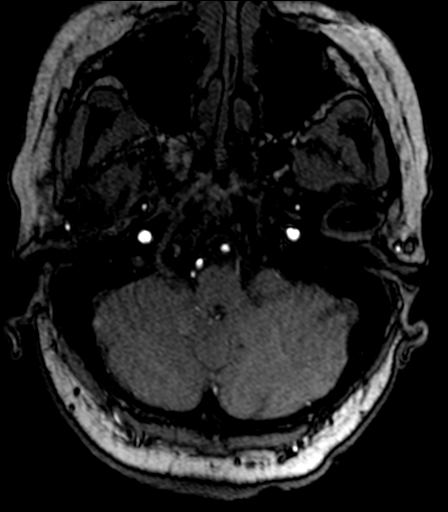
[im 51/169]
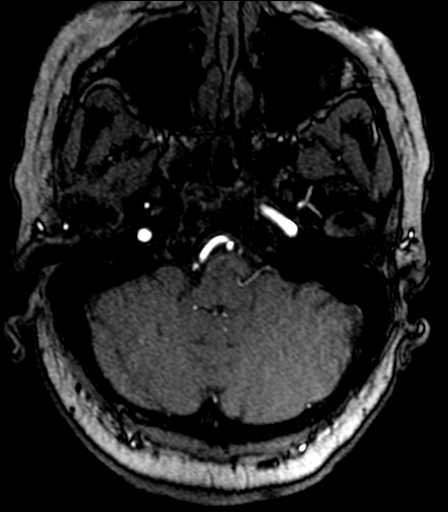
[im 54/169]
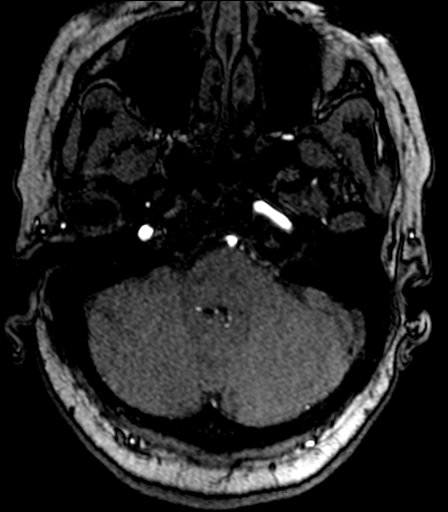
[im 76/169]
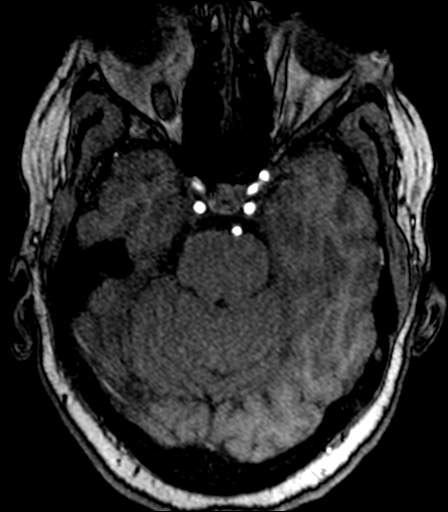
[im 86/169]
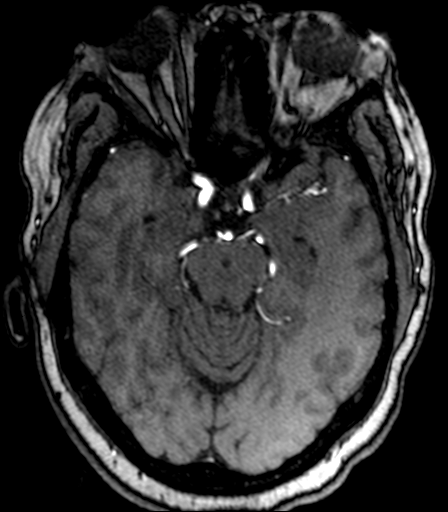
[im 97/169]
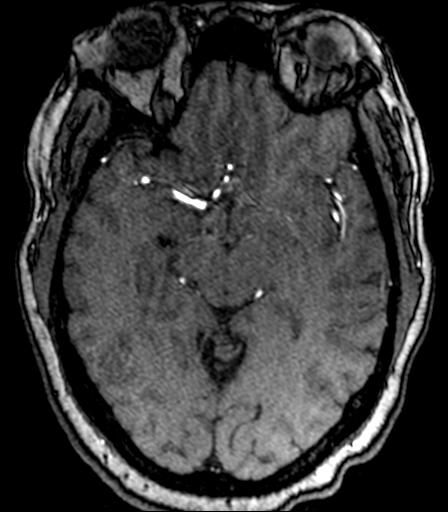
[im 118/169]
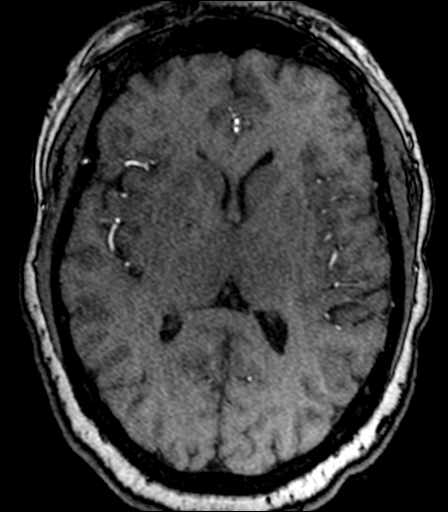
[im 140/169]
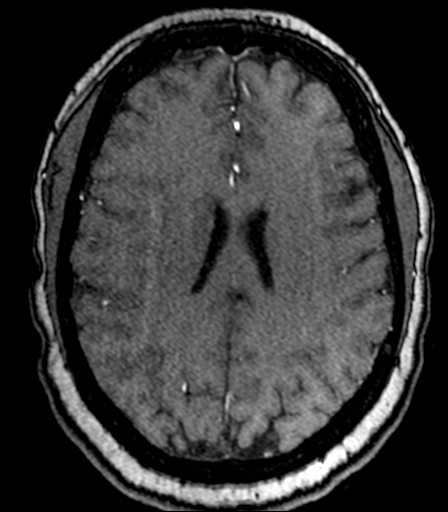
[im 143/169]
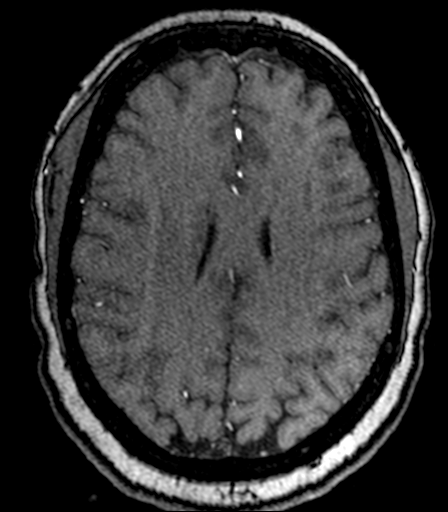
[im 161/169]
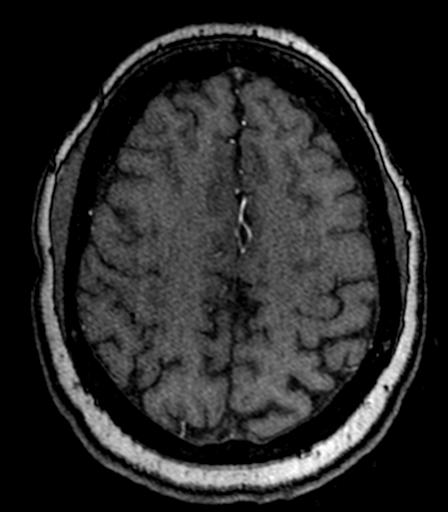

[23 of 48 positions shown; findings below may reference images not displayed]

FINDINGS: The intracranial internal carotid arteries are patent without
significant stenosis. The M1 middle cerebral arteries are patent
without significant stenosis. No M2 proximal branch occlusion or
high-grade proximal stenosis is identified. The anterior cerebral
arteries are patent bilaterally without high-grade proximal
stenosis.

There is a 2 mm inferiorly projecting vascular protrusion arising
from the cavernous left ICA which may reflect a small aneurysm
(series 8, image 1).

The intracranial vertebral arteries are patent without significant
proximal stenosis, as is the basilar artery. The bilateral posterior
cerebral arteries are patent without significant proximal stenosis.
A sizable right posterior communicating artery is present.
IMPRESSION: No intracranial large vessel occlusion or proximal high-grade
arterial stenosis.

2 mm vascular protrusion arising from the cavernous left ICA, which
may reflect a small aneurysm.

## 2021-03-08 ENCOUNTER — Other Ambulatory Visit (INDEPENDENT_AMBULATORY_CARE_PROVIDER_SITE_OTHER): Payer: Self-pay | Admitting: Family Medicine

## 2021-03-08 DIAGNOSIS — I152 Hypertension secondary to endocrine disorders: Secondary | ICD-10-CM

## 2021-03-08 DIAGNOSIS — E1159 Type 2 diabetes mellitus with other circulatory complications: Secondary | ICD-10-CM

## 2021-03-11 ENCOUNTER — Other Ambulatory Visit (INDEPENDENT_AMBULATORY_CARE_PROVIDER_SITE_OTHER): Payer: Self-pay | Admitting: Family Medicine

## 2021-03-11 DIAGNOSIS — E1169 Type 2 diabetes mellitus with other specified complication: Secondary | ICD-10-CM

## 2021-03-15 ENCOUNTER — Other Ambulatory Visit (INDEPENDENT_AMBULATORY_CARE_PROVIDER_SITE_OTHER): Payer: Self-pay

## 2021-03-29 ENCOUNTER — Encounter (INDEPENDENT_AMBULATORY_CARE_PROVIDER_SITE_OTHER): Payer: Self-pay

## 2021-03-30 ENCOUNTER — Other Ambulatory Visit (INDEPENDENT_AMBULATORY_CARE_PROVIDER_SITE_OTHER): Payer: Self-pay | Admitting: Family Medicine

## 2021-03-30 DIAGNOSIS — E1169 Type 2 diabetes mellitus with other specified complication: Secondary | ICD-10-CM

## 2021-03-30 MED ORDER — OZEMPIC (2 MG/DOSE) 8 MG/3ML ~~LOC~~ SOPN: 2.0000 mg | PEN_INJECTOR | SUBCUTANEOUS | 0 refills | Status: DC

## 2021-03-30 NOTE — Telephone Encounter (Signed)
LAST APPOINTMENT DATE: 02/16/21 NEXT APPOINTMENT DATE: 04/05/21   [id number] [facility] [address]: [phone]: [address] [address]Phone: [phone]: [phone] Patient is requesting a refill of the following medications: Requested Prescriptions   Pending Prescriptions Disp Refills   Semaglutide, 2 MG/DOSE, (OZEMPIC, 2 MG/DOSE,) 8 MG/3ML SOPN 3 mL 0    Date last filled: 02/07/21 Previously prescribed by DR BLeafy Ro Lab Results  Component Value Date   HGBA1C 5.7 03/04/2021   HGBA1C 5.8 (H) 10/28/2020   HGBA1C 5.8 (H) 03/17/2020   Lab Results  Component Value Date   MICROALBUR 1.88 03/04/2021   LDLCALC 72 03/04/2021   CREATININE 1.1 03/04/2021   Lab Results  Component Value Date   VD25OH 46.6 03/04/2021   VD25OH 47.9 10/28/2020   VD25OH 52.2 03/17/2020    BP Readings from Last 3 Encounters:  02/16/21 109/75  12/28/20 122/78  11/30/20 130/86

## 2021-03-30 NOTE — Telephone Encounter (Signed)
Tracey 

## 2021-03-31 ENCOUNTER — Encounter (INDEPENDENT_AMBULATORY_CARE_PROVIDER_SITE_OTHER): Payer: Self-pay

## 2021-03-31 ENCOUNTER — Ambulatory Visit (INDEPENDENT_AMBULATORY_CARE_PROVIDER_SITE_OTHER): Payer: BC Managed Care – PPO | Admitting: Family Medicine

## 2021-04-05 ENCOUNTER — Encounter (INDEPENDENT_AMBULATORY_CARE_PROVIDER_SITE_OTHER): Payer: Self-pay | Admitting: Family Medicine

## 2021-04-05 ENCOUNTER — Other Ambulatory Visit: Payer: Self-pay

## 2021-04-05 ENCOUNTER — Ambulatory Visit (INDEPENDENT_AMBULATORY_CARE_PROVIDER_SITE_OTHER): Payer: BC Managed Care – PPO | Admitting: Family Medicine

## 2021-04-05 VITALS — BP 114/80 | HR 88 | Temp 98.3°F | Ht 65.0 in | Wt 253.0 lb

## 2021-04-05 DIAGNOSIS — E1169 Type 2 diabetes mellitus with other specified complication: Secondary | ICD-10-CM | POA: Diagnosis not present

## 2021-04-05 DIAGNOSIS — Z6841 Body Mass Index (BMI) 40.0 and over, adult: Secondary | ICD-10-CM

## 2021-04-05 DIAGNOSIS — E785 Hyperlipidemia, unspecified: Secondary | ICD-10-CM

## 2021-04-05 DIAGNOSIS — E669 Obesity, unspecified: Secondary | ICD-10-CM | POA: Diagnosis not present

## 2021-04-05 MED ORDER — OZEMPIC (2 MG/DOSE) 8 MG/3ML ~~LOC~~ SOPN: 2.0000 mg | PEN_INJECTOR | SUBCUTANEOUS | 0 refills | Status: DC

## 2021-04-05 MED ORDER — ATORVASTATIN CALCIUM 10 MG PO TABS: 10.0000 mg | ORAL_TABLET | Freq: Every evening | ORAL | 1 refills | Status: DC

## 2021-04-05 NOTE — Progress Notes (Signed)
Chief Complaint:   OBESITY Vanessa Dean is here to discuss her progress with her obesity treatment plan along with follow-up of her obesity related diagnoses. Vanessa Dean is on the Category 1 Plan plus 100 calories and states she is following her eating plan approximately 60% of the time. Vanessa Dean states she is doing 0 minutes 0 times per week.  Today's visit was #: 23 Starting weight: 274 lbs Starting date: 09/09/2019 Today's weight: 253 lbs Today's date: 04/05/2021 Total lbs lost to date: 21 lbs Total lbs lost since last in-office visit: 2 lbs  Interim History: Octa notes early satiety  with Ozempic. She is focusing on protein intake and trying to get in the prescribed protein.  She would like to increase her activity level.  Subjective:   1. Type 2 diabetes mellitus with other specified complication, without long-term current use of insulin (HCC) Vanessa Dean's Type 2 diabetes mellitus is well controlled. Her last A1C was 5.7. She is on Ozempic 2 mg. She denies side effects.  2. Hyperlipidemia associated with type 2 diabetes mellitus ([place]) Vanessa Dean's LDL is at goal 72. Her Triglycerides and HDL are within normal limits.   Assessment/Plan:   1. Type 2 diabetes mellitus with other specified complication, without long-term current use of insulin (HCC) We will refill Ozempic 2 mg weekly.  - Semaglutide, 2 MG/DOSE, (OZEMPIC, 2 MG/DOSE,) 8 MG/3ML SOPN; Inject 2 mg into the skin once a week.  Dispense: 3 mL; Refill: 0  2. Hyperlipidemia associated with type 2 diabetes mellitus (HCC)  We will refill  atorvastatin 10 mg for 3 months with no refills. - atorvastatin (LIPITOR) 10 MG tablet; Take 1 tablet (10 mg total) by mouth every evening.  Dispense: 90 tablet; Refill: 1  3. Obesity: BMI 42.1 Vanessa Dean is currently in the action stage of change. As such, her goal is to continue with weight loss efforts. She has agreed to the Category 1 Plan plus 100 calories.   Exercise goals:  Vanessa Dean was encouraged her to  consider  a step tracker and/or use her elliptical at home on weekends.  Behavioral modification strategies: increasing lean protein intake.  Vanessa Dean has agreed to follow-up with our clinic in 4-5 weeks with Mina Marble, NP or Abby Potash, PA-C.   Objective:   Blood pressure 114/80, pulse 88, temperature 98.3 F (36.8 C), height 5' 5"  (1.651 m), weight 253 lb (114.8 kg), SpO2 98 %. Body mass index is 42.1 kg/m.  General: Cooperative, alert, well developed, in no acute distress. HEENT: Conjunctivae and lids unremarkable. Cardiovascular: Regular rhythm.  Lungs: Normal work of breathing. Neurologic: No focal deficits.   Lab Results  Component Value Date   CREATININE 1.1 03/04/2021   BUN 15 03/04/2021   NA 142 03/04/2021   K 3.8 03/04/2021   CL 102 03/04/2021   CO2 31 (A) 03/04/2021   Lab Results  Component Value Date   ALT 18 03/04/2021   AST 22 03/04/2021   ALKPHOS 110 03/04/2021   BILITOT 0.5 10/28/2020   Lab Results  Component Value Date   HGBA1C 5.7 03/04/2021   HGBA1C 5.8 (H) 10/28/2020   HGBA1C 5.8 (H) 03/17/2020   HGBA1C 6.5 (H) 09/09/2019   Lab Results  Component Value Date   INSULIN 12.3 03/17/2020   INSULIN 15.4 09/09/2019   Lab Results  Component Value Date   TSH 0.73 03/04/2021   Lab Results  Component Value Date   CHOL 151 03/04/2021   HDL 58 03/04/2021   LDLCALC 72 03/04/2021  TRIG 119 03/04/2021   CHOLHDL 2.6 03/17/2020   Lab Results  Component Value Date   VD25OH 46.6 03/04/2021   VD25OH 47.9 10/28/2020   VD25OH 52.2 03/17/2020   Lab Results  Component Value Date   WBC 7.4 03/04/2021   HGB 13.3 03/04/2021   HCT 39 03/04/2021   MCV 91 09/09/2019   PLT 275 03/04/2021   No results found for: IRON, TIBC, FERRITIN  Attestation Statements:   Reviewed by clinician on day of visit: allergies, medications, problem list, medical history, surgical history, family history, social history, and previous encounter notes.  I, Lizbeth Bark,  RMA, am acting as Location manager for [employer], [place].  I have reviewed the above documentation for accuracy and completeness, and I agree with the above. -  Georgianne Fick, FNP

## 2021-04-25 ENCOUNTER — Other Ambulatory Visit (INDEPENDENT_AMBULATORY_CARE_PROVIDER_SITE_OTHER): Payer: Self-pay | Admitting: Family Medicine

## 2021-04-25 ENCOUNTER — Telehealth: Payer: Self-pay

## 2021-04-25 DIAGNOSIS — E1169 Type 2 diabetes mellitus with other specified complication: Secondary | ICD-10-CM

## 2021-04-25 NOTE — Telephone Encounter (Signed)
Returned patients call, LMVM. Will call her back after I confirm with Dr. Marla Roe details regarding the office surgery on 04/29/2021 .

## 2021-04-25 NOTE — Telephone Encounter (Signed)
Patient called to request clarification regarding her procedure with [place] on 04/29/2021.  She said during her appointment, they discussed two possible options:  Removal of fat from patient's belly to fill indentation in breast. Use artificial fat to fill indentation in breast.  Patient would like to know which one we will be doing for her procedure.  She said she has an appointment today, so it's ok to leave a detailed voicemail for her if she doesn't pick up when we call.

## 2021-04-26 NOTE — Telephone Encounter (Signed)
Last OV with Dawn 

## 2021-04-26 NOTE — Telephone Encounter (Signed)
Returned patients call. After discussing with Dr. Marla Roe, the left breast reconstruction of the areola office procedure will be performed by liposuction from the patients abdomen.

## 2021-04-29 ENCOUNTER — Encounter: Payer: Self-pay | Admitting: Plastic Surgery

## 2021-04-29 ENCOUNTER — Ambulatory Visit: Payer: BC Managed Care – PPO | Admitting: Plastic Surgery

## 2021-04-29 ENCOUNTER — Ambulatory Visit (INDEPENDENT_AMBULATORY_CARE_PROVIDER_SITE_OTHER): Payer: BC Managed Care – PPO | Admitting: Plastic Surgery

## 2021-04-29 ENCOUNTER — Other Ambulatory Visit: Payer: Self-pay

## 2021-04-29 VITALS — BP 120/74 | HR 84 | Ht 65.0 in | Wt 253.0 lb

## 2021-04-29 DIAGNOSIS — Z9889 Other specified postprocedural states: Secondary | ICD-10-CM | POA: Diagnosis not present

## 2021-04-29 DIAGNOSIS — N650 Deformity of reconstructed breast: Secondary | ICD-10-CM | POA: Diagnosis not present

## 2021-04-29 NOTE — Progress Notes (Signed)
Procedure Note  Preoperative Dx: asymmetry of areola left  Postoperative Dx: Same  Procedure: fat filling of left areola  Anesthesia: Lidocaine 1% with 1:100,000 epinephrine   Description of Procedure: Risks and complications were explained to the patient.  Consent was confirmed and the patient understands the risks and benefits.  The potential complications and alternatives were explained and the patient consents.  The patient expressed understanding the option of not having the procedure and the risks of a scar.  Time out was called and all information was confirmed to be correct.    The left breast and abdomen were prepped and drapped.  Lidocaine 1% with epinepherine was injected in the subcutaneous area.  After waiting several minutes for the local to take affect a #15 blade was used to make a small incision in a previous scar.  Fat was harvested with a 10 cc syringe.  The fat was allowed to settle and then injected under the areola of the left breast. The skin edges were reapproximated with 6-0 Monocryl subcuticular running closure.  A dressing was applied.  The patient was given instructions on how to care for the area and a follow up appointment.  Vanessa Dean tolerated the procedure well and there were no complications.

## 2021-05-03 ENCOUNTER — Ambulatory Visit (INDEPENDENT_AMBULATORY_CARE_PROVIDER_SITE_OTHER): Payer: BC Managed Care – PPO | Admitting: Adult Health

## 2021-05-13 ENCOUNTER — Ambulatory Visit (INDEPENDENT_AMBULATORY_CARE_PROVIDER_SITE_OTHER): Payer: BC Managed Care – PPO | Admitting: Surgical

## 2021-05-13 DIAGNOSIS — N62 Hypertrophy of breast: Secondary | ICD-10-CM

## 2021-05-13 DIAGNOSIS — G8929 Other chronic pain: Secondary | ICD-10-CM

## 2021-05-13 DIAGNOSIS — M546 Pain in thoracic spine: Secondary | ICD-10-CM

## 2021-05-13 DIAGNOSIS — Z9889 Other specified postprocedural states: Secondary | ICD-10-CM

## 2021-05-13 NOTE — Progress Notes (Signed)
56 year old female here for follow-up after fat filling to her left areola for asymmetry of her areola.  She underwent this procedure on 04/29/2021 with Dr. Marla Roe.  She is 2 weeks postop.  She reports that she feels as if there is some improvement in the area and is overall pleased that she noticed some improvement.  She does feel as if it is still not symmetric with the other, she also feels that the left breast is fuller. ? ?Chaperone present on exam ?On exam bilateral NAC's are viable, bilateral breast incisions are intact.  Left nipple areola has some volume loss laterally causing the areola to have a slight indentation. ? ?She would like to give it some time and reevaluate.  I think this is a great idea.  We will plan to have her follow-up in 2 to 3 months for reevaluation. ?

## 2021-05-23 ENCOUNTER — Other Ambulatory Visit: Payer: Self-pay

## 2021-05-23 ENCOUNTER — Ambulatory Visit (INDEPENDENT_AMBULATORY_CARE_PROVIDER_SITE_OTHER): Payer: BC Managed Care – PPO | Admitting: Adult Health

## 2021-05-23 ENCOUNTER — Encounter (INDEPENDENT_AMBULATORY_CARE_PROVIDER_SITE_OTHER): Payer: Self-pay | Admitting: Adult Health

## 2021-05-23 VITALS — BP 126/78 | HR 77 | Temp 97.9°F | Ht 65.0 in | Wt 255.0 lb

## 2021-05-23 DIAGNOSIS — Z6841 Body Mass Index (BMI) 40.0 and over, adult: Secondary | ICD-10-CM | POA: Diagnosis not present

## 2021-05-23 DIAGNOSIS — Z7985 Long-term (current) use of injectable non-insulin antidiabetic drugs: Secondary | ICD-10-CM | POA: Diagnosis not present

## 2021-05-23 DIAGNOSIS — E1169 Type 2 diabetes mellitus with other specified complication: Secondary | ICD-10-CM

## 2021-05-23 DIAGNOSIS — E668 Other obesity: Secondary | ICD-10-CM

## 2021-05-23 MED ORDER — OZEMPIC (2 MG/DOSE) 8 MG/3ML ~~LOC~~ SOPN: 2.0000 mg | PEN_INJECTOR | SUBCUTANEOUS | 0 refills | Status: DC

## 2021-05-23 NOTE — Progress Notes (Signed)
? ? ? ?Chief Complaint:  ? ?OBESITY ?Vanessa Dean is here to discuss her progress with her obesity treatment plan along with follow-up of her obesity related diagnoses. Vanessa Dean is on the Category 1 Plan +100 calories and states she is following her eating plan approximately 30% of the time. Vanessa Dean states she is walking more.  ? ?Today's visit was #: 24 ?Starting weight: 274 lbs ?Starting date: 09/09/2019 ?Today's weight: 255 lbs ?Today's date: 05/23/2021 ?Total lbs lost to date: 19 lbs ?Total lbs lost since last in-office visit: 0 ? ?Interim History:  ?[name]'s Ozempic 1 mg was increased to 2 mg on 12/28/2020. ?She has incorporated more exercise/activity in her daily routine-ie: walk halls of her office when getting up to use the restroom, standing more during her work day. ? ?Subjective:  ? ?1. Type 2 diabetes mellitus with other specified complication, without long-term current use of insulin ([place]) ?Vanessa Dean's Ozempic 1 mg was increased to 2 mg on 12/28/2020. ?She denies mass in neck, dysphagia, dyspepsia, persistent hoarseness, abd pain, or N/V/Constipation. ?She reports stable appetite. ? ?Assessment/Plan:  ? ?1. Type 2 diabetes mellitus with other specified complication, without long-term current use of insulin ([place]) ?Refill Ozempic 2 mg once weekly. ? ?- Refill Semaglutide, 2 MG/DOSE, (OZEMPIC, 2 MG/DOSE,) 8 MG/3ML SOPN; Inject 2 mg into the skin once a week.  Dispense: 3 mL; Refill: 0 ? ?2. Class 3 severe obesity with serious comorbidity and body mass index (BMI) of 40.0 to 44.9 in adult, unspecified obesity type ([place]) ? ?[name] is currently in the action stage of change. As such, her goal is to continue with weight loss efforts. She has agreed to the Category 1 Plan +100 calories.  ? ?1) Inquire how to obtain stand up desk at work. ? ?2) Continue to increase activity. ? ?Exercise goals:  As is. ? ?Behavioral modification strategies: increasing lean protein intake, decreasing simple carbohydrates, meal planning and cooking  strategies, keeping healthy foods in the home, and planning for success. ? ?Vanessa Dean has agreed to follow-up with our clinic in 4 weeks. She was informed of the importance of frequent follow-up visits to maximize her success with intensive lifestyle modifications for her multiple health conditions.  ? ?Objective:  ? ?Blood pressure 126/78, pulse 77, temperature 97.9 ?F (36.6 ?C), height 5' 5"  (1.651 m), weight 255 lb (115.7 kg), SpO2 96 %. ?Body mass index is 42.43 kg/m?. ? ?General: Cooperative, alert, well developed, in no acute distress. ?HEENT: Conjunctivae and lids unremarkable. ?Cardiovascular: Regular rhythm.  ?Lungs: Normal work of breathing. ?Neurologic: No focal deficits.  ? ?Lab Results  ?Component Value Date  ? CREATININE 1.1 03/04/2021  ? BUN 15 03/04/2021  ? NA 142 03/04/2021  ? K 3.8 03/04/2021  ? CL 102 03/04/2021  ? CO2 31 (A) 03/04/2021  ? ?Lab Results  ?Component Value Date  ? ALT 18 03/04/2021  ? AST 22 03/04/2021  ? ALKPHOS 110 03/04/2021  ? BILITOT 0.5 10/28/2020  ? ?Lab Results  ?Component Value Date  ? HGBA1C 5.7 03/04/2021  ? HGBA1C 5.8 (H) 10/28/2020  ? HGBA1C 5.8 (H) 03/17/2020  ? HGBA1C 6.5 (H) 09/09/2019  ? ?Lab Results  ?Component Value Date  ? INSULIN 12.3 03/17/2020  ? INSULIN 15.4 09/09/2019  ? ?Lab Results  ?Component Value Date  ? TSH 0.73 03/04/2021  ? ?Lab Results  ?Component Value Date  ? CHOL 151 03/04/2021  ? HDL 58 03/04/2021  ? [place] 72 03/04/2021  ? TRIG 119 03/04/2021  ? CHOLHDL 2.6 03/17/2020  ? ?  Lab Results  ?Component Value Date  ? VD25OH 46.6 03/04/2021  ? VD25OH 47.9 10/28/2020  ? VD25OH 52.2 03/17/2020  ? ?Lab Results  ?Component Value Date  ? WBC 7.4 03/04/2021  ? HGB 13.3 03/04/2021  ? HCT 39 03/04/2021  ? MCV 91 09/09/2019  ? PLT 275 03/04/2021  ? ?Attestation Statements:  ? ?Reviewed by clinician on day of visit: allergies, medications, problem list, medical history, surgical history, family history, social history, and previous encounter notes. ? ?I, Water quality scientist,  CMA, am acting as Location manager for Mina Marble, NP. ? ?I have reviewed the above documentation for accuracy and completeness, and I agree with the above. - Dail Meece d. Makela Niehoff, NP-C ?

## 2021-06-04 ENCOUNTER — Other Ambulatory Visit (INDEPENDENT_AMBULATORY_CARE_PROVIDER_SITE_OTHER): Payer: Self-pay | Admitting: Family Medicine

## 2021-06-04 DIAGNOSIS — E1159 Type 2 diabetes mellitus with other circulatory complications: Secondary | ICD-10-CM

## 2021-06-18 ENCOUNTER — Other Ambulatory Visit (INDEPENDENT_AMBULATORY_CARE_PROVIDER_SITE_OTHER): Payer: Self-pay | Admitting: Adult Health

## 2021-06-18 DIAGNOSIS — E1169 Type 2 diabetes mellitus with other specified complication: Secondary | ICD-10-CM

## 2021-06-20 NOTE — Telephone Encounter (Signed)
Mychart message sent.

## 2021-06-21 NOTE — Telephone Encounter (Signed)
LAST APPOINTMENT DATE: 05/23/21 ?NEXT APPOINTMENT DATE: 07/19/21 ? ? ?[id number] [place] [facility] [address]?[place]?Phone: [phone]: [phone]? ?[place] [address]?[address]?Phone: [phone]: [phone]? ?Patient is requesting a refill of the following medications: ?Requested Prescriptions  ? ?Pending Prescriptions Disp Refills  ? OZEMPIC, 2 MG/DOSE, 8 MG/3ML SOPN [Pharmacy Med Name: OZEMPIC 8 MG/3 ML (2 MG/DOSE)]    ?  Sig: INJECT 2 MG INTO THE SKIN ONCE A WEEK.  ? ? ?Date last filled: 05/23/21 ?Previously prescribed by KMina Marble? ?Lab Results  ?Component Value Date  ? HGBA1C 5.7 03/04/2021  ? HGBA1C 5.8 (H) 10/28/2020  ? HGBA1C 5.8 (H) 03/17/2020  ? ?Lab Results  ?Component Value Date  ? MICROALBUR 1.88 03/04/2021  ? [place]72 03/04/2021  ? CREATININE 1.1 03/04/2021  ? ?Lab Results  ?Component Value Date  ? VD25OH 46.6 03/04/2021  ? VD25OH 47.9 10/28/2020  ? VD25OH 52.2 03/17/2020  ? ? ?BP Readings from Last 3 Encounters:  ?05/23/21 126/78  ?04/29/21 120/74  ?04/05/21 114/80  ? ? ?

## 2021-06-28 ENCOUNTER — Ambulatory Visit (INDEPENDENT_AMBULATORY_CARE_PROVIDER_SITE_OTHER): Payer: BC Managed Care – PPO | Admitting: Adult Health

## 2021-07-04 ENCOUNTER — Other Ambulatory Visit (INDEPENDENT_AMBULATORY_CARE_PROVIDER_SITE_OTHER): Payer: Self-pay | Admitting: Family Medicine

## 2021-07-04 DIAGNOSIS — E1159 Type 2 diabetes mellitus with other circulatory complications: Secondary | ICD-10-CM

## 2021-07-19 ENCOUNTER — Ambulatory Visit (INDEPENDENT_AMBULATORY_CARE_PROVIDER_SITE_OTHER): Payer: BC Managed Care – PPO | Admitting: Adult Health

## 2021-07-19 ENCOUNTER — Encounter (INDEPENDENT_AMBULATORY_CARE_PROVIDER_SITE_OTHER): Payer: Self-pay | Admitting: Adult Health

## 2021-07-19 VITALS — BP 122/73 | HR 64 | Temp 98.0°F | Ht 65.0 in | Wt 253.0 lb

## 2021-07-19 DIAGNOSIS — Z6841 Body Mass Index (BMI) 40.0 and over, adult: Secondary | ICD-10-CM

## 2021-07-19 DIAGNOSIS — E669 Obesity, unspecified: Secondary | ICD-10-CM

## 2021-07-19 DIAGNOSIS — Z7985 Long-term (current) use of injectable non-insulin antidiabetic drugs: Secondary | ICD-10-CM

## 2021-07-19 DIAGNOSIS — E559 Vitamin D deficiency, unspecified: Secondary | ICD-10-CM

## 2021-07-19 DIAGNOSIS — E66813 Obesity, class 3: Secondary | ICD-10-CM

## 2021-07-19 DIAGNOSIS — E1159 Type 2 diabetes mellitus with other circulatory complications: Secondary | ICD-10-CM | POA: Diagnosis not present

## 2021-07-19 DIAGNOSIS — E1169 Type 2 diabetes mellitus with other specified complication: Secondary | ICD-10-CM

## 2021-07-19 DIAGNOSIS — I152 Hypertension secondary to endocrine disorders: Secondary | ICD-10-CM

## 2021-07-19 DIAGNOSIS — K5900 Constipation, unspecified: Secondary | ICD-10-CM

## 2021-07-19 DIAGNOSIS — Z9189 Other specified personal risk factors, not elsewhere classified: Secondary | ICD-10-CM

## 2021-07-19 MED ORDER — OZEMPIC (2 MG/DOSE) 8 MG/3ML ~~LOC~~ SOPN: 2.0000 mg | PEN_INJECTOR | SUBCUTANEOUS | 0 refills | Status: DC

## 2021-07-20 LAB — HEMOGLOBIN A1C
Est. average glucose Bld gHb Est-mCnc: 114 mg/dL
Hgb A1c MFr Bld: 5.6 % (ref 4.8–5.6)

## 2021-07-20 LAB — COMPREHENSIVE METABOLIC PANEL
ALT: 18 IU/L (ref 0–32)
AST: 20 IU/L (ref 0–40)
Albumin/Globulin Ratio: 1.4 (ref 1.2–2.2)
Albumin: 4.2 g/dL (ref 3.8–4.9)
Alkaline Phosphatase: 129 IU/L — ABNORMAL HIGH (ref 44–121)
BUN/Creatinine Ratio: 12 (ref 9–23)
BUN: 13 mg/dL (ref 6–24)
Bilirubin Total: 0.5 mg/dL (ref 0.0–1.2)
CO2: 25 mmol/L (ref 20–29)
Calcium: 9.4 mg/dL (ref 8.7–10.2)
Chloride: 104 mmol/L (ref 96–106)
Creatinine, Ser: 1.05 mg/dL — ABNORMAL HIGH (ref 0.57–1.00)
Globulin, Total: 2.9 g/dL (ref 1.5–4.5)
Glucose: 90 mg/dL (ref 70–99)
Potassium: 3.9 mmol/L (ref 3.5–5.2)
Sodium: 142 mmol/L (ref 134–144)
Total Protein: 7.1 g/dL (ref 6.0–8.5)
eGFR: 63 mL/min/{1.73_m2} (ref >59)

## 2021-07-20 LAB — VITAMIN D 25 HYDROXY (VIT D DEFICIENCY, FRACTURES): Vit D, 25-Hydroxy: 42.9 ng/mL (ref 30.0–100.0)

## 2021-07-20 LAB — INSULIN, RANDOM: INSULIN: 16.4 u[IU]/mL (ref 2.6–24.9)

## 2021-07-21 ENCOUNTER — Encounter (INDEPENDENT_AMBULATORY_CARE_PROVIDER_SITE_OTHER): Payer: Self-pay | Admitting: Adult Health

## 2021-07-21 NOTE — Telephone Encounter (Signed)
Please advise 

## 2021-07-28 DIAGNOSIS — K5900 Constipation, unspecified: Secondary | ICD-10-CM | POA: Insufficient documentation

## 2021-08-02 NOTE — Progress Notes (Signed)
Chief Complaint:   OBESITY Vanessa Dean is here to discuss her progress with her obesity treatment plan along with follow-up of her obesity related diagnoses. Vanessa Dean is on the Category 1 Plan+ 100 and states she is following her eating plan approximately 50-60% of the time. Vanessa Dean states she is exercising 0 minutes 0 times per week.  Today's visit was #: 25 Starting weight: 274 lbs Starting date: 09/09/2019 Today's weight: 253 lbs Today's date: 07/19/2021 Total lbs lost to date: 21 lbs Total lbs lost since last in-office visit: 2   Interim History:  Vanessa Dean has been on max dose of Ozempic (41m once per week) since 12/28/20. She had her annual physical with Dr. WDema Severin(Sadie Haber on Mar 28, 2021.  Subjective:   1. Type 2 diabetes mellitus with other specified complication, without long-term current use of insulin (HCC) Vanessa Dean's fasting blood sugar is 90-100. Vanessa Dean has been on max dose of Ozempic (242monce per week) since 12/28/20. She denies mass in neck, dysphagia, dyspepsia, persistent hoarseness, abd pain, or N/V/Constipation.  2. Constipation, unspecified constipation type Mica will have daily BM, with sensation of not completely emptying. She denies hematochezia.  3. Hypertension associated with type 2 diabetes mellitus ([place] pressure and heart rate at goal during office visit today.  4. Vitamin D deficiency Vanessa Dean is currently taking over the counter Vit D3, she is unsure of dosage.   5. At risk for nausea Vanessa Dean is at risk for nausea due to Ozempic.  Assessment/Plan:   1. Type 2 diabetes mellitus with other specified complication, without long-term current use of insulin (HCC) We will refill Ozempic 2 mg once a wek for 1 month with no refills.  Amery will have labs checked today.  -Refill Semaglutide, 2 MG/DOSE, (OZEMPIC, 2 MG/DOSE,) 8 MG/3ML SOPN; Inject 2 mg into the skin once a week.  Dispense: 3 mL; Refill: 0  - Hemoglobin A1c - Insulin, random  2. Constipation,  unspecified constipation type Ariadna will increase water intake and use over the counter Mirlax.  3. Hypertension associated with type 2 diabetes mellitus (HCTregoLaVie will have labs checked today.  - Comprehensive metabolic panel  4. Vitamin D deficiency Aleja will have labs checked today.  - VITAMIN D 25 Hydroxy (Vit-D Deficiency, Fractures)  5. At risk for nausea Vanessa Dean was given approximately 15 minutes of nausea prevention counseling today. Vanessa Dean is at risk for nausea due to her new or current medication. She was encouraged to titrate her medication slowly, make sure to stay hydrated, eat smaller portions throughout the day, and avoid high fat meals.   6. Obesity: BMI 42.1 Vanessa Dean is currently in the action stage of change. As such, her goal is to continue with weight loss efforts. She has agreed to the Category 1 Plan+ 100.  Exercise goals:  Increase daily walking.  Behavioral modification strategies: increasing lean protein intake, decreasing simple carbohydrates, meal planning and cooking strategies, keeping healthy foods in the home, and planning for success.  Vanessa Dean has agreed to follow-up with our clinic in 4 weeks. She was informed of the importance of frequent follow-up visits to maximize her success with intensive lifestyle modifications for her multiple health conditions.   Vanessa Dean was informed we would discuss her lab results at her next visit unless there is a critical issue that needs to be addressed sooner. Vanessa Dean agreed to keep her next visit at the agreed upon time to discuss these results.  Objective:   Blood pressure 122/73, pulse  64, temperature 98 F (36.7 C), height 5' 5"  (1.651 m), weight 253 lb (114.8 kg), SpO2 98 %. Body mass index is 42.1 kg/m.  General: Cooperative, alert, well developed, in no acute distress. HEENT: Conjunctivae and lids unremarkable. Cardiovascular: Regular rhythm.  Lungs: Normal work of breathing. Neurologic: No focal deficits.    Lab Results  Component Value Date   CREATININE 1.05 (H) 07/19/2021   BUN 13 07/19/2021   NA 142 07/19/2021   K 3.9 07/19/2021   CL 104 07/19/2021   CO2 25 07/19/2021   Lab Results  Component Value Date   ALT 18 07/19/2021   AST 20 07/19/2021   ALKPHOS 129 (H) 07/19/2021   BILITOT 0.5 07/19/2021   Lab Results  Component Value Date   HGBA1C 5.6 07/19/2021   HGBA1C 5.7 03/04/2021   HGBA1C 5.8 (H) 10/28/2020   HGBA1C 5.8 (H) 03/17/2020   HGBA1C 6.5 (H) 09/09/2019   Lab Results  Component Value Date   INSULIN 16.4 07/19/2021   INSULIN 12.3 03/17/2020   INSULIN 15.4 09/09/2019   Lab Results  Component Value Date   TSH 0.73 03/04/2021   Lab Results  Component Value Date   CHOL 151 03/04/2021   HDL 58 03/04/2021   LDLCALC 72 03/04/2021   TRIG 119 03/04/2021   CHOLHDL 2.6 03/17/2020   Lab Results  Component Value Date   VD25OH 42.9 07/19/2021   VD25OH 46.6 03/04/2021   VD25OH 47.9 10/28/2020   Lab Results  Component Value Date   WBC 7.4 03/04/2021   HGB 13.3 03/04/2021   HCT 39 03/04/2021   MCV 91 09/09/2019   PLT 275 03/04/2021   No results found for: IRON, TIBC, FERRITIN  Attestation Statements:   Reviewed by clinician on day of visit: allergies, medications, problem list, medical history, surgical history, family history, social history, and previous encounter notes.  I, Brendell Tyus, RMA, am acting as transcriptionist for Mina Marble, NP.  I have reviewed the above documentation for accuracy and completeness, and I agree with the above. -  Josilyn Shippee d. Jameal Razzano, NP-C

## 2021-08-15 ENCOUNTER — Other Ambulatory Visit (INDEPENDENT_AMBULATORY_CARE_PROVIDER_SITE_OTHER): Payer: Self-pay | Admitting: Adult Health

## 2021-08-15 DIAGNOSIS — E1169 Type 2 diabetes mellitus with other specified complication: Secondary | ICD-10-CM

## 2021-08-18 ENCOUNTER — Encounter (INDEPENDENT_AMBULATORY_CARE_PROVIDER_SITE_OTHER): Payer: Self-pay | Admitting: Adult Health

## 2021-08-18 ENCOUNTER — Ambulatory Visit (INDEPENDENT_AMBULATORY_CARE_PROVIDER_SITE_OTHER): Payer: BC Managed Care – PPO | Admitting: Adult Health

## 2021-08-18 VITALS — BP 115/79 | HR 68 | Temp 97.7°F | Ht 65.0 in | Wt 252.0 lb

## 2021-08-18 DIAGNOSIS — E1169 Type 2 diabetes mellitus with other specified complication: Secondary | ICD-10-CM

## 2021-08-18 DIAGNOSIS — R7989 Other specified abnormal findings of blood chemistry: Secondary | ICD-10-CM

## 2021-08-18 DIAGNOSIS — E559 Vitamin D deficiency, unspecified: Secondary | ICD-10-CM | POA: Diagnosis not present

## 2021-08-18 DIAGNOSIS — E669 Obesity, unspecified: Secondary | ICD-10-CM | POA: Diagnosis not present

## 2021-08-18 DIAGNOSIS — Z6841 Body Mass Index (BMI) 40.0 and over, adult: Secondary | ICD-10-CM

## 2021-08-18 DIAGNOSIS — Z7985 Long-term (current) use of injectable non-insulin antidiabetic drugs: Secondary | ICD-10-CM

## 2021-08-18 DIAGNOSIS — Z9189 Other specified personal risk factors, not elsewhere classified: Secondary | ICD-10-CM

## 2021-08-18 DIAGNOSIS — E66813 Obesity, class 3: Secondary | ICD-10-CM

## 2021-08-18 MED ORDER — OZEMPIC (2 MG/DOSE) 8 MG/3ML ~~LOC~~ SOPN: 2.0000 mg | PEN_INJECTOR | SUBCUTANEOUS | 0 refills | Status: DC

## 2021-08-19 ENCOUNTER — Ambulatory Visit (INDEPENDENT_AMBULATORY_CARE_PROVIDER_SITE_OTHER): Payer: BC Managed Care – PPO | Admitting: Plastic Surgery

## 2021-08-19 ENCOUNTER — Encounter: Payer: Self-pay | Admitting: Plastic Surgery

## 2021-08-19 DIAGNOSIS — N62 Hypertrophy of breast: Secondary | ICD-10-CM

## 2021-08-19 DIAGNOSIS — N6459 Other signs and symptoms in breast: Secondary | ICD-10-CM

## 2021-08-19 NOTE — Progress Notes (Signed)
   Subjective:    Patient ID: Vanessa Dean, female    DOB: Jul 23, 1965, 56 y.o.   MRN: [id number]  The patient is a 56 year old female here for evaluation of her breast.  She underwent bilateral breast reductions. Her reductions were in December 2020 and she had over 1200 g removed from each breast.  She had a little bit of nipple retraction in the left breast and we tried to do a scar release and fat grafting.  It helped a little bit but she still has the dimpling in at the 3 o'clock position.  Right now its mostly.  It does not hurt.  She feels that she can probably just leave it alone for right now and see if it improves a little bit with time.      Review of Systems  Constitutional: Negative.   HENT: Negative.    Eyes: Negative.   Respiratory: Negative.    Cardiovascular: Negative.   Gastrointestinal: Negative.   Endocrine: Negative.   Genitourinary: Negative.   Musculoskeletal: Negative.   Skin: Negative.        Objective:   Physical Exam Constitutional:      Appearance: Normal appearance.  Cardiovascular:     Rate and Rhythm: Normal rate.     Pulses: Normal pulses.  Skin:    Capillary Refill: Capillary refill takes less than 2 seconds.     Coloration: Skin is not jaundiced.     Findings: No bruising or lesion.  Neurological:     Mental Status: She is alert and oriented to person, place, and time.  Psychiatric:        Mood and Affect: Mood normal.        Behavior: Behavior normal.        Thought Content: Thought content normal.        Judgment: Judgment normal.       Assessment & Plan:     ICD-10-CM   1. Symptomatic mammary hypertrophy  N62        I agree with massage and giving it a little bit more time.  I would like to see her back in a year and if she would like I am certainly willing to try contracture release again.

## 2021-08-22 DIAGNOSIS — R7989 Other specified abnormal findings of blood chemistry: Secondary | ICD-10-CM | POA: Insufficient documentation

## 2021-08-22 NOTE — Progress Notes (Signed)
Chief Complaint:   OBESITY Vanessa Dean is here to discuss her progress with her obesity treatment plan along with follow-up of her obesity related diagnoses. Vanessa Dean is on the Category 1 Plan +100 and states she is following her eating plan approximately 60% of the time. Vanessa Dean states she is walking around campus.  Today's visit was #: 77 Starting weight: 274 lbs Starting date: 09/09/2019 Today's weight: 252 lbs Today's date: 08/18/2021 Total lbs lost to date: 22 lbs Total lbs lost since last in-office visit: 1  Interim History:  Vanessa Dean will start her new position [employer], begins 03 September 2021. 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   Subjective:   1. Type 2 diabetes mellitus with other specified complication, without long-term current use of insulin ([place]) Vanessa Dean' A1c on 09/09/19 was 6.5, A1c on 07/19/21 was 5.6, insulin was 16.4.  She is on Ozempic 2 mg, tolerating.  Labs discussed during visit today.  2. Vitamin D deficiency 07/19/21 Vit D Level- 42.9 Vanessa Dean is currently on over the counter Multivitamin and over the counter Vit D3, 5,000 IU's daily.  Labs discussed during visit today.  3. Elevated serum creatinine Labs discussed during visit today.  07/19/21, CMP Creatine: 1.05, GFR: 63  4. At risk for nausea Vanessa Dean is at risk for nausea due to Ozempic .  Assessment/Plan:   1. Type 2 diabetes mellitus with other specified complication, without long-term current use of insulin (HCC) We will refill Ozempic 2 mg SubQ once weekly for 1 month with 0 refills.  -Refill Semaglutide, 2 MG/DOSE, (OZEMPIC, 2 MG/DOSE,) 8 MG/3ML SOPN; Inject 2 mg into the skin once a week.  Dispense: 3 mL; Refill: 0  2. Vitamin D deficiency Tonyia will continue Vit D supplement.   3. Elevated serum creatinine Ellarie will avoid NSAIDS.  4. At risk for  nausea Vanessa Dean was given approximately 15 minutes of nausea prevention counseling today. Vanessa Dean is at risk for nausea due to her new or current medication. She was encouraged to titrate her medication slowly, make sure to stay hydrated, eat smaller portions throughout the day, and avoid high fat meals.    5. Obesity: BMI 42.0 Vanessa Dean is currently in the action stage of change. As such, her goal is to continue with weight loss efforts. She has agreed to the Category 1 Plan + 100.  Exercise goals: As is.  Behavioral modification strategies: increasing lean protein intake, decreasing simple carbohydrates, meal planning and cooking strategies, keeping healthy foods in the home, travel eating strategies, and planning for success.  Vanessa Dean has agreed to follow-up with our clinic in 4 weeks. She was informed of the importance of frequent follow-up visits to maximize her success with intensive lifestyle modifications for her multiple health conditions.   Objective:   Blood pressure 115/79, pulse 68, temperature 97.7 F (36.5 C), height 5' 5"  (1.651 m), weight 252 lb (114.3 kg), SpO2 99 %. Body mass index is 41.93 kg/m.  General: Cooperative, alert, well developed, in no acute distress. HEENT: Conjunctivae and lids unremarkable. Cardiovascular: Regular rhythm.  Lungs: Normal work of breathing. Neurologic: No focal deficits.   Lab Results  Component Value Date   CREATININE 1.05 (H) 07/19/2021   BUN 13 07/19/2021   NA 142 07/19/2021   K 3.9 07/19/2021   CL 104 07/19/2021   CO2  25 07/19/2021   Lab Results  Component Value Date   ALT 18 07/19/2021   AST 20 07/19/2021   ALKPHOS 129 (H) 07/19/2021   BILITOT 0.5 07/19/2021   Lab Results  Component Value Date   HGBA1C 5.6 07/19/2021   HGBA1C 5.7 03/04/2021   HGBA1C 5.8 (H) 10/28/2020   HGBA1C 5.8 (H) 03/17/2020   HGBA1C 6.5 (H) 09/09/2019   Lab Results  Component Value Date   INSULIN 16.4 07/19/2021   INSULIN 12.3 03/17/2020    INSULIN 15.4 09/09/2019   Lab Results  Component Value Date   TSH 0.73 03/04/2021   Lab Results  Component Value Date   CHOL 151 03/04/2021   HDL 58 03/04/2021   LDLCALC 72 03/04/2021   TRIG 119 03/04/2021   CHOLHDL 2.6 03/17/2020   Lab Results  Component Value Date   VD25OH 42.9 07/19/2021   VD25OH 46.6 03/04/2021   VD25OH 47.9 10/28/2020   Lab Results  Component Value Date   WBC 7.4 03/04/2021   HGB 13.3 03/04/2021   HCT 39 03/04/2021   MCV 91 09/09/2019   PLT 275 03/04/2021   No results found for: "IRON", "TIBC", "FERRITIN"  Attestation Statements:   Reviewed by clinician on day of visit: allergies, medications, problem list, medical history, surgical history, family history, social history, and previous encounter notes.  I, Brendell Tyus, RMA, am acting as transcriptionist for Mina Marble, NP.  I have reviewed the above documentation for accuracy and completeness, and I agree with the above. -  Melville Engen d. Maeli Spacek, NP-C

## 2021-09-15 ENCOUNTER — Other Ambulatory Visit (INDEPENDENT_AMBULATORY_CARE_PROVIDER_SITE_OTHER): Payer: Self-pay | Admitting: Adult Health

## 2021-09-15 DIAGNOSIS — E1169 Type 2 diabetes mellitus with other specified complication: Secondary | ICD-10-CM

## 2021-09-22 ENCOUNTER — Encounter (INDEPENDENT_AMBULATORY_CARE_PROVIDER_SITE_OTHER): Payer: Self-pay | Admitting: Adult Health

## 2021-09-22 ENCOUNTER — Ambulatory Visit (INDEPENDENT_AMBULATORY_CARE_PROVIDER_SITE_OTHER): Payer: BC Managed Care – PPO | Admitting: Adult Health

## 2021-09-22 VITALS — BP 109/74 | HR 76 | Temp 98.9°F | Ht 65.0 in | Wt 254.0 lb

## 2021-09-22 DIAGNOSIS — E669 Obesity, unspecified: Secondary | ICD-10-CM

## 2021-09-22 DIAGNOSIS — E785 Hyperlipidemia, unspecified: Secondary | ICD-10-CM

## 2021-09-22 DIAGNOSIS — E1159 Type 2 diabetes mellitus with other circulatory complications: Secondary | ICD-10-CM | POA: Diagnosis not present

## 2021-09-22 DIAGNOSIS — I152 Hypertension secondary to endocrine disorders: Secondary | ICD-10-CM

## 2021-09-22 DIAGNOSIS — E1169 Type 2 diabetes mellitus with other specified complication: Secondary | ICD-10-CM

## 2021-09-22 DIAGNOSIS — Z6841 Body Mass Index (BMI) 40.0 and over, adult: Secondary | ICD-10-CM

## 2021-09-22 DIAGNOSIS — Z7985 Long-term (current) use of injectable non-insulin antidiabetic drugs: Secondary | ICD-10-CM

## 2021-09-22 DIAGNOSIS — E66813 Obesity, class 3: Secondary | ICD-10-CM

## 2021-09-22 DIAGNOSIS — Z9189 Other specified personal risk factors, not elsewhere classified: Secondary | ICD-10-CM

## 2021-09-22 MED ORDER — OZEMPIC (2 MG/DOSE) 8 MG/3ML ~~LOC~~ SOPN: 2.0000 mg | PEN_INJECTOR | SUBCUTANEOUS | 0 refills | Status: DC

## 2021-09-22 MED ORDER — VALSARTAN 40 MG PO TABS: 40.0000 mg | ORAL_TABLET | Freq: Every day | ORAL | 0 refills | Status: DC

## 2021-09-22 MED ORDER — HYDROCHLOROTHIAZIDE 12.5 MG PO TABS: ORAL_TABLET | ORAL | 0 refills | Status: AC

## 2021-09-22 MED ORDER — ATORVASTATIN CALCIUM 10 MG PO TABS: 10.0000 mg | ORAL_TABLET | Freq: Every evening | ORAL | 0 refills | Status: AC

## 2021-09-26 NOTE — Progress Notes (Signed)
Chief Complaint:   OBESITY Vanessa Dean is here to discuss her progress with her obesity treatment plan along with follow-up of her obesity related diagnoses. Vanessa Dean is on the Category 1 Plan + 100 calories and states she is following her eating plan approximately 40% of the time. Vanessa Dean states she is walking around campus 2 times per week.    Today's visit was #: 51 Starting weight: 274 lbs Starting date: 09/09/2019 Today's weight: 254 lbs Today's date: 09/22/2021 Total lbs lost to date: 20 Total lbs lost since last in-office visit: 0  Interim History:  Vanessa Dean notes since her last office visit, she has been on vacation for 5 days at [facility].  She has been settling into her new position.   New schedule/demands has interfered with proper eating and hydration.  She endorses 4 days of vertigo symptoms (dizziness and nausea).  Subjective:   1. Hypertension associated with type 2 diabetes mellitus ([place]) Vanessa Dean's blood pressure and heart rate are at goal at her office visit.   She is on valsartan 40 mg once daily and hydrochlorothiazide 12.5 mg as needed for edema.   She estimates to use hydrochlorothiazide twice per week.  2. Hyperlipidemia associated with type 2 diabetes mellitus ([place]) On 03/04/2021, Vanessa Dean's lipid panel was at goal.   Well-controlled on atorvastatin 10 mg daily.   On 07/19/2021 CMP, LFTs within normal limits.  3. Type 2 diabetes mellitus with other specified complication, without long-term current use of insulin ([place]) On 07/19/2021, Vanessa Dean's A1c was 5.6-at goal, insulin remains above goal at 16.4.   She is currently on Ozempic 2 mg once weekly- denies medication SE.   Fasting blood glucose ranges in the 90s. She denise sx's of hypoglycemia with BG readings <100.  4. At risk for dehydration Vanessa Dean is at risk for dehydration due to decrease in water intake.  Assessment/Plan:   1. Hypertension associated with type 2 diabetes mellitus ([place]) Vanessa Dean will continue  her medications, and we will refill for 90 days.  - hydrochlorothiazide (HYDRODIURIL) 12.5 MG tablet; Take as needed for edema. Max 1 tab per day.  Dispense: 90 tablet; Refill: 0 - valsartan (DIOVAN) 40 MG tablet; Take 1 tablet (40 mg total) by mouth daily.  Dispense: 90 tablet; Refill: 0  2. Hyperlipidemia associated with type 2 diabetes mellitus ([place]) Vanessa Dean will continue Lipitor 10 mg nightly, and we will refill for 90 days.  - atorvastatin (LIPITOR) 10 MG tablet; Take 1 tablet (10 mg total) by mouth every evening.  Dispense: 90 tablet; Refill: 0  3. Type 2 diabetes mellitus with other specified complication, without long-term current use of insulin (HCC) Vanessa Dean will continue Ozempic 2 mg once weekly, and we will refill for 1 month.  - Semaglutide, 2 MG/DOSE, (OZEMPIC, 2 MG/DOSE,) 8 MG/3ML SOPN; Inject 2 mg into the skin once a week.  Dispense: 3 mL; Refill: 0  4. At risk for dehydration Vanessa Dean was given approximately 15 minutes of dehydration prevention counseling today. Vanessa Dean is at risk for dehydration due to weight loss and current medication(s). She was encouraged to hydrate and monitor fluid status to avoid dehydration as weight loss plateaus.  5. Obesity, current BMI 42.4 Vanessa Dean is currently in the action stage of change. As such, her goal is to continue with weight loss efforts. She has agreed to the Category 1 Plan + 100 calories.   Increase water intake to 80 ounces per day.  Exercise goals: As is.   Behavioral modification strategies: increasing  lean protein intake, decreasing simple carbohydrates, meal planning and cooking strategies, keeping healthy foods in the home, and planning for success.  Vanessa Dean has agreed to follow-up with our clinic in 4 weeks. She was informed of the importance of frequent follow-up visits to maximize her success with intensive lifestyle modifications for her multiple health conditions.   Objective:   Blood pressure 109/74, pulse 76, temperature 98.9  F (37.2 C), height 5' 5"  (1.651 m), weight 254 lb (115.2 kg), SpO2 98 %. Body mass index is 42.27 kg/m.  General: Cooperative, alert, well developed, in no acute distress. HEENT: Conjunctivae and lids unremarkable. Cardiovascular: Regular rhythm.  Lungs: Normal work of breathing. Neurologic: No focal deficits.   Lab Results  Component Value Date   CREATININE 1.05 (H) 07/19/2021   BUN 13 07/19/2021   NA 142 07/19/2021   K 3.9 07/19/2021   CL 104 07/19/2021   CO2 25 07/19/2021   Lab Results  Component Value Date   ALT 18 07/19/2021   AST 20 07/19/2021   ALKPHOS 129 (H) 07/19/2021   BILITOT 0.5 07/19/2021   Lab Results  Component Value Date   HGBA1C 5.6 07/19/2021   HGBA1C 5.7 03/04/2021   HGBA1C 5.8 (H) 10/28/2020   HGBA1C 5.8 (H) 03/17/2020   HGBA1C 6.5 (H) 09/09/2019   Lab Results  Component Value Date   INSULIN 16.4 07/19/2021   INSULIN 12.3 03/17/2020   INSULIN 15.4 09/09/2019   Lab Results  Component Value Date   TSH 0.73 03/04/2021   Lab Results  Component Value Date   CHOL 151 03/04/2021   HDL 58 03/04/2021   LDLCALC 72 03/04/2021   TRIG 119 03/04/2021   CHOLHDL 2.6 03/17/2020   Lab Results  Component Value Date   VD25OH 42.9 07/19/2021   VD25OH 46.6 03/04/2021   VD25OH 47.9 10/28/2020   Lab Results  Component Value Date   WBC 7.4 03/04/2021   HGB 13.3 03/04/2021   HCT 39 03/04/2021   MCV 91 09/09/2019   PLT 275 03/04/2021   No results found for: "IRON", "TIBC", "FERRITIN"  Attestation Statements:   Reviewed by clinician on day of visit: allergies, medications, problem list, medical history, surgical history, family history, social history, and previous encounter notes.   Wilhemena Durie, am acting as transcriptionist for Mina Marble, NP.  I have reviewed the above documentation for accuracy and completeness, and I agree with the above. -  Dawit Tankard d. Tarron Krolak, NP-C

## 2021-09-28 ENCOUNTER — Other Ambulatory Visit (INDEPENDENT_AMBULATORY_CARE_PROVIDER_SITE_OTHER): Payer: Self-pay | Admitting: Adult Health

## 2021-09-28 DIAGNOSIS — E1169 Type 2 diabetes mellitus with other specified complication: Secondary | ICD-10-CM

## 2021-09-29 ENCOUNTER — Encounter (INDEPENDENT_AMBULATORY_CARE_PROVIDER_SITE_OTHER): Payer: Self-pay | Admitting: Adult Health

## 2021-09-29 ENCOUNTER — Other Ambulatory Visit (INDEPENDENT_AMBULATORY_CARE_PROVIDER_SITE_OTHER): Payer: Self-pay | Admitting: Adult Health

## 2021-09-29 DIAGNOSIS — E1169 Type 2 diabetes mellitus with other specified complication: Secondary | ICD-10-CM

## 2021-09-29 MED ORDER — OZEMPIC (2 MG/DOSE) 8 MG/3ML ~~LOC~~ SOPN: 2.0000 mg | PEN_INJECTOR | SUBCUTANEOUS | 0 refills | Status: DC

## 2021-10-11 ENCOUNTER — Other Ambulatory Visit (HOSPITAL_COMMUNITY): Payer: Self-pay | Admitting: Interventional Radiology

## 2021-10-12 ENCOUNTER — Encounter (INDEPENDENT_AMBULATORY_CARE_PROVIDER_SITE_OTHER): Payer: Self-pay

## 2021-10-26 ENCOUNTER — Ambulatory Visit (INDEPENDENT_AMBULATORY_CARE_PROVIDER_SITE_OTHER): Payer: BC Managed Care – PPO | Admitting: Adult Health

## 2021-10-26 ENCOUNTER — Encounter (INDEPENDENT_AMBULATORY_CARE_PROVIDER_SITE_OTHER): Payer: Self-pay | Admitting: Adult Health

## 2021-10-26 VITALS — BP 129/83 | HR 70 | Temp 97.8°F | Ht 65.0 in | Wt 256.0 lb

## 2021-10-26 DIAGNOSIS — E669 Obesity, unspecified: Secondary | ICD-10-CM | POA: Diagnosis not present

## 2021-10-26 DIAGNOSIS — Z6841 Body Mass Index (BMI) 40.0 and over, adult: Secondary | ICD-10-CM | POA: Diagnosis not present

## 2021-10-26 DIAGNOSIS — E1169 Type 2 diabetes mellitus with other specified complication: Secondary | ICD-10-CM | POA: Diagnosis not present

## 2021-10-26 MED ORDER — OZEMPIC (2 MG/DOSE) 8 MG/3ML ~~LOC~~ SOPN: 2.0000 mg | PEN_INJECTOR | SUBCUTANEOUS | 0 refills | Status: DC

## 2021-10-29 NOTE — Progress Notes (Unsigned)
Chief Complaint:   OBESITY Vanessa Dean is here to discuss her progress with her obesity treatment plan along with follow-up of her obesity related diagnoses. Vanessa Dean is on the Category 1 Plan + 100 calories and states she is following her eating plan approximately 40% of the time. Vanessa Dean states she is walking 30 minutes 2 times per week.  Today's visit was #: 28 Starting weight: 274 lbs Starting date: 09/09/2019 Today's weight: 256 lbs Today's date: 10/26/2021 Total lbs lost to date: 18 lbs Total lbs lost since last in-office visit: +2 lbs  Interim History: Due to inability of finding prescription, she went 4 weeks without Ozempic 2 mg injection.  She restarted Ozempic 2 mg on 10/16/2021. She denies medication side effects.  She also has +2.4 lbs muscle-0.8 lb ***.   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   Subjective:   1. Type 2 diabetes mellitus with other specified complication, without long-term current use of insulin (HCC) *** 07/19/2021 A1c 5.6, Insulin level 16.4.  Assessment/Plan:   1. Type 2 diabetes mellitus with other specified complication, without long-term current use of insulin (HCC) Refill - Semaglutide, 2 MG/DOSE, (OZEMPIC, 2 MG/DOSE,) 8 MG/3ML SOPN; Inject 2 mg into the skin once a week.  Dispense: 9 mL; Refill: 0  2. Obesity, current BMI 42.7 Vanessa Dean is currently in the action stage of change. As such, her goal is to continue with weight loss efforts. She has agreed to the Category 1 Plan + 100 calories.   Exercise goals:  As is.   Behavioral modification strategies: increasing lean protein intake, decreasing simple carbohydrates, meal planning and cooking strategies, keeping healthy foods in the home, and planning for success.  Vanessa Dean has agreed to follow-up with our clinic in 4 weeks. She was informed of the importance of frequent follow-up visits to maximize her success with intensive lifestyle modifications  for her multiple health conditions.   Objective:   Blood pressure 129/83, pulse 70, temperature 97.8 F (36.6 C), height 5' 5"  (1.651 m), weight 256 lb (116.1 kg), SpO2 96 %. Body mass index is 42.6 kg/m.  General: Cooperative, alert, well developed, in no acute distress. HEENT: Conjunctivae and lids unremarkable. Cardiovascular: Regular rhythm.  Lungs: Normal work of breathing. Neurologic: No focal deficits.   Lab Results  Component Value Date   CREATININE 1.05 (H) 07/19/2021   BUN 13 07/19/2021   NA 142 07/19/2021   K 3.9 07/19/2021   CL 104 07/19/2021   CO2 25 07/19/2021   Lab Results  Component Value Date   ALT 18 07/19/2021   AST 20 07/19/2021   ALKPHOS 129 (H) 07/19/2021   BILITOT 0.5 07/19/2021   Lab Results  Component Value Date   HGBA1C 5.6 07/19/2021   HGBA1C 5.7 03/04/2021   HGBA1C 5.8 (H) 10/28/2020   HGBA1C 5.8 (H) 03/17/2020   HGBA1C 6.5 (H) 09/09/2019   Lab Results  Component Value Date   INSULIN 16.4 07/19/2021   INSULIN 12.3 03/17/2020   INSULIN 15.4 09/09/2019   Lab Results  Component Value Date   TSH 0.73 03/04/2021   Lab Results  Component Value Date   CHOL 151 03/04/2021   HDL 58 03/04/2021   LDLCALC 72 03/04/2021   TRIG 119 03/04/2021   CHOLHDL 2.6 03/17/2020   Lab Results  Component Value Date   VD25OH 42.9 07/19/2021   VD25OH 46.6 03/04/2021   VD25OH 47.9 10/28/2020   Lab Results  Component Value Date   WBC 7.4 03/04/2021   HGB 13.3 03/04/2021   HCT 39 03/04/2021   MCV 91 09/09/2019   PLT 275 03/04/2021   No results found for: "IRON", "TIBC", "FERRITIN"  Attestation Statements:   Reviewed by clinician on day of visit: allergies, medications, problem list, medical history, surgical history, family history, social history, and previous encounter notes.  I, Davy Pique, RMA, am acting as Location manager for Mina Marble, NP.  I have reviewed the above documentation for accuracy and completeness, and I agree with the  above. -  ***

## 2021-11-09 ENCOUNTER — Telehealth (HOSPITAL_COMMUNITY): Payer: Self-pay

## 2021-11-09 ENCOUNTER — Other Ambulatory Visit (HOSPITAL_COMMUNITY): Payer: Self-pay | Admitting: Interventional Radiology

## 2021-11-09 DIAGNOSIS — I771 Stricture of artery: Secondary | ICD-10-CM

## 2021-11-09 NOTE — Telephone Encounter (Signed)
Called to schedule mrv, no answer, left vm. AW

## 2021-11-23 ENCOUNTER — Telehealth (HOSPITAL_COMMUNITY): Payer: Self-pay

## 2021-11-23 NOTE — Telephone Encounter (Signed)
-----   Message from Ascencion Dike, PA-C sent at 11/18/2021 10:53 AM EDT ----- Regarding: RE: MRV? Dev said this is fine. Thanks ----- Message ----- From: Danielle Dess Sent: 11/17/2021   8:33 AM EDT To: Ascencion Dike, PA-C Subject: MRV?                                           [place],   I just schedule this patient for her MRV. She told me that she now as braces and wanted to know if it would be ok to still get this done. I checked with MRI and they said that they can scan her on their 1.5 scanner but to make sure that D is aware that it would be sub optimal due to her braces. Does he still want this or different imaging?  Thanks,  Lia Foyer

## 2021-12-06 ENCOUNTER — Encounter (INDEPENDENT_AMBULATORY_CARE_PROVIDER_SITE_OTHER): Payer: Self-pay | Admitting: Adult Health

## 2021-12-06 ENCOUNTER — Ambulatory Visit (INDEPENDENT_AMBULATORY_CARE_PROVIDER_SITE_OTHER): Payer: BC Managed Care – PPO | Admitting: Adult Health

## 2021-12-06 VITALS — BP 127/88 | HR 66 | Temp 98.1°F | Ht 65.0 in | Wt 253.0 lb

## 2021-12-06 DIAGNOSIS — E669 Obesity, unspecified: Secondary | ICD-10-CM

## 2021-12-06 DIAGNOSIS — E559 Vitamin D deficiency, unspecified: Secondary | ICD-10-CM | POA: Diagnosis not present

## 2021-12-06 DIAGNOSIS — Z6841 Body Mass Index (BMI) 40.0 and over, adult: Secondary | ICD-10-CM

## 2021-12-06 DIAGNOSIS — E785 Hyperlipidemia, unspecified: Secondary | ICD-10-CM

## 2021-12-06 DIAGNOSIS — E1169 Type 2 diabetes mellitus with other specified complication: Secondary | ICD-10-CM | POA: Diagnosis not present

## 2021-12-06 DIAGNOSIS — Z7985 Long-term (current) use of injectable non-insulin antidiabetic drugs: Secondary | ICD-10-CM

## 2021-12-06 MED ORDER — OZEMPIC (2 MG/DOSE) 8 MG/3ML ~~LOC~~ SOPN: 2.0000 mg | PEN_INJECTOR | SUBCUTANEOUS | 0 refills | Status: DC

## 2021-12-07 ENCOUNTER — Ambulatory Visit (HOSPITAL_COMMUNITY)
Admission: RE | Admit: 2021-12-07 | Discharge: 2021-12-07 | Disposition: A | Discharge status: Home / Self Care | Payer: BC Managed Care – PPO | Source: Ambulatory Visit | Attending: Interventional Radiology | Admitting: Interventional Radiology

## 2021-12-07 DIAGNOSIS — I771 Stricture of artery: Secondary | ICD-10-CM | POA: Diagnosis present

## 2021-12-07 LAB — COMPREHENSIVE METABOLIC PANEL
ALT: 18 IU/L (ref 0–32)
AST: 26 IU/L (ref 0–40)
Albumin/Globulin Ratio: 1.4 (ref 1.2–2.2)
Albumin: 4.1 g/dL (ref 3.8–4.9)
Alkaline Phosphatase: 130 IU/L — ABNORMAL HIGH (ref 44–121)
BUN/Creatinine Ratio: 9 (ref 9–23)
BUN: 9 mg/dL (ref 6–24)
Bilirubin Total: 0.5 mg/dL (ref 0.0–1.2)
CO2: 27 mmol/L (ref 20–29)
Calcium: 9.5 mg/dL (ref 8.7–10.2)
Chloride: 102 mmol/L (ref 96–106)
Creatinine, Ser: 0.98 mg/dL (ref 0.57–1.00)
Globulin, Total: 3 g/dL (ref 1.5–4.5)
Glucose: 87 mg/dL (ref 70–99)
Potassium: 3.8 mmol/L (ref 3.5–5.2)
Sodium: 142 mmol/L (ref 134–144)
Total Protein: 7.1 g/dL (ref 6.0–8.5)
eGFR: 69 mL/min/{1.73_m2} (ref >59)

## 2021-12-07 LAB — HEMOGLOBIN A1C
Est. average glucose Bld gHb Est-mCnc: 117 mg/dL
Hgb A1c MFr Bld: 5.7 % — ABNORMAL HIGH (ref 4.8–5.6)

## 2021-12-07 LAB — INSULIN, RANDOM: INSULIN: 7.7 u[IU]/mL (ref 2.6–24.9)

## 2021-12-07 LAB — LIPID PANEL
Chol/HDL Ratio: 2.2 ratio (ref 0.0–4.4)
Cholesterol, Total: 135 mg/dL (ref 100–199)
HDL: 62 mg/dL (ref >39)
LDL Chol Calc (NIH): 58 mg/dL (ref 0–99)
Triglycerides: 73 mg/dL (ref 0–149)
VLDL Cholesterol Cal: 15 mg/dL (ref 5–40)

## 2021-12-07 LAB — VITAMIN D 25 HYDROXY (VIT D DEFICIENCY, FRACTURES): Vit D, 25-Hydroxy: 57.6 ng/mL (ref 30.0–100.0)

## 2021-12-07 MED ORDER — GADOPICLENOL 0.5 MMOL/ML IV SOLN
10.0000 mL | Freq: Once | INTRAVENOUS | Status: AC | PRN
  Administered 2021-12-07: 10 mL via INTRAVENOUS

## 2021-12-07 NOTE — Progress Notes (Unsigned)
Chief Complaint:   OBESITY Vanessa Dean is here to discuss her progress with her obesity treatment plan along with follow-up of her obesity related diagnoses. Landa is on the Category 1 Plan + 100 and states she is following her eating plan approximately 60% of the time. Rilyn states she is doing nothing specific.   Today's visit was #: 61 Starting weight: 274 lbs Starting date: 09/09/2019 Today's weight: 253 lbs Today's date: 12/06/2021 Total lbs lost to date: 21 lbs Total lbs lost since last in-office visit: 3 lbs  Interim History: Three weeks ago she got upper and lower braces.  Muscle mass +0.8 lbs, adipose mass -4 lbs.   Subjective:   1. Type 2 diabetes mellitus with other specified complication, without long-term current use of insulin (HCC) A1c ***  2. Hyperlipidemia associated with type 2 diabetes mellitus ([place]) She is taking Lipitor 10 mg daily.   3. Vitamin D deficiency She is taking a daily OTC multivitamin.  Assessment/Plan:   1. Type 2 diabetes mellitus with other specified complication, without long-term current use of insulin (HCC) Refill - Semaglutide, 2 MG/DOSE, (OZEMPIC, 2 MG/DOSE,) 8 MG/3ML SOPN; Inject 2 mg into the skin once a week.  Dispense: 9 mL; Refill: 0  Check labs today.   - Comprehensive metabolic panel - Hemoglobin A1c - Insulin, random  2. Hyperlipidemia associated with type 2 diabetes mellitus ([place]) Check labs today.   - Lipid panel  3. Vitamin D deficiency Check labs today.   - VITAMIN D 25 Hydroxy (Vit-D Deficiency, Fractures)  4. Obesity, current BMI 42.1 Lindzey is currently in the action stage of change. As such, her goal is to continue with weight loss efforts. She has agreed to the Category 1 Plan + 100 calories.   Exercise goals:  As is.   Behavioral modification strategies: increasing lean protein intake, decreasing simple carbohydrates, meal planning and cooking strategies, keeping healthy foods in the home, and planning for  success.  Vanessa Dean has agreed to follow-up with our clinic in 4 weeks. She was informed of the importance of frequent follow-up visits to maximize her success with intensive lifestyle modifications for her multiple health conditions.   Vanessa Dean was informed we would discuss her lab results at her next visit unless there is a critical issue that needs to be addressed sooner. Vanessa Dean agreed to keep her next visit at the agreed upon time to discuss these results.  Objective:   Blood pressure 127/88, pulse 66, temperature 98.1 F (36.7 C), height 5' 5"  (1.651 m), weight 253 lb (114.8 kg), SpO2 99 %. Body mass index is 42.1 kg/m.  General: Cooperative, alert, well developed, in no acute distress. HEENT: Conjunctivae and lids unremarkable. Cardiovascular: Regular rhythm.  Lungs: Normal work of breathing. Neurologic: No focal deficits.   Lab Results  Component Value Date   CREATININE 0.98 12/06/2021   BUN 9 12/06/2021   NA 142 12/06/2021   K 3.8 12/06/2021   CL 102 12/06/2021   CO2 27 12/06/2021   Lab Results  Component Value Date   ALT 18 12/06/2021   AST 26 12/06/2021   ALKPHOS 130 (H) 12/06/2021   BILITOT 0.5 12/06/2021   Lab Results  Component Value Date   HGBA1C 5.7 (H) 12/06/2021   HGBA1C 5.6 07/19/2021   HGBA1C 5.7 03/04/2021   HGBA1C 5.8 (H) 10/28/2020   HGBA1C 5.8 (H) 03/17/2020   Lab Results  Component Value Date   INSULIN 7.7 12/06/2021   INSULIN 16.4 07/19/2021  INSULIN 12.3 03/17/2020   INSULIN 15.4 09/09/2019   Lab Results  Component Value Date   TSH 0.73 03/04/2021   Lab Results  Component Value Date   CHOL 135 12/06/2021   HDL 62 12/06/2021   LDLCALC 58 12/06/2021   TRIG 73 12/06/2021   CHOLHDL 2.2 12/06/2021   Lab Results  Component Value Date   VD25OH 57.6 12/06/2021   VD25OH 42.9 07/19/2021   VD25OH 46.6 03/04/2021   Lab Results  Component Value Date   WBC 7.4 03/04/2021   HGB 13.3 03/04/2021   HCT 39 03/04/2021   MCV 91 09/09/2019   PLT  275 03/04/2021   No results found for: "IRON", "TIBC", "FERRITIN"  Attestation Statements:   Reviewed by clinician on day of visit: allergies, medications, problem list, medical history, surgical history, family history, social history, and previous encounter notes.  I, Davy Pique, RMA, am acting as Location manager for Mina Marble, NP.  I have reviewed the above documentation for accuracy and completeness, and I agree with the above. -  ***

## 2021-12-13 ENCOUNTER — Other Ambulatory Visit (INDEPENDENT_AMBULATORY_CARE_PROVIDER_SITE_OTHER): Payer: Self-pay | Admitting: Adult Health

## 2021-12-13 DIAGNOSIS — E1169 Type 2 diabetes mellitus with other specified complication: Secondary | ICD-10-CM

## 2021-12-14 ENCOUNTER — Telehealth (HOSPITAL_COMMUNITY): Payer: Self-pay

## 2021-12-14 ENCOUNTER — Encounter (INDEPENDENT_AMBULATORY_CARE_PROVIDER_SITE_OTHER): Payer: Self-pay | Admitting: Adult Health

## 2021-12-14 NOTE — Telephone Encounter (Signed)
Pt agreed to f/u in 2 years with an mrv head w/wo. AW

## 2021-12-20 ENCOUNTER — Other Ambulatory Visit (INDEPENDENT_AMBULATORY_CARE_PROVIDER_SITE_OTHER): Payer: Self-pay | Admitting: Adult Health

## 2021-12-20 DIAGNOSIS — E1159 Type 2 diabetes mellitus with other circulatory complications: Secondary | ICD-10-CM

## 2022-01-04 ENCOUNTER — Ambulatory Visit (INDEPENDENT_AMBULATORY_CARE_PROVIDER_SITE_OTHER): Payer: BC Managed Care – PPO | Admitting: Family Medicine

## 2022-03-01 ENCOUNTER — Ambulatory Visit
Admission: EM | Admit: 2022-03-01 | Discharge: 2022-03-01 | Discharge status: Absent without leave | Payer: BC Managed Care – PPO

## 2022-03-02 ENCOUNTER — Encounter (INDEPENDENT_AMBULATORY_CARE_PROVIDER_SITE_OTHER): Payer: Self-pay | Admitting: Adult Health

## 2022-04-27 ENCOUNTER — Encounter (INDEPENDENT_AMBULATORY_CARE_PROVIDER_SITE_OTHER): Payer: Self-pay | Admitting: Adult Health

## 2022-04-27 ENCOUNTER — Ambulatory Visit (INDEPENDENT_AMBULATORY_CARE_PROVIDER_SITE_OTHER): Payer: BC Managed Care – PPO | Admitting: Adult Health

## 2022-04-27 VITALS — BP 136/87 | HR 68 | Temp 97.8°F | Ht 65.0 in | Wt 271.0 lb

## 2022-04-27 DIAGNOSIS — E669 Obesity, unspecified: Secondary | ICD-10-CM

## 2022-04-27 DIAGNOSIS — E785 Hyperlipidemia, unspecified: Secondary | ICD-10-CM

## 2022-04-27 DIAGNOSIS — E1169 Type 2 diabetes mellitus with other specified complication: Secondary | ICD-10-CM | POA: Diagnosis not present

## 2022-04-27 DIAGNOSIS — E559 Vitamin D deficiency, unspecified: Secondary | ICD-10-CM | POA: Diagnosis not present

## 2022-04-27 DIAGNOSIS — Z6841 Body Mass Index (BMI) 40.0 and over, adult: Secondary | ICD-10-CM

## 2022-04-27 DIAGNOSIS — Z7985 Long-term (current) use of injectable non-insulin antidiabetic drugs: Secondary | ICD-10-CM

## 2022-04-27 MED ORDER — OZEMPIC (0.25 OR 0.5 MG/DOSE) 2 MG/1.5ML ~~LOC~~ SOPN: 0.2500 mg | PEN_INJECTOR | SUBCUTANEOUS | 0 refills | Status: DC

## 2022-04-27 NOTE — Progress Notes (Addendum)
Chief Complaint:   OBESITY Vanessa Dean is here to discuss her progress with her obesity treatment plan along with follow-up of her obesity related diagnoses. [name] is on the Category 1 Plan and states she is following her eating plan approximately 20% of the time.  Vanessa Dean states she is not currently exercising.  Today's visit was #: 30 Starting weight: 274 lbs Starting date: 09/09/19 Today's weight: 271 lbs Today's date: 04/27/2022 Total lbs lost to date: 3 Total lbs lost since last in-office visit: + 18  Interim History:  Last OV with [facility] - 12/06/21 She was on Cat 1 meal plan with additional 100 cal  Since OCt 2023, she reports- 1) Increased work responsibilities and stress 2) She had COVID-19 infection 3) she experienced and episode of Vertigo 4) Her daughter gave birth the healthy baby girl- "Em[place]" on 02/28/22 4) HOLIDAYS  She is ready to refocus today!  Subjective:   1. Type 2 diabetes mellitus with other specified complication, without long-term current use of insulin (HCC) Discussed Labs Lab Results  Component Value Date   HGBA1C 5.7 (H) 12/06/2021   HGBA1C 5.6 07/19/2021   HGBA1C 5.7 03/04/2021   03/28/22 A1c drawn by PCP/[name]- 6.2- stable  She has been off Ozempic 2mg  since Oct 2023- due to national drug shortage.  She has been on Metformin in past, she prefers to remain off.  2. Hyperlipidemia associated with type 2 diabetes mellitus [facility]) Discussed Labs Lipid Panel     Component Value Date/Time   CHOL 135 12/06/2021 0804   TRIG 73 12/06/2021 0804   HDL 62 12/06/2021 0804   CHOLHDL 2.2 12/06/2021 0804   LDLCALC 58 12/06/2021 0804   LABVLDL 15 12/06/2021 0804  She is on daily Lipitor 10mg - tolerating well. LDLat goal for diabetic.  3. Vitamin D deficiency Discussed Labs  Latest Reference Range & Units 12/06/21 08:04  Vitamin D, 25-Hydroxy 30.0 - 100.0 ng/mL 57.6  She is on OTC Multivitamin  Assessment/Plan:   1. Type 2 diabetes mellitus with  other specified complication, without long-term current use of insulin (HCC) RESTART Ozempic 0.25mg  once weekly injection Disp 40ml RF 0  2. Hyperlipidemia associated with type 2 diabetes mellitus ([place]) Continue statin therapy as directed.  3. Vitamin D deficiency Continue OTC supplement  5. Obesity, current BMI 45.2  Vanessa Dean is currently in the action stage of change. As such, her goal is to continue with weight loss efforts. She has agreed to the Category 1 Plan.   Exercise goals: For substantial health benefits, adults should do at least 150 minutes (2 hours and 30 minutes) a week of moderate-intensity, or 75 minutes (1 hour and 15 minutes) a week of vigorous-intensity aerobic physical activity, or an equivalent combination of moderate- and vigorous-intensity aerobic activity. Aerobic activity should be performed in episodes of at least 10 minutes, and preferably, it should be spread throughout the week.  Behavioral modification strategies: increasing lean protein intake, decreasing simple carbohydrates, increasing vegetables, increasing water intake, no skipping meals, and planning for success.  Lilliah has agreed to follow-up with our clinic in 4 weeks. She was informed of the importance of frequent follow-up visits to maximize her success with intensive lifestyle modifications for her multiple health conditions.  \ Objective:   Blood pressure 136/87, pulse 68, temperature 97.8 F (36.6 C), height 5\' 5"  (1.651 m), weight 271 lb (122.9 kg), SpO2 98 %. Body mass index is 45.1 kg/m.  General: Cooperative, alert, well developed, in no acute distress.  HEENT: Conjunctivae and lids unremarkable. Cardiovascular: Regular rhythm.  Lungs: Normal work of breathing. Neurologic: No focal deficits.   Lab Results  Component Value Date   CREATININE 0.98 12/06/2021   BUN 9 12/06/2021   NA 142 12/06/2021   K 3.8 12/06/2021   CL 102 12/06/2021   CO2 27 12/06/2021   Lab Results  Component Value  Date   ALT 18 12/06/2021   AST 26 12/06/2021   ALKPHOS 130 (H) 12/06/2021   BILITOT 0.5 12/06/2021   Lab Results  Component Value Date   HGBA1C 5.7 (H) 12/06/2021   HGBA1C 5.6 07/19/2021   HGBA1C 5.7 03/04/2021   HGBA1C 5.8 (H) 10/28/2020   HGBA1C 5.8 (H) 03/17/2020   Lab Results  Component Value Date   INSULIN 7.7 12/06/2021   INSULIN 16.4 07/19/2021   INSULIN 12.3 03/17/2020   INSULIN 15.4 09/09/2019   Lab Results  Component Value Date   TSH 0.73 03/04/2021   Lab Results  Component Value Date   CHOL 135 12/06/2021   HDL 62 12/06/2021   LDLCALC 58 12/06/2021   TRIG 73 12/06/2021   CHOLHDL 2.2 12/06/2021   Lab Results  Component Value Date   VD25OH 57.6 12/06/2021   VD25OH 42.9 07/19/2021   VD25OH 46.6 03/04/2021   Lab Results  Component Value Date   WBC 7.4 03/04/2021   HGB 13.3 03/04/2021   HCT 39 03/04/2021   MCV 91 09/09/2019   PLT 275 03/04/2021   No results found for: "IRON", "TIBC", "FERRITIN"  Attestation Statements:   Reviewed by clinician on day of visit: allergies, medications, problem list, medical history, surgical history, family history, social history, and previous encounter notes.  I have reviewed the above documentation for accuracy and completeness, and I agree with the above. -  Rhys Anchondo d. Kemper Heupel, NP-C

## 2022-05-01 ENCOUNTER — Telehealth (INDEPENDENT_AMBULATORY_CARE_PROVIDER_SITE_OTHER): Payer: Self-pay

## 2022-05-01 NOTE — Telephone Encounter (Signed)
PA submitted for Ozempic, @ 2:58 on 05/01/22, waiting for a determination.  PA for Ozempic was denied stating: Your results were not in the approvable range

## 2022-05-05 ENCOUNTER — Encounter (INDEPENDENT_AMBULATORY_CARE_PROVIDER_SITE_OTHER): Payer: Self-pay | Admitting: Adult Health

## 2022-05-25 ENCOUNTER — Telehealth (INDEPENDENT_AMBULATORY_CARE_PROVIDER_SITE_OTHER): Payer: Self-pay | Admitting: Adult Health

## 2022-05-25 ENCOUNTER — Encounter (INDEPENDENT_AMBULATORY_CARE_PROVIDER_SITE_OTHER): Payer: Self-pay | Admitting: Adult Health

## 2022-05-25 ENCOUNTER — Ambulatory Visit (INDEPENDENT_AMBULATORY_CARE_PROVIDER_SITE_OTHER): Payer: BC Managed Care – PPO | Admitting: Adult Health

## 2022-05-25 VITALS — BP 133/87 | HR 65 | Temp 97.6°F | Ht 65.0 in | Wt 271.0 lb

## 2022-05-25 DIAGNOSIS — E669 Obesity, unspecified: Secondary | ICD-10-CM

## 2022-05-25 DIAGNOSIS — Z7985 Long-term (current) use of injectable non-insulin antidiabetic drugs: Secondary | ICD-10-CM

## 2022-05-25 DIAGNOSIS — E1169 Type 2 diabetes mellitus with other specified complication: Secondary | ICD-10-CM | POA: Diagnosis not present

## 2022-05-25 DIAGNOSIS — E559 Vitamin D deficiency, unspecified: Secondary | ICD-10-CM | POA: Diagnosis not present

## 2022-05-25 DIAGNOSIS — Z6841 Body Mass Index (BMI) 40.0 and over, adult: Secondary | ICD-10-CM

## 2022-05-25 MED ORDER — OZEMPIC (0.25 OR 0.5 MG/DOSE) 2 MG/1.5ML ~~LOC~~ SOPN: 0.2500 mg | PEN_INJECTOR | SUBCUTANEOUS | 0 refills | Status: DC

## 2022-05-25 NOTE — Progress Notes (Signed)
WEIGHT SUMMARY AND BIOMETRICS  Vitals Temp: 97.6 F (36.4 C) BP: 133/87 Pulse Rate: 65 SpO2: 98 %   Anthropometric Measurements Height: 5\' 5"  (1.651 m) Weight: 271 lb (122.9 kg) BMI (Calculated): 45.1 Weight at Last Visit: 271lb Weight Lost Since Last Visit: 0 Weight Gained Since Last Visit: 0 Starting Weight: 274lb Total Weight Loss (lbs): 3 lb (1.361 kg)   Body Composition  Body Fat %: 48.7 % Fat Mass (lbs): 132.2 lbs Muscle Mass (lbs): 132.4 lbs Total Body Water (lbs): 107 lbs Visceral Fat Rating : 17   Other Clinical Data Fasting: Yes Labs: No Today's Visit #: 31 Starting Date: 09/09/19    Chief Complaint:   OBESITY Vanessa Dean is here to discuss her progress with her obesity treatment plan. She is on the the Category 1 Plan + 100 and states she is following her eating plan approximately 50 % of the time.  She states she is not currently exercising.   Interim History:  Since has last OV- her daughters have been ill with pneumonia and influenza. One daughter has Lupus Vanessa Dean)  One daughter has [place] Vanessa Dean) Her insurance denied re-start of Ozempic 0.25mg - due to her A1c being <6.5  Subjective:   1. Type 2 diabetes mellitus with other specified complication, without long-term current use of insulin (HCC) Lab Results  Component Value Date   HGBA1C 5.7 (H) 12/06/2021   HGBA1C 5.6 07/19/2021   HGBA1C 5.7 03/04/2021   Jan 2024 A1c 6.2 with [name] at [facility] She has been off max dose of Ozempic 2mg  since Oct 2023 due to [employer]. 04/27/2022 Her insurance denied re-start of Ozempic 0.25mg - due to her A1c being <6.5  2. Vitamin D deficiency Vitamin D 25(OH) Total Reviewed date:03/28/2022 02:45:00 PM Interpretation:45.9 Performing Lab: Notes/Report: Testing Performed at: [facility], [address], [address], [place], [place] [address]  25(OH) Vit D, Total 45.9 30.0-100.0 ng/mL   She is on OTC Vit D3 supplement- unsure of  dosage.  Assessment/Plan:   1. Type 2 diabetes mellitus with other specified complication, without long-term current use of insulin (HCC) Re-start Ozempic 0.25mg  once weekly injection Disp 54ml RF 0  2. Vitamin D deficiency Provide OTC Vit D 3 dosage at next OV  3. Obesity, current BMI 45.1  Vanessa Dean is currently in the action stage of change. As such, her goal is to continue with weight loss efforts. She has agreed to the Category 1 Plan. + 100  Exercise goals:  Walk 2 x week  Behavioral modification strategies: increasing lean protein intake, decreasing simple carbohydrates, increasing vegetables, increasing water intake, and planning for success.  Vanessa Dean has agreed to follow-up with our clinic in 4 weeks. She was informed of the importance of frequent follow-up visits to maximize her success with intensive lifestyle modifications for her multiple health conditions.    Objective:   Blood pressure 133/87, pulse 65, temperature 97.6 F (36.4 C), height 5\' 5"  (1.651 m), weight 271 lb (122.9 kg), SpO2 98 %. Body mass index is 45.1 kg/m.  General: Cooperative, alert, well developed, in no acute distress. HEENT: Conjunctivae and lids unremarkable. Cardiovascular: Regular rhythm.  Lungs: Normal work of breathing. Neurologic: No focal deficits.   Lab Results  Component Value Date   CREATININE 0.98 12/06/2021   BUN 9 12/06/2021   NA 142 12/06/2021   K 3.8 12/06/2021   CL 102 12/06/2021   CO2 27 12/06/2021   Lab Results  Component Value Date  ALT 18 12/06/2021   AST 26 12/06/2021   ALKPHOS 130 (H) 12/06/2021   BILITOT 0.5 12/06/2021   Lab Results  Component Value Date   HGBA1C 5.7 (H) 12/06/2021   HGBA1C 5.6 07/19/2021   HGBA1C 5.7 03/04/2021   HGBA1C 5.8 (H) 10/28/2020   HGBA1C 5.8 (H) 03/17/2020   Lab Results  Component Value Date   INSULIN 7.7 12/06/2021   INSULIN 16.4 07/19/2021   INSULIN 12.3 03/17/2020   INSULIN 15.4 09/09/2019   Lab Results  Component  Value Date   TSH 0.73 03/04/2021   Lab Results  Component Value Date   CHOL 135 12/06/2021   HDL 62 12/06/2021   LDLCALC 58 12/06/2021   TRIG 73 12/06/2021   CHOLHDL 2.2 12/06/2021   Lab Results  Component Value Date   VD25OH 57.6 12/06/2021   VD25OH 42.9 07/19/2021   VD25OH 46.6 03/04/2021   Lab Results  Component Value Date   WBC 7.4 03/04/2021   HGB 13.3 03/04/2021   HCT 39 03/04/2021   MCV 91 09/09/2019   PLT 275 03/04/2021   No results found for: "IRON", "TIBC", "FERRITIN"  Attestation Statements:   Reviewed by clinician on day of visit: allergies, medications, problem list, medical history, surgical history, family history, social history, and previous encounter notes.  I have reviewed the above documentation for accuracy and completeness, and I agree with the above. -  Vanessa Dean d. Vanessa [place], NP-C

## 2022-05-25 NOTE — Telephone Encounter (Addendum)
A 2nd PA with documentaion was submitted today for Ozempic. The first was denied so I submitted a second with her labs. Waiting on a determination. 05/25/22  PA for Ozempic was approved from 05/25/22 until 05/24/25

## 2022-05-29 ENCOUNTER — Encounter (INDEPENDENT_AMBULATORY_CARE_PROVIDER_SITE_OTHER): Payer: Self-pay

## 2022-06-05 ENCOUNTER — Encounter (INDEPENDENT_AMBULATORY_CARE_PROVIDER_SITE_OTHER): Payer: Self-pay | Admitting: Adult Health

## 2022-06-29 ENCOUNTER — Encounter (INDEPENDENT_AMBULATORY_CARE_PROVIDER_SITE_OTHER): Payer: Self-pay | Admitting: Adult Health

## 2022-06-29 ENCOUNTER — Ambulatory Visit (INDEPENDENT_AMBULATORY_CARE_PROVIDER_SITE_OTHER): Payer: BC Managed Care – PPO | Admitting: Adult Health

## 2022-06-29 VITALS — BP 129/87 | HR 73 | Temp 97.9°F | Ht 65.0 in | Wt 269.0 lb

## 2022-06-29 DIAGNOSIS — E1169 Type 2 diabetes mellitus with other specified complication: Secondary | ICD-10-CM

## 2022-06-29 DIAGNOSIS — Z7985 Long-term (current) use of injectable non-insulin antidiabetic drugs: Secondary | ICD-10-CM

## 2022-06-29 DIAGNOSIS — E559 Vitamin D deficiency, unspecified: Secondary | ICD-10-CM

## 2022-06-29 DIAGNOSIS — E66813 Obesity, class 3: Secondary | ICD-10-CM

## 2022-06-29 DIAGNOSIS — E669 Obesity, unspecified: Secondary | ICD-10-CM | POA: Diagnosis not present

## 2022-06-29 DIAGNOSIS — Z6841 Body Mass Index (BMI) 40.0 and over, adult: Secondary | ICD-10-CM

## 2022-06-29 MED ORDER — SEMAGLUTIDE(0.25 OR 0.5MG/DOS) 2 MG/3ML ~~LOC~~ SOPN: 0.5000 mg | PEN_INJECTOR | SUBCUTANEOUS | 0 refills | Status: DC

## 2022-06-29 NOTE — Progress Notes (Signed)
WEIGHT SUMMARY AND BIOMETRICS  Vitals Temp: 97.9 F (36.6 C) BP: 129/87 Pulse Rate: 73 SpO2: 100 %   Anthropometric Measurements Height:  (1.651 m) Weight: 269 lb (122 kg) BMI (Calculated): 44.76 Weight at Last Visit: 271lb Weight Lost Since Last Visit: 2lb Weight Gained Since Last Visit: 0 Starting Weight: 274lb Total Weight Loss (lbs): 5 lb (2.268 kg)   Body Composition  Body Fat %: 48.3 % Fat Mass (lbs): 130.4 lbs Muscle Mass (lbs): 132.4 lbs Total Body Water (lbs): 102.6 lbs Visceral Fat Rating : 16   Other Clinical Data Fasting: ys Labs: no Today's Visit #: 32 Starting Date: 09/09/19    Chief Complaint:   OBESITY Vanessa Dean is here to discuss her progress with her obesity treatment plan. She is on the the Category 1 Plan +100 and states she is following her eating plan approximately 60 % of the time.  She states she is exercising by increasing daily movement.   Interim History:  She was able to re-start Ozempic therapy - she has 4 doses of Ozempic 0.25mg  Denies mass in neck, dysphagia, dyspepsia, persistent hoarseness, abdominal pain, or N/V/C   Vanessa Dean has been increasing daily steps/activity by walking during breaks at work.  Subjective:   1. Type 2 diabetes mellitus with other specified complication, without long-term current use of insulin Lab Results  Component Value Date   HGBA1C 5.7 (H) 12/06/2021   HGBA1C 5.6 07/19/2021   HGBA1C 5.7 03/04/2021    She was able to re-start Ozempic therapy - she has 4 doses of Ozempic 0.25mg  Denies mass in neck, dysphagia, dyspepsia, persistent hoarseness, abdominal pain, or N/V/C   2. Vitamin D deficiency She is taking an  OTC Vit D 3 supplement- unsure of strength Reviewed date:03/28/2022 02:45:00 PM Interpretation:45.9   Assessment/Plan:   1. Type 2 diabetes mellitus with other specified complication, without long-term current use of insulin Refill and increase Ozempic 0.5mg  Disp 2 ml RF  0 Do not overeat with increased GLP-1 therapy.  2. Vitamin D deficiency Continue OTC supplementation.  3. Obesity, current BMI 44.76  [name] is currently in the action stage of change. As such, her goal is to continue with weight loss efforts. She has agreed to the Category 1 Plan. +100  Exercise goals: All adults should avoid inactivity. Some physical activity is better than none, and adults who participate in any amount of physical activity gain some health benefits.  Behavioral modification strategies: increasing lean protein intake, decreasing simple carbohydrates, increasing vegetables, increasing water intake, decreasing liquid calories, no skipping meals, meal planning and cooking strategies, and planning for success.  [name] has agreed to follow-up with our clinic in 4 weeks. She was informed of the importance of frequent follow-up visits to maximize her success with intensive lifestyle modifications for her multiple health conditions.   Objective:   Blood pressure 129/87, pulse 73, temperature 97.9 F (36.6 C), height  (1.651 m), weight 269 lb (122 kg), SpO2 100 %. Body mass index is 44.76 kg/m.  General: Cooperative, alert, well developed, in no acute distress. HEENT: Conjunctivae and lids unremarkable. Cardiovascular: Regular rhythm.  Lungs: Normal work of breathing. Neurologic: No focal deficits.   Lab Results  Component Value Date   CREATININE 0.98 12/06/2021   BUN 9 12/06/2021   NA 142 12/06/2021   K 3.8 12/06/2021   CL 102 12/06/2021   CO2 27 12/06/2021   Lab Results  Component Value Date   ALT 18 12/06/2021  AST 26 12/06/2021   ALKPHOS 130 (H) 12/06/2021   BILITOT 0.5 12/06/2021   Lab Results  Component Value Date   HGBA1C 5.7 (H) 12/06/2021   HGBA1C 5.6 07/19/2021   HGBA1C 5.7 03/04/2021   HGBA1C 5.8 (H) 10/28/2020   HGBA1C 5.8 (H) 03/17/2020   Lab Results  Component Value Date   INSULIN 7.7 12/06/2021   INSULIN 16.4 07/19/2021   INSULIN  12.3 03/17/2020   INSULIN 15.4 09/09/2019   Lab Results  Component Value Date   TSH 0.73 03/04/2021   Lab Results  Component Value Date   CHOL 135 12/06/2021   HDL 62 12/06/2021   LDLCALC 58 12/06/2021   TRIG 73 12/06/2021   CHOLHDL 2.2 12/06/2021   Lab Results  Component Value Date   VD25OH 57.6 12/06/2021   VD25OH 42.9 07/19/2021   VD25OH 46.6 03/04/2021   Lab Results  Component Value Date   WBC 7.4 03/04/2021   HGB 13.3 03/04/2021   HCT 39 03/04/2021   MCV 91 09/09/2019   PLT 275 03/04/2021   No results found for: "IRON", "TIBC", "FERRITIN"  Attestation Statements:   Reviewed by clinician on day of visit: allergies, medications, problem list, medical history, surgical history, family history, social history, and previous encounter notes.  I have reviewed the above documentation for accuracy and completeness, and I agree with the above. -  Jenita Rayfield d. Brando Taves, NP-C

## 2022-07-09 ENCOUNTER — Other Ambulatory Visit (INDEPENDENT_AMBULATORY_CARE_PROVIDER_SITE_OTHER): Payer: Self-pay | Admitting: Adult Health

## 2022-07-10 ENCOUNTER — Encounter (INDEPENDENT_AMBULATORY_CARE_PROVIDER_SITE_OTHER): Payer: Self-pay | Admitting: Adult Health

## 2022-07-10 ENCOUNTER — Other Ambulatory Visit (INDEPENDENT_AMBULATORY_CARE_PROVIDER_SITE_OTHER): Payer: Self-pay

## 2022-08-02 ENCOUNTER — Ambulatory Visit (INDEPENDENT_AMBULATORY_CARE_PROVIDER_SITE_OTHER): Payer: BC Managed Care – PPO | Admitting: Adult Health

## 2022-08-02 ENCOUNTER — Encounter (INDEPENDENT_AMBULATORY_CARE_PROVIDER_SITE_OTHER): Payer: Self-pay | Admitting: Adult Health

## 2022-08-02 VITALS — BP 119/82 | HR 83 | Temp 98.0°F | Ht 65.0 in | Wt 270.0 lb

## 2022-08-02 DIAGNOSIS — Z7985 Long-term (current) use of injectable non-insulin antidiabetic drugs: Secondary | ICD-10-CM | POA: Diagnosis not present

## 2022-08-02 DIAGNOSIS — E669 Obesity, unspecified: Secondary | ICD-10-CM | POA: Diagnosis not present

## 2022-08-02 DIAGNOSIS — Z566 Other physical and mental strain related to work: Secondary | ICD-10-CM

## 2022-08-02 DIAGNOSIS — E1169 Type 2 diabetes mellitus with other specified complication: Secondary | ICD-10-CM | POA: Diagnosis not present

## 2022-08-02 DIAGNOSIS — Z6841 Body Mass Index (BMI) 40.0 and over, adult: Secondary | ICD-10-CM | POA: Diagnosis not present

## 2022-08-02 MED ORDER — SEMAGLUTIDE(0.25 OR 0.5MG/DOS) 2 MG/3ML ~~LOC~~ SOPN: 0.5000 mg | PEN_INJECTOR | SUBCUTANEOUS | 0 refills | Status: DC

## 2022-08-02 NOTE — Progress Notes (Signed)
WEIGHT SUMMARY AND BIOMETRICS  Vitals Temp: 98 F (36.7 C) BP: 119/82 Pulse Rate: 83 SpO2: 98 %   Anthropometric Measurements Height: 5\' 5"  (1.651 m) Weight: 270 lb (122.5 kg) BMI (Calculated): 44.93 Weight at Last Visit: 269 lb Weight Lost Since Last Visit: 0 lb Weight Gained Since Last Visit: 1 lb Starting Weight: 274 lb   Body Composition  Body Fat %: 49.7 % Fat Mass (lbs): 134.2 lbs Muscle Mass (lbs): 129.2 lbs Total Body Water (lbs): 104 lbs Visceral Fat Rating : 17   Other Clinical Data Fasting: yes Labs: No Today's Visit #: 48 Starting Date: 09/09/19    Chief Complaint:   OBESITY Vanessa Dean is here to discuss her progress with her obesity treatment plan. She is on the the Category 1 Plan +100 and states she is following her eating plan approximately 50 % of the time. She states she is daily walking.   Interim History: Vanessa Dean has had 3 doses of Ozempic 0.5mg  Denies mass in neck, dysphagia, dyspepsia, persistent hoarseness, abdominal pain, or N/V. She reports increase in constipation SE  Hunger/appetite-appetite well controlled with Cat 1 meal and weekly Ozempic 1mg   Stress- She endorses significant increase in work stress  Museum/gallery curator around [employer]   Subjective:   1. Type 2 diabetes mellitus with other specified complication, without long-term current use of insulin (HCC) Lab Results  Component Value Date   HGBA1C 5.7 (H) 12/06/2021   HGBA1C 5.6 07/19/2021   HGBA1C 5.7 03/04/2021   Fasting CBG 90s She denies sx's of hypoglycemia Only SE from increased Ozempic 0.5mg - constipation. She has had 3 doses of 0.5mg  strength. She reports low appetite- rec staying at current GLP-1 dose.  2. 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 A new  Burnadette Peter has been named to [employer]. With this new announcement (at 1300 today)- expect position changes yet again. 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  Assessment/Plan:   1. Type 2 diabetes mellitus with other specified complication, without long-term current use of insulin (HCC) Refill Semaglutide,0.25 or 0.5MG /DOS, 2 MG/3ML SOPN Inject 0.5 mg into the skin once a week. Dispense: 3 mL, Refills: 0 ordered  Consider increasing at next OV  2. Stress at work Increase water and daily walking- rec start trackig daily steps Look into local [employer] for other professional opportunities  3. Obesity, current BMI 44.76  Vanessa Dean is currently in the action stage of change. As such, her goal is to continue with weight loss efforts. She has agreed to the Category 1 Plan. +100  Exercise goals:Increase daily walking, rec start tracking daily steps  Behavioral modification strategies: increasing lean protein intake, decreasing simple carbohydrates, increasing vegetables, increasing water intake, increasing high fiber foods, no skipping meals, meal planning and cooking strategies, and planning for success.  [name] has agreed to follow-up with our clinic in 4 weeks. She was informed of the importance of frequent follow-up visits to maximize her success with intensive lifestyle modifications for her multiple health conditions.   Check Fasting Labs at next OV  Objective:   Blood pressure 119/82, pulse 83, temperature 98 F (36.7 C), height 5\' 5"  (1.651 m), weight 270 lb (122.5 kg), SpO2 98 %. Body mass index is 44.93 kg/m.  General: Cooperative, alert, well developed, in  no acute distress. HEENT: Conjunctivae and lids unremarkable. Cardiovascular: Regular rhythm.  Lungs: Normal work of breathing. Neurologic: No focal deficits.   Lab Results  Component Value Date   CREATININE 0.98 12/06/2021   BUN 9 12/06/2021   NA 142 12/06/2021   K 3.8 12/06/2021    CL 102 12/06/2021   CO2 27 12/06/2021   Lab Results  Component Value Date   ALT 18 12/06/2021   AST 26 12/06/2021   ALKPHOS 130 (H) 12/06/2021   BILITOT 0.5 12/06/2021   Lab Results  Component Value Date   HGBA1C 5.7 (H) 12/06/2021   HGBA1C 5.6 07/19/2021   HGBA1C 5.7 03/04/2021   HGBA1C 5.8 (H) 10/28/2020   HGBA1C 5.8 (H) 03/17/2020   Lab Results  Component Value Date   INSULIN 7.7 12/06/2021   INSULIN 16.4 07/19/2021   INSULIN 12.3 03/17/2020   INSULIN 15.4 09/09/2019   Lab Results  Component Value Date   TSH 0.73 03/04/2021   Lab Results  Component Value Date   CHOL 135 12/06/2021   HDL 62 12/06/2021   LDLCALC 58 12/06/2021   TRIG 73 12/06/2021   CHOLHDL 2.2 12/06/2021   Lab Results  Component Value Date   VD25OH 57.6 12/06/2021   VD25OH 42.9 07/19/2021   VD25OH 46.6 03/04/2021   Lab Results  Component Value Date   WBC 7.4 03/04/2021   HGB 13.3 03/04/2021   HCT 39 03/04/2021   MCV 91 09/09/2019   PLT 275 03/04/2021   No results found for: "IRON", "TIBC", "FERRITIN"  Attestation Statements:   Reviewed by clinician on day of visit: allergies, medications, problem list, medical history, surgical history, family history, social history, and previous encounter notes.  I have reviewed the above documentation for accuracy and completeness, and I agree with the above. -  Vanessa Dean d. Vanessa Dupuis, NP-C

## 2022-08-11 ENCOUNTER — Ambulatory Visit: Payer: BC Managed Care – PPO | Admitting: Plastic Surgery

## 2022-09-02 ENCOUNTER — Ambulatory Visit (INDEPENDENT_AMBULATORY_CARE_PROVIDER_SITE_OTHER): Payer: BC Managed Care – PPO

## 2022-09-02 ENCOUNTER — Other Ambulatory Visit (INDEPENDENT_AMBULATORY_CARE_PROVIDER_SITE_OTHER): Payer: Self-pay | Admitting: Adult Health

## 2022-09-02 ENCOUNTER — Ambulatory Visit
Admission: EM | Admit: 2022-09-02 | Discharge: 2022-09-02 | Disposition: A | Discharge status: Home / Self Care | Payer: BC Managed Care – PPO | Attending: Internal Medicine | Admitting: Internal Medicine

## 2022-09-02 DIAGNOSIS — S92354A Nondisplaced fracture of fifth metatarsal bone, right foot, initial encounter for closed fracture: Secondary | ICD-10-CM

## 2022-09-02 DIAGNOSIS — W19XXXA Unspecified fall, initial encounter: Secondary | ICD-10-CM | POA: Diagnosis not present

## 2022-09-02 DIAGNOSIS — M79662 Pain in left lower leg: Secondary | ICD-10-CM | POA: Diagnosis not present

## 2022-09-02 MED ORDER — TRAMADOL HCL 50 MG PO TABS: 50.0000 mg | ORAL_TABLET | Freq: Four times a day (QID) | ORAL | 0 refills | Status: DC | PRN

## 2022-09-02 NOTE — Discharge Instructions (Signed)
You have broken your foot.  Boot was applied.  Crutches were supplied as well.  Advised elevation and ice application.  Follow-up with orthopedist on Monday to schedule appointment for further evaluation.

## 2022-09-02 NOTE — ED Triage Notes (Signed)
Pt reports falling today around 1100. She states the right ankle twisted (there is swelling/ pain to the area), and has soreness to the left shin.   Hoe interventions: none

## 2022-09-02 NOTE — ED Provider Notes (Signed)
[facility]    CSN: [id number] Arrival date & time: 09/02/22  1148      History   Chief Complaint Chief Complaint  Patient presents with   Fall   Ankle Pain    HPI Vanessa Dean is a 57 y.o. female.   Patient presents with right ankle pain, right foot pain, left shin pain that began after a fall around 11 AM.  Patient reports that she stepped off of a ledge and fell forward twisting her right ankle over.  Reports that her left shin landed on the metal portion of the ledge.  Patient not reporting any head trauma or loss of consciousness.  Has not taken any medications for pain.  Patient not reporting any numbness or tingling.   Fall  Ankle Pain   Past Medical History:  Diagnosis Date   Anemia    Anxiety    Arthritis    Back pain    Constipation    Crohn disease (HCC)    Edema, lower extremity    Fluid retention in legs    GERD (gastroesophageal reflux disease)    High cholesterol    Hypertension    Joint pain    Knee pain    Lactose intolerance    Osteoarthritis    PONV (postoperative nausea and vomiting)    Pre-diabetes    Seasonal allergies    TAKES ZYRTEC AS NEEDED   Sleep apnea    cpap at night   Snores    Vitamin D deficiency     Patient Active Problem List   Diagnosis Date Noted   At risk for dehydration 09/22/2021   Elevated serum creatinine 08/22/2021   Constipation 07/28/2021   Visceral obesity 06/08/2020   Hyperlipidemia associated with type 2 diabetes mellitus (HCC) 12/08/2019   Hypertension associated with type 2 diabetes mellitus (HCC) 12/08/2019   Other hyperlipidemia pure 11/24/2019   S/P gastric bypass 10/29/2019   Vitamin D deficiency 10/15/2019   Diabetes mellitus (HCC) 10/15/2019   Class 3 severe obesity with serious comorbidity and body mass index (BMI) of 45.0 to 49.9 in adult (HCC) 10/15/2019   S/P bilateral breast reduction 02/18/2019   Neck pain 10/08/2018   Back pain 10/08/2018   Symptomatic mammary  hypertrophy 10/08/2018   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 10/31/2017    Past Surgical History:  Procedure Laterality Date   ABDOMINAL HYSTERECTOMY     APPENDECTOMY     BREAST REDUCTION SURGERY Bilateral 02/12/2019   Procedure: MAMMARY REDUCTION  (BREAST);  Surgeon: Peggye Form, DO;  Location: [facility];  Service: Plastics;  Laterality: Bilateral;   CARPAL TUNNEL RELEASE  05/16/2011   Procedure: CARPAL TUNNEL RELEASE;  Surgeon: Wyn Forster., MD;  Location: [facility];  Service: Orthopedics;  Laterality: Left;   CARPAL TUNNEL RELEASE  08/08/2011   Procedure: CARPAL TUNNEL RELEASE;  Surgeon: Wyn Forster., MD;  Location: [facility];  Service: Orthopedics;  Laterality: Right;   CHOLECYSTECTOMY  1988   COLON SURGERY     patient had intestinal blockage   COLONOSCOPY     ESOPHAGOGASTRODUODENOSCOPY     GASTRIC BYPASS     IR ANGIO INTRA EXTRACRAN SEL COM CAROTID INNOMINATE BILAT MOD SED  05/09/2019   IR ANGIO VERTEBRAL SEL VERTEBRAL BILAT MOD SED  05/09/2019   IR US GUIDE VASC ACCESS RIGHT  05/09/2019   KNEE ARTHROSCOPY  05/09/2010   right  KNEE ARTHROSCOPY  05/10/2007   left   LUMBAR LAMINECTOMY/DECOMPRESSION MICRODISCECTOMY N/A 10/31/2017   Procedure: LUMBAR LAMINECTOMY/DECOMPRESSION MICRODISCECTOMY ONE LEVEL  LUMBAR FOUR - LUMBAR FIVE DECOMPRESSION;  Surgeon: Venita Lick, MD;  Location: [facility];  Service: Orthopedics;  Laterality: N/A;  LUMBAR LAMINECTOMY/DECOMPRESSION MICRODISCECTOMY ONE LEVEL  LUMBAR FOUR - LUMBAR FIVE DECOMPRESSION   TOTAL VAGINAL HYSTERECTOMY  07/20/2005   posterior colporrhaphy    OB History     Gravida  3   Para  3   Term      Preterm      AB      Living         SAB      IAB      Ectopic      Multiple      Live Births               Home Medications    Prior to Admission medications   Medication Sig Start Date End Date Taking? Authorizing Provider   traMADol (ULTRAM) 50 MG tablet Take 1 tablet (50 mg total) by mouth every 6 (six) hours as needed for severe pain. 09/02/22  Yes , Rolly Salter E, FNP  acetaminophen (TYLENOL) 500 MG tablet Take 500-1,000 mg by mouth every 6 (six) hours as needed for moderate pain or headache.    [provider]  atorvastatin (LIPITOR) 10 MG tablet Take 1 tablet (10 mg total) by mouth every evening. 09/22/21   Danford, Orpha Bur D, NP  diphenhydrAMINE (BENADRYL) 25 MG tablet Take 25 mg by mouth daily as needed for allergies.    [provider]  diphenhydramine-acetaminophen (TYLENOL PM) 25-500 MG TABS tablet Take 1-2 tablets by mouth at bedtime as needed (sleep).    [provider]  hydrochlorothiazide (HYDRODIURIL) 12.5 MG tablet Take as needed for edema. Max 1 tab per day. 09/22/21   Danford, Orpha Bur D, NP  Multiple Vitamins-Minerals (MULTIVITAMIN WITH MINERALS) tablet Take 1 tablet by mouth daily.    [provider]  ondansetron (ZOFRAN) 4 MG tablet Take 1 tablet (4 mg total) by mouth every 6 (six) hours. Patient taking differently: Take 4 mg by mouth as needed. 06/30/19   Ward, Layla Maw, DO  Semaglutide,0.25 or 0.5MG /DOS, 2 MG/3ML SOPN Inject 0.5 mg into the skin once a week. 08/02/22   Danford, Orpha Bur D, NP  valsartan (DIOVAN) 40 MG tablet Take 1 tablet (40 mg total) by mouth daily. 09/22/21   Danford, Jinny Blossom, NP    Family History Family History  Problem Relation Age of Onset   Diabetes Mother    Hypertension Mother    Hyperlipidemia Mother    Heart disease Mother    Kidney disease Mother    Depression Mother    Anxiety disorder Mother    Obesity Mother    Hypertension Father    Cancer Father    Alcohol abuse Paternal Uncle    Diabetes Paternal Uncle     Social History Social History   Tobacco Use   Smoking status: Never   Smokeless tobacco: Never  Vaping Use   Vaping Use: Never used  Substance Use Topics   Alcohol use: Yes    Comment: SOCIAL   Drug use: No      Allergies   Oxycodone-acetaminophen and Celecoxib   Review of Systems Review of Systems Per HPI  Physical Exam Triage Vital Signs ED Triage Vitals  Enc Vitals Group     BP 09/02/22 1303 (!) 145/98     Pulse  Rate 09/02/22 1303 79     Resp 09/02/22 1303 18     Temp 09/02/22 1303 97.6 F (36.4 C)     Temp Source 09/02/22 1303 Oral     SpO2 09/02/22 1303 98 %     Weight --      Height --      Head Circumference --      Peak Flow --      Pain Score 09/02/22 1302 8     Pain Loc --      Pain Edu? --      Excl. in GC? --    No data found.  Updated Vital Signs BP (!) 145/98 (BP Location: Left Arm)   Pulse 79   Temp 97.6 F (36.4 C) (Oral)   Resp 18   SpO2 98%   Visual Acuity Right Eye Distance:   Left Eye Distance:   Bilateral Distance:    Right Eye Near:   Left Eye Near:    Bilateral Near:     Physical Exam Constitutional:      General: She is not in acute distress.    Appearance: Normal appearance. She is not toxic-appearing or diaphoretic.  HENT:     Head: Normocephalic and atraumatic.  Eyes:     Extraocular Movements: Extraocular movements intact.     Conjunctiva/sclera: Conjunctivae normal.  Pulmonary:     Effort: Pulmonary effort is normal.  Musculoskeletal:     Comments: Patient has swelling and tenderness to palpation throughout the right lateral malleolus of the right ankle that extends into the dorsal surface of the foot and lateral portion of the foot.  No abrasions or lacerations noted.  No discoloration or warmth.  Patient has full range of motion of ankle and can wiggle toes.  Appears neurovascularly intact.  Tenderness to palpation throughout left shin.  There is a mild area of erythema present to left mid shin.  No abrasion or laceration noted.  Appears neurovascularly intact.  No tenderness to palpation to the left foot or ankle.  Neurological:     General: No focal deficit present.     Mental Status: She is alert and oriented to person,  place, and time. Mental status is at baseline.  Psychiatric:        Mood and Affect: Mood normal.        Behavior: Behavior normal.        Thought Content: Thought content normal.        Judgment: Judgment normal.      UC Treatments / Results  Labs (all labs ordered are listed, but only abnormal results are displayed) Labs Reviewed - No data to display  EKG   Radiology DG Tibia/Fibula Left  Result Date: 09/02/2022 CLINICAL DATA:  Larey Seat today around 11 a.m.  Left shin soreness. EXAM: LEFT TIBIA AND FIBULA - 2 VIEW COMPARISON:  None Available. FINDINGS: There is mildly decreased bone mineralization. There is mild longitudinal linear lucency overlying the proximal diaphysis of the tibia on frontal view only. No abnormal linear lucency is seen in the same region on the lateral view. Additionally, this linear lucency measures up to 1.5 mm in transverse thickness which is thicker than would be expected for an acute fracture line. This is favored to represent either an overlying soft tissue marking (such as fat surrounding the muscle fascial planes), and there are similar lucent lines within overlying calf musculature. An acute fracture is felt unlikely. No focal soft tissue swelling is seen in this  region. Moderate medial knee joint space narrowing and peripheral osteophytosis. Moderate plantar and posterior calcaneal heel spurs. IMPRESSION: No definite acute fracture is seen. Please see above discussion. Recommend clinical correlation for point tenderness to the proximal tibial diaphysis. Electronically Signed   By: Neita Garnet M.D.   On: 09/02/2022 14:33   DG Ankle Complete Right  Result Date: 09/02/2022 CLINICAL DATA:  Fall today. Twisted right ankle. Swelling and pain. EXAM: RIGHT ANKLE - COMPLETE 3+ VIEW; RIGHT FOOT COMPLETE - 3+ VIEW COMPARISON:  None Available. FINDINGS: Right ankle: Mild-to-moderate distal medial and lateral malleolar degenerative osteophytosis. Small plantar and  posterior calcaneal heel spurs. The ankle mortise is symmetric and intact. Moderate likely systemic soft tissue swelling. Right foot: There is transverse to mildly obliqued linear lucency within the base of the fifth metatarsal, an acute nondisplaced fracture. This comes close to the far medial proximal articular surface but does not definitely contact the articular surface. Mild-to-moderate second through fifth interphalangeal joint space narrowing. Minimal medial great toe metatarsal head degenerative spurring. Mild dorsal tarsometatarsal and minimal dorsal naviculocuneiform degenerative osteophytes on lateral view. No acute fracture is seen. No dislocation. IMPRESSION: 1. Acute nondisplaced fracture of the base of the fifth metatarsal. 2. Mild-to-moderate distal medial and lateral malleolar degenerative osteophytosis. 3. Mild midfoot and forefoot osteoarthritis. Electronically Signed   By: Neita Garnet M.D.   On: 09/02/2022 13:52   DG Foot Complete Right  Result Date: 09/02/2022 CLINICAL DATA:  Fall today. Twisted right ankle. Swelling and pain. EXAM: RIGHT ANKLE - COMPLETE 3+ VIEW; RIGHT FOOT COMPLETE - 3+ VIEW COMPARISON:  None Available. FINDINGS: Right ankle: Mild-to-moderate distal medial and lateral malleolar degenerative osteophytosis. Small plantar and posterior calcaneal heel spurs. The ankle mortise is symmetric and intact. Moderate likely systemic soft tissue swelling. Right foot: There is transverse to mildly obliqued linear lucency within the base of the fifth metatarsal, an acute nondisplaced fracture. This comes close to the far medial proximal articular surface but does not definitely contact the articular surface. Mild-to-moderate second through fifth interphalangeal joint space narrowing. Minimal medial great toe metatarsal head degenerative spurring. Mild dorsal tarsometatarsal and minimal dorsal naviculocuneiform degenerative osteophytes on lateral view. No acute fracture is seen. No  dislocation. IMPRESSION: 1. Acute nondisplaced fracture of the base of the fifth metatarsal. 2. Mild-to-moderate distal medial and lateral malleolar degenerative osteophytosis. 3. Mild midfoot and forefoot osteoarthritis. Electronically Signed   By: Neita Garnet M.D.   On: 09/02/2022 13:52    Procedures Procedures (including critical care time)  Medications Ordered in UC Medications - No data to display  Initial Impression / Assessment and Plan / UC Course  I have reviewed the triage vital signs and the nursing notes.  Pertinent labs & imaging results that were available during my care of the patient were reviewed by me and considered in my medical decision making (see chart for details).     X-ray showing nondisplaced fracture of the right fifth metatarsal.  Patient had a linear lucency on the x-ray of the left shin which is concerning.  Called Dr. Blanchie Dessert, with ortho to discuss this finding.  He advised that it appears to be possibly a nutrient artery but no concern for fracture.  Will place patient in a cam boot of the right foot for foot fracture and the patient was supplied with crutches.  Advised elevation, ice application.  Patient requesting pain medications so tramadol was prescribed for patient.  PDMP reviewed. Advised patient to use this sparingly as it  can make her drowsy.  Advised to not drive or drink alcohol with taking it.  Advised following up with orthopedist at provided contact information on Monday to schedule appointment for further evaluation and management.  Patient verbalized understanding and was agreeable with plan. Final Clinical Impressions(s) / UC Diagnoses   Final diagnoses:  Fall, initial encounter  Nondisplaced fracture of fifth metatarsal bone, right foot, initial encounter for closed fracture  Pain in left shin     Discharge Instructions      You have broken your foot.  Boot was applied.  Crutches were supplied as well.  Advised elevation and ice  application.  Follow-up with orthopedist on Monday to schedule appointment for further evaluation.     ED Prescriptions     Medication Sig Dispense Auth. Provider   traMADol (ULTRAM) 50 MG tablet Take 1 tablet (50 mg total) by mouth every 6 (six) hours as needed for severe pain. 10 tablet [place], Acie Fredrickson, [place]      I have reviewed the PDMP during this encounter.   Gustavus Bryant, [place] 09/02/22 [phone]

## 2022-09-14 ENCOUNTER — Encounter (INDEPENDENT_AMBULATORY_CARE_PROVIDER_SITE_OTHER): Payer: Self-pay | Admitting: Adult Health

## 2022-09-14 ENCOUNTER — Ambulatory Visit (INDEPENDENT_AMBULATORY_CARE_PROVIDER_SITE_OTHER): Payer: BC Managed Care – PPO | Admitting: Adult Health

## 2022-09-14 VITALS — BP 139/89 | HR 73 | Temp 97.5°F | Ht 65.0 in | Wt 270.0 lb

## 2022-09-14 DIAGNOSIS — Z7985 Long-term (current) use of injectable non-insulin antidiabetic drugs: Secondary | ICD-10-CM

## 2022-09-14 DIAGNOSIS — E1169 Type 2 diabetes mellitus with other specified complication: Secondary | ICD-10-CM | POA: Diagnosis not present

## 2022-09-14 DIAGNOSIS — E669 Obesity, unspecified: Secondary | ICD-10-CM

## 2022-09-14 DIAGNOSIS — Z9181 History of falling: Secondary | ICD-10-CM

## 2022-09-14 DIAGNOSIS — Z6841 Body Mass Index (BMI) 40.0 and over, adult: Secondary | ICD-10-CM | POA: Diagnosis not present

## 2022-09-14 MED ORDER — SEMAGLUTIDE (1 MG/DOSE) 4 MG/3ML ~~LOC~~ SOPN: 1.0000 mg | PEN_INJECTOR | SUBCUTANEOUS | 0 refills | Status: DC

## 2022-09-14 NOTE — Progress Notes (Signed)
WEIGHT SUMMARY AND BIOMETRICS  Vitals Temp: (!) 97.5 F (36.4 C) BP: 139/89 Pulse Rate: 73 SpO2: 99 %   Anthropometric Measurements Height: 5\' 5"  (1.651 m) Weight: 270 lb (122.5 kg) BMI (Calculated): 44.93 Weight at Last Visit: 270lbs Weight Lost Since Last Visit: 0 Weight Gained Since Last Visit: 0 Starting Weight: 274lbs Total Weight Loss (lbs): 4 lb (1.814 kg)   Body Composition  Body Fat %: 50.6 % Fat Mass (lbs): 136.8 lbs Muscle Mass (lbs): 126.6 lbs Total Body Water (lbs): 106.2 lbs Visceral Fat Rating : 17   Other Clinical Data Fasting: Yes Labs: Yes Today's Visit #: 75 Starting Date: 09/09/19    Chief Complaint:   OBESITY Vanessa Dean is here to discuss her progress with her obesity treatment plan. She is on the the Category 1 Plan +100 and states she is following her eating plan approximately 40 % of the time. She states she is not currently exercising due to recent R    Interim History:  Vanessa Dean suffered a fall on 09/02/22- nondisplaced fx of 5th metatarsal. Reviewed UC notes, and imaging.  Same night her children hosted a surprise 30th anniversary party- attended by >70 family/friends.  Subjective:   1. History of fall 09/02/2022 UC OV Notes: Patient presents with right ankle pain, right foot pain, left shin pain that began after a fall around 11 AM.  Patient reports that she stepped off of a ledge and fell forward twisting her right ankle over.  Reports that her left shin landed on the metal portion of the ledge.  Patient not reporting any head trauma or loss of consciousness.  Has not taken any medications for pain.  Patient not reporting any numbness or tingling. Nondisplaced fracture of fifth metatarsal bone, right foot, initial encounter for closed fracture  Pain in left shin     2. Type 2 diabetes mellitus with other specified complication, without long-term current use of insulin (HCC) Lab Results  Component Value Date   HGBA1C 5.7 (H)  12/06/2021   HGBA1C 5.6 07/19/2021   HGBA1C 5.7 03/04/2021   She has been on Ozempic 0.5mg  since early May 2024. Denies mass in neck, dysphagia, dyspepsia, persistent hoarseness, abdominal pain, or N/V/C  She reports increase in late afternoon polyphagia   Assessment/Plan:   1. History of fall Continue with boot, f/u with Ortho as directed  2. Type 2 diabetes mellitus with other specified complication, without long-term current use of insulin (HCC) Refill and increase Semaglutide, 1 MG/DOSE, 4 MG/3ML SOPN Inject 1 mg as directed once a week. Dispense: 9 mL, Refills: 0 ordered    3. Obesity, current BMI 44.76 Vanessa Dean is currently in the action stage of change. As such, her goal is to continue with weight loss efforts. She has agreed to the Category 1 Plan. +100  Exercise goals: No exercise has been prescribed at this time.  Behavioral modification strategies: increasing lean protein intake, decreasing simple carbohydrates, increasing vegetables, increasing water intake, meal planning and cooking strategies, and planning for success.  Marda has agreed to follow-up with our clinic in 4 weeks. She was informed of the importance of frequent follow-up visits to maximize her success with intensive lifestyle modifications for her multiple health conditions.   Objective:   Blood pressure 139/89, pulse 73, temperature (!) 97.5 F (36.4 C), height 5\' 5"  (1.651 m), weight 270 lb (122.5 kg), SpO2 99%. Body mass index is 44.93 kg/m.  General: Cooperative, alert, well developed, in no acute distress.  HEENT: Conjunctivae and lids unremarkable. Cardiovascular: Regular rhythm.  Lungs: Normal work of breathing. Neurologic: No focal deficits.   Lab Results  Component Value Date   CREATININE 0.98 12/06/2021   BUN 9 12/06/2021   NA 142 12/06/2021   K 3.8 12/06/2021   CL 102 12/06/2021   CO2 27 12/06/2021   Lab Results  Component Value Date   ALT 18 12/06/2021   AST 26 12/06/2021    ALKPHOS 130 (H) 12/06/2021   BILITOT 0.5 12/06/2021   Lab Results  Component Value Date   HGBA1C 5.7 (H) 12/06/2021   HGBA1C 5.6 07/19/2021   HGBA1C 5.7 03/04/2021   HGBA1C 5.8 (H) 10/28/2020   HGBA1C 5.8 (H) 03/17/2020   Lab Results  Component Value Date   INSULIN 7.7 12/06/2021   INSULIN 16.4 07/19/2021   INSULIN 12.3 03/17/2020   INSULIN 15.4 09/09/2019   Lab Results  Component Value Date   TSH 0.73 03/04/2021   Lab Results  Component Value Date   CHOL 135 12/06/2021   HDL 62 12/06/2021   LDLCALC 58 12/06/2021   TRIG 73 12/06/2021   CHOLHDL 2.2 12/06/2021   Lab Results  Component Value Date   VD25OH 57.6 12/06/2021   VD25OH 42.9 07/19/2021   VD25OH 46.6 03/04/2021   Lab Results  Component Value Date   WBC 7.4 03/04/2021   HGB 13.3 03/04/2021   HCT 39 03/04/2021   MCV 91 09/09/2019   PLT 275 03/04/2021   No results found for: "IRON", "TIBC", "FERRITIN"  Attestation Statements:   Reviewed by clinician on day of visit: allergies, medications, problem list, medical history, surgical history, family history, social history, and previous encounter notes.  I have reviewed the above documentation for accuracy and completeness, and I agree with the above. -  Kahlani Graber d. Rajean Desantiago, NP-C

## 2022-09-27 ENCOUNTER — Other Ambulatory Visit (INDEPENDENT_AMBULATORY_CARE_PROVIDER_SITE_OTHER): Payer: Self-pay | Admitting: Adult Health

## 2022-09-27 DIAGNOSIS — I10 Essential (primary) hypertension: Secondary | ICD-10-CM

## 2022-10-17 ENCOUNTER — Ambulatory Visit: Payer: BC Managed Care – PPO | Admitting: Plastic Surgery

## 2022-10-17 ENCOUNTER — Encounter: Payer: Self-pay | Admitting: Plastic Surgery

## 2022-10-17 DIAGNOSIS — N62 Hypertrophy of breast: Secondary | ICD-10-CM | POA: Diagnosis not present

## 2022-10-17 NOTE — Progress Notes (Signed)
   Subjective:    Patient ID: Vanessa Dean, female    DOB: 1965/03/13, 57 y.o.   MRN: [id number]  The patient is a 57 year old female here for evaluation of her breasts.  She underwent breast reduction in December 2020.  She had over 1200 g removed from both breasts.  Overall she has done really well.  She is very pleased with her results.  We reviewed the pre and postop pictures and it is really startling.  She has had a little bit of puckering of the left areola.  We tried to release of the scar with some grafting.  It got a little softer and a little bit better but there is still crease there.     Review of Systems  Constitutional: Negative.   Eyes: Negative.   Respiratory: Negative.    Cardiovascular: Negative.   Gastrointestinal: Negative.   Endocrine: Negative.   Genitourinary: Negative.   Musculoskeletal: Negative.        Objective:   Physical Exam Constitutional:      Appearance: Normal appearance.  Cardiovascular:     Rate and Rhythm: Normal rate.     Pulses: Normal pulses.  Pulmonary:     Effort: Pulmonary effort is normal.  Skin:    General: Skin is warm.     Capillary Refill: Capillary refill takes less than 2 seconds.     Coloration: Skin is not jaundiced.     Findings: No bruising or lesion.  Neurological:     Mental Status: She is alert and oriented to person, place, and time.  Psychiatric:        Mood and Affect: Mood normal.        Behavior: Behavior normal.        Thought Content: Thought content normal.        Judgment: Judgment normal.         Assessment & Plan:     ICD-10-CM   1. Symptomatic mammary hypertrophy  N62        Patient is aware that fat grafting is an option.  She is okay with it right now and wants to leave it alone.  Happy to see her back in a year for reevaluation.  She could continue with a healthy weight and wellness this might help with her overall body habitus as well.  It was great to see her today.  Pictures were  obtained of the patient and placed in the chart with the patient's or guardian's permission.

## 2022-10-19 ENCOUNTER — Encounter: Payer: Self-pay | Admitting: Plastic Surgery

## 2022-10-19 ENCOUNTER — Ambulatory Visit (INDEPENDENT_AMBULATORY_CARE_PROVIDER_SITE_OTHER): Payer: BC Managed Care – PPO | Admitting: Adult Health

## 2022-10-19 ENCOUNTER — Telehealth (INDEPENDENT_AMBULATORY_CARE_PROVIDER_SITE_OTHER): Payer: Self-pay | Admitting: Adult Health

## 2022-10-19 ENCOUNTER — Encounter (INDEPENDENT_AMBULATORY_CARE_PROVIDER_SITE_OTHER): Payer: Self-pay | Admitting: Adult Health

## 2022-10-19 VITALS — BP 122/76 | HR 76 | Temp 97.6°F | Ht 65.0 in | Wt 268.0 lb

## 2022-10-19 DIAGNOSIS — Z6841 Body Mass Index (BMI) 40.0 and over, adult: Secondary | ICD-10-CM

## 2022-10-19 DIAGNOSIS — Z7985 Long-term (current) use of injectable non-insulin antidiabetic drugs: Secondary | ICD-10-CM

## 2022-10-19 DIAGNOSIS — E785 Hyperlipidemia, unspecified: Secondary | ICD-10-CM | POA: Diagnosis not present

## 2022-10-19 DIAGNOSIS — E669 Obesity, unspecified: Secondary | ICD-10-CM

## 2022-10-19 DIAGNOSIS — E1169 Type 2 diabetes mellitus with other specified complication: Secondary | ICD-10-CM

## 2022-10-19 MED ORDER — TIRZEPATIDE 5 MG/0.5ML ~~LOC~~ SOAJ: 5.0000 mg | SUBCUTANEOUS | 0 refills | Status: DC

## 2022-10-19 NOTE — Telephone Encounter (Signed)
Prior authorization done via cover my meds for patients medication Mounjaro.It was approved. Patient has been notified.  As long as you remain covered by the [facility] and there are no changes to your plan benefits, this request is approved for the following time period: 10/19/2022 - 10/18/2025

## 2022-10-19 NOTE — Progress Notes (Signed)
WEIGHT SUMMARY AND BIOMETRICS  Vitals Temp: 97.6 F (36.4 C) BP: 122/76 Pulse Rate: 76 SpO2: 98 %   Anthropometric Measurements Height: 5\' 5"  (1.651 m) Weight: 268 lb (121.6 kg) BMI (Calculated): 44.6 Weight at Last Visit: 270lb Weight Lost Since Last Visit: 2lb Weight Gained Since Last Visit: 0lb Starting Weight: 274lb Total Weight Loss (lbs): 6 lb (2.722 kg)   Body Composition  Body Fat %: 50 % Fat Mass (lbs): 134.4 lbs Muscle Mass (lbs): 127.4 lbs Total Body Water (lbs): 104.2 lbs Visceral Fat Rating : 17   Other Clinical Data Fasting: Yes Labs: No Today's Visit #: 35 Starting Date: 09/09/19    Chief Complaint:   OBESITY Vanessa Dean is here to discuss her progress with her obesity treatment plan. She is on the the Category 1 Plan +100 and states she is following her eating plan approximately 40 % of the time. She states she is exercising NEAT.   Interim History:  She is out of boot for Nondisplaced fracture of fifth metatarsal bone, right foot, initial encounter for closed fracture. She reports continued dull aching foot pain, however able to maintain ADLs, drive, and wear matching shoes.  Discussed risks/benefits of changing from Ozempic to [facility] therapy, referenced SURPASS study  Subjective:   1. Type 2 diabetes mellitus with other specified complication, without long-term current use of insulin [facility]) She was off Ozempic therapy Oct 2023 until Feb 2024 due to [employer]. Restarted and has titrated up to 1mg  once weekly injection- has had 3 doses at this strength Denies mass in neck, dysphagia, dyspepsia, persistent hoarseness, abdominal pain, or N/V/C   2. Hyperlipidemia associated with type 2 diabetes mellitus (HCC) Lipid Panel     Component Value Date/Time   CHOL 135 12/06/2021 0804   TRIG 73 12/06/2021 0804   HDL 62 12/06/2021 0804   CHOLHDL 2.2 12/06/2021 0804   LDLCALC 58 12/06/2021 0804   LABVLDL 15 12/06/2021 0804   She is  currently on daily Lipitor 10mg  - denies myalgias  She denies tobacco/vape use She denies CP with exertion  Assessment/Plan:   1. Type 2 diabetes mellitus with other specified complication, without long-term current use of insulin (HCC) Stop Ozempic Start tirzepatide [facility]) 5 MG/0.5ML Pen Inject 5 mg into the skin once a week. Dispense: 6 mL, Refills: 0 ordered   2. Hyperlipidemia associated with type 2 diabetes mellitus (HCC) Continue statin therapy and increase regular cardiovasular exercise  3. Obesity, current BMI 44.7  Vanessa Dean is currently in the action stage of change. As such, her goal is to continue with weight loss efforts. She has agreed to the Category 1 Plan. +100  Exercise goals: All adults should avoid inactivity. Some physical activity is better than none, and adults who participate in any amount of physical activity gain some health benefits. Adults should also include muscle-strengthening activities that involve all major muscle groups on 2 or more days a week.  Behavioral modification strategies: increasing lean protein intake, decreasing simple carbohydrates, increasing vegetables, increasing water intake, decreasing liquid calories, better snacking choices, and planning for success.  Vanessa Dean has agreed to follow-up with our clinic in 4 weeks. She was informed of the importance of frequent follow-up visits to maximize her success with intensive lifestyle modifications for her multiple health conditions.   Check Fasting Labs at next OV  Objective:   Blood pressure 122/76, pulse 76, temperature 97.6 F (36.4 C), height 5\' 5"  (1.651 m), weight 268 lb (121.6 kg), SpO2  98%. Body mass index is 44.6 kg/m.  General: Cooperative, alert, well developed, in no acute distress. HEENT: Conjunctivae and lids unremarkable. Cardiovascular: Regular rhythm.  Lungs: Normal work of breathing. Neurologic: No focal deficits.   Lab Results  Component Value Date   CREATININE 0.98  12/06/2021   BUN 9 12/06/2021   NA 142 12/06/2021   K 3.8 12/06/2021   CL 102 12/06/2021   CO2 27 12/06/2021   Lab Results  Component Value Date   ALT 18 12/06/2021   AST 26 12/06/2021   ALKPHOS 130 (H) 12/06/2021   BILITOT 0.5 12/06/2021   Lab Results  Component Value Date   HGBA1C 5.7 (H) 12/06/2021   HGBA1C 5.6 07/19/2021   HGBA1C 5.7 03/04/2021   HGBA1C 5.8 (H) 10/28/2020   HGBA1C 5.8 (H) 03/17/2020   Lab Results  Component Value Date   INSULIN 7.7 12/06/2021   INSULIN 16.4 07/19/2021   INSULIN 12.3 03/17/2020   INSULIN 15.4 09/09/2019   Lab Results  Component Value Date   TSH 0.73 03/04/2021   Lab Results  Component Value Date   CHOL 135 12/06/2021   HDL 62 12/06/2021   LDLCALC 58 12/06/2021   TRIG 73 12/06/2021   CHOLHDL 2.2 12/06/2021   Lab Results  Component Value Date   VD25OH 57.6 12/06/2021   VD25OH 42.9 07/19/2021   VD25OH 46.6 03/04/2021   Lab Results  Component Value Date   WBC 7.4 03/04/2021   HGB 13.3 03/04/2021   HCT 39 03/04/2021   MCV 91 09/09/2019   PLT 275 03/04/2021   No results found for: "IRON", "TIBC", "FERRITIN"  Attestation Statements:   Reviewed by clinician on day of visit: allergies, medications, problem list, medical history, surgical history, family history, social history, and previous encounter notes.  I have reviewed the above documentation for accuracy and completeness, and I agree with the above. -  Nakiesha Rumsey d. Dantord, NP-C

## 2022-10-23 ENCOUNTER — Other Ambulatory Visit (INDEPENDENT_AMBULATORY_CARE_PROVIDER_SITE_OTHER): Payer: Self-pay | Admitting: Plastic Surgery

## 2022-10-23 DIAGNOSIS — N62 Hypertrophy of breast: Secondary | ICD-10-CM

## 2022-10-23 NOTE — Telephone Encounter (Signed)
Vanessa Dean decided she would like to submit for insurance after her recent appt with Dr. Ulice Bold

## 2022-11-21 ENCOUNTER — Encounter (INDEPENDENT_AMBULATORY_CARE_PROVIDER_SITE_OTHER): Payer: Self-pay | Admitting: Adult Health

## 2022-11-21 ENCOUNTER — Ambulatory Visit (INDEPENDENT_AMBULATORY_CARE_PROVIDER_SITE_OTHER): Payer: BC Managed Care – PPO | Admitting: Adult Health

## 2022-11-21 VITALS — BP 114/76 | HR 76 | Temp 97.9°F | Ht 65.0 in | Wt 266.0 lb

## 2022-11-21 DIAGNOSIS — E1169 Type 2 diabetes mellitus with other specified complication: Secondary | ICD-10-CM

## 2022-11-21 DIAGNOSIS — E559 Vitamin D deficiency, unspecified: Secondary | ICD-10-CM

## 2022-11-21 DIAGNOSIS — E785 Hyperlipidemia, unspecified: Secondary | ICD-10-CM | POA: Diagnosis not present

## 2022-11-21 DIAGNOSIS — Z7985 Long-term (current) use of injectable non-insulin antidiabetic drugs: Secondary | ICD-10-CM

## 2022-11-21 DIAGNOSIS — E669 Obesity, unspecified: Secondary | ICD-10-CM

## 2022-11-21 DIAGNOSIS — Z6841 Body Mass Index (BMI) 40.0 and over, adult: Secondary | ICD-10-CM

## 2022-11-21 NOTE — Progress Notes (Signed)
WEIGHT SUMMARY AND BIOMETRICS  Vitals Temp: 97.9 F (36.6 C) BP: 114/76 Pulse Rate: 76 SpO2: 98 %   Anthropometric Measurements Height: 5\' 5"  (1.651 m) Weight: 266 lb (120.7 kg) BMI (Calculated): 44.26 Weight at Last Visit: 268lb Weight Lost Since Last Visit: 2 Weight Gained Since Last Visit: 0 Starting Weight: 274lb Total Weight Loss (lbs): 8 lb (3.629 kg)   Body Composition  Body Fat %: 50 % Fat Mass (lbs): 133.4 lbs Muscle Mass (lbs): 126.6 lbs Total Body Water (lbs): 104.6 lbs Visceral Fat Rating : 17   Other Clinical Data Fasting: yes Labs: yes Today's Visit #: 12 Starting Date: 09/09/19    Chief Complaint:   OBESITY Vanessa Dean is here to discuss her progress with her obesity treatment plan. She is on the the Category 1 Plan+100 and states she is following her eating plan approximately 50 % of the time. She states she is exercising LifePro Machine 15 minutes 7 times per week.   Interim History:  10/19/2022 OV at [facility]- Ozempic 1mg  replaced with Mounjaro 5mg  She has had 4 doses of new therapy- Denies mass in neck, dysphagia, dyspepsia, persistent hoarseness, abdominal pain, or N/V/C   She recently purchased LifePro Vibration Plate- has been using 15 mins  daily for the last week. She reports next day soreness after using the Plate.  Subjective:   1. Type 2 diabetes mellitus with other specified complication, without long-term current use of insulin (HCC) Lab Results  Component Value Date   HGBA1C 5.7 (H) 12/06/2021   HGBA1C 5.6 07/19/2021   HGBA1C 5.7 03/04/2021    Home fasting CBG 91-94 She denies sx's of hypoglycemia  10/19/2022 OV at [facility]- Ozempic 1mg  replaced with Mounjaro 5mg  She has had 4 doses of new therapy- Denies mass in neck, dysphagia, dyspepsia, persistent hoarseness, abdominal pain, or N/V/C   2. Vitamin D deficiency  Latest Reference Range & Units 10/28/20 08:20 03/04/21 00:00 07/19/21 08:39 12/06/21 08:04  Vitamin D, 25-Hydroxy  30.0 - 100.0 ng/mL 47.9 46.6 (E) 42.9 57.6  (E): External lab result  She is on daily OTC Multivitamin  She endorses stable energy levels  3. Hyperlipidemia associated with type 2 diabetes mellitus (HCC) Lipid Panel     Component Value Date/Time   CHOL 135 12/06/2021 0804   TRIG 73 12/06/2021 0804   HDL 62 12/06/2021 0804   CHOLHDL 2.2 12/06/2021 0804   LDLCALC 58 12/06/2021 0804   LABVLDL 15 12/06/2021 0804    She is on daily Lipitor 10mg - denies myalgias  Assessment/Plan:   1. Type 2 diabetes mellitus with other specified complication, without long-term current use of insulin (HCC) Check Labs - Hemoglobin A1c - Insulin, random - Vitamin B12 - Comprehensive metabolic panel  2. Vitamin D deficiency Check Labs - VITAMIN D 25 Hydroxy (Vit-D Deficiency, Fractures)  3. Hyperlipidemia associated with type 2 diabetes mellitus (HCC) Check Labs - Lipid panel  4. Obesity, current BMI 44.26  Vanessa Dean is currently in the action stage of change. As such, her goal is to maintain weight for now. She has agreed to the Category 1 Plan. +100  Exercise goals: For substantial health benefits, adults should do at least 150 minutes (2 hours and 30 minutes) a week of moderate-intensity, or 75 minutes (1 hour and 15 minutes) a week of vigorous-intensity aerobic physical activity, or an equivalent combination of moderate- and vigorous-intensity aerobic activity. Aerobic activity should be performed in episodes of at least 10 minutes, and preferably, it should be  spread throughout the week.  Behavioral modification strategies: increasing lean protein intake, decreasing simple carbohydrates, increasing vegetables, increasing water intake, no skipping meals, meal planning and cooking strategies, and planning for success.  Vanessa Dean has agreed to follow-up with our clinic in 6 weeks. She was informed of the importance of frequent follow-up visits to maximize her success with intensive lifestyle  modifications for her multiple health conditions.   Vanessa Dean was informed we would discuss her lab results at her next visit unless there is a critical issue that needs to be addressed sooner. Vanessa Dean agreed to keep her next visit at the agreed upon time to discuss these results.  Objective:   Blood pressure 114/76, pulse 76, temperature 97.9 F (36.6 C), height 5\' 5"  (1.651 m), weight 266 lb (120.7 kg), SpO2 98%. Body mass index is 44.26 kg/m.  General: Cooperative, alert, well developed, in no acute distress. HEENT: Conjunctivae and lids unremarkable. Cardiovascular: Regular rhythm.  Lungs: Normal work of breathing. Neurologic: No focal deficits.   Lab Results  Component Value Date   CREATININE 0.98 12/06/2021   BUN 9 12/06/2021   NA 142 12/06/2021   K 3.8 12/06/2021   CL 102 12/06/2021   CO2 27 12/06/2021   Lab Results  Component Value Date   ALT 18 12/06/2021   AST 26 12/06/2021   ALKPHOS 130 (H) 12/06/2021   BILITOT 0.5 12/06/2021   Lab Results  Component Value Date   HGBA1C 5.7 (H) 12/06/2021   HGBA1C 5.6 07/19/2021   HGBA1C 5.7 03/04/2021   HGBA1C 5.8 (H) 10/28/2020   HGBA1C 5.8 (H) 03/17/2020   Lab Results  Component Value Date   INSULIN 7.7 12/06/2021   INSULIN 16.4 07/19/2021   INSULIN 12.3 03/17/2020   INSULIN 15.4 09/09/2019   Lab Results  Component Value Date   TSH 0.73 03/04/2021   Lab Results  Component Value Date   CHOL 135 12/06/2021   HDL 62 12/06/2021   LDLCALC 58 12/06/2021   TRIG 73 12/06/2021   CHOLHDL 2.2 12/06/2021   Lab Results  Component Value Date   VD25OH 57.6 12/06/2021   VD25OH 42.9 07/19/2021   VD25OH 46.6 03/04/2021   Lab Results  Component Value Date   WBC 7.4 03/04/2021   HGB 13.3 03/04/2021   HCT 39 03/04/2021   MCV 91 09/09/2019   PLT 275 03/04/2021   No results found for: "IRON", "TIBC", "FERRITIN"  Attestation Statements:   Reviewed by clinician on day of visit: allergies, medications, problem list, medical  history, surgical history, family history, social history, and previous encounter notes.  I have reviewed the above documentation for accuracy and completeness, and I agree with the above. -  Hilmar Moldovan d. Senan Urey, NP-C

## 2022-11-23 ENCOUNTER — Encounter: Payer: Self-pay | Admitting: Plastic Surgery

## 2022-12-08 ENCOUNTER — Encounter: Payer: Self-pay | Admitting: Nurse Practitioner

## 2022-12-08 ENCOUNTER — Telehealth: Payer: BC Managed Care – PPO | Admitting: Nurse Practitioner

## 2022-12-08 DIAGNOSIS — M79673 Pain in unspecified foot: Secondary | ICD-10-CM

## 2022-12-08 NOTE — Progress Notes (Signed)
Requested to cancel - she has reached out to PCP for referral for cortisone injection

## 2023-01-02 ENCOUNTER — Encounter (INDEPENDENT_AMBULATORY_CARE_PROVIDER_SITE_OTHER): Payer: Self-pay | Admitting: Adult Health

## 2023-01-02 ENCOUNTER — Ambulatory Visit (INDEPENDENT_AMBULATORY_CARE_PROVIDER_SITE_OTHER): Payer: BC Managed Care – PPO | Admitting: Adult Health

## 2023-01-02 VITALS — BP 140/82 | HR 72 | Temp 98.1°F | Ht 65.0 in | Wt 262.0 lb

## 2023-01-02 DIAGNOSIS — E669 Obesity, unspecified: Secondary | ICD-10-CM

## 2023-01-02 DIAGNOSIS — E1169 Type 2 diabetes mellitus with other specified complication: Secondary | ICD-10-CM

## 2023-01-02 DIAGNOSIS — Z6841 Body Mass Index (BMI) 40.0 and over, adult: Secondary | ICD-10-CM

## 2023-01-02 DIAGNOSIS — I152 Hypertension secondary to endocrine disorders: Secondary | ICD-10-CM | POA: Diagnosis not present

## 2023-01-02 DIAGNOSIS — E785 Hyperlipidemia, unspecified: Secondary | ICD-10-CM

## 2023-01-02 DIAGNOSIS — E559 Vitamin D deficiency, unspecified: Secondary | ICD-10-CM

## 2023-01-02 DIAGNOSIS — Z7985 Long-term (current) use of injectable non-insulin antidiabetic drugs: Secondary | ICD-10-CM

## 2023-01-02 DIAGNOSIS — E1159 Type 2 diabetes mellitus with other circulatory complications: Secondary | ICD-10-CM | POA: Diagnosis not present

## 2023-01-02 DIAGNOSIS — E66813 Obesity, class 3: Secondary | ICD-10-CM

## 2023-01-02 MED ORDER — TIRZEPATIDE 5 MG/0.5ML ~~LOC~~ SOAJ: 5.0000 mg | SUBCUTANEOUS | 0 refills | Status: DC

## 2023-01-02 NOTE — Progress Notes (Signed)
WEIGHT SUMMARY AND BIOMETRICS  Vitals Temp: 98.1 F (36.7 C) BP: (!) 140/82 Pulse Rate: 72 SpO2: 98 %   Anthropometric Measurements Height: 5\' 5"  (1.651 m) Weight: 262 lb (118.8 kg) BMI (Calculated): 43.6 Weight at Last Visit: 266lb Weight Lost Since Last Visit: 4lb Weight Gained Since Last Visit: 0 Starting Weight: 274lb Total Weight Loss (lbs): 12 lb (5.443 kg)   Body Composition  Body Fat %: 46.5 % Fat Mass (lbs): 122 lbs Muscle Mass (lbs): 133.2 lbs Total Body Water (lbs): 99 lbs Visceral Fat Rating : 15   Other Clinical Data Fasting: yes Labs: no Today's Visit #: 37 Starting Date: 09/09/19    Chief Complaint:   OBESITY Vanessa Dean is here to discuss her progress with her obesity treatment plan. She is on the the Category 1 Plan +10 and states she is following her eating plan approximately 50 % of the time. She states she is exercising Walking and nightly LifePro Vibration Machine 15 minutes 3-4 times per week.   Interim History:  Reviewed Bioimpedance results with pt: Muscle Mass: +6.6 lbs Adipose Mass: -11.4 lbs  She reports less GI upset (dyspepsia) with weekly Mounjaro vs weekly Ozempic  Hydration-she has been increasing daily water intake- GREAT!  Subjective:   1. Hypertension associated with type 2 diabetes mellitus (HCC) Discussed Labs BP stable 12/07/2022 CMP- Electrolytes stable, slight increase in serum creat 1.06 GFR 62 She is on daily Valsartan 40mg  PRN hydrochlorothiazide 12.5mg  - edema  2. Hyperlipidemia associated with type 2 diabetes mellitus [facility]) Discussed Labs Lipid Panel     Component Value Date/Time   CHOL 159 11/21/2022 0922   TRIG 95 11/21/2022 0922   HDL 59 11/21/2022 0922   CHOLHDL 2.7 11/21/2022 0922   LDLCALC 83 11/21/2022 0922   LABVLDL 17 11/21/2022 0922   The 10-year ASCVD risk score (Arnett DK, et al., 2019) is: 11.7%   Values used to calculate the score:     Age: 57 years     Sex: Female     Is  Non-Hispanic African American: Yes     Diabetic: Yes     Tobacco smoker: No     Systolic Blood Pressure: 140 mmHg     Is BP treated: Yes     HDL Cholesterol: 59 mg/dL     Total Cholesterol: 159 mg/dL   LDL slightly above goal for diabetic She is on daily Lipitor 10mg   3. Vitamin D deficiency Discussed Labs  Latest Reference Range & Units 12/06/21 08:04 11/21/22 09:22  Vitamin D, 25-Hydroxy 30.0 - 100.0 ng/mL 57.6 50.7   Level at goal She is on daily OTC Vit D 3 5,000 international units and daily OTC Multivitamin   4. Type 2 diabetes mellitus with other specified complication, without long-term current use of insulin (HCC) Discussed Labs Lab Results  Component Value Date   HGBA1C 5.9 (H) 11/21/2022   HGBA1C 5.7 (H) 12/06/2021   HGBA1C 5.6 07/19/2021    Fasting CBG with weekly Ozempic 103-110 Fasting CBG with Mounjaro 91-99 She denies sx's of hypoglycemia  She is on weekly Mounjaro 5mg  Denies mass in neck, dysphagia, dyspepsia, persistent hoarseness, abdominal pain, or N/V/C   She endorses less GI upset with Mounjaro vs Ozempic   Assessment/Plan:   1. Hypertension associated with type 2 diabetes mellitus (HCC) Continue daily Valsartan  2. Hyperlipidemia associated with type 2 diabetes mellitus (HCC) Continue daily Lipitor If LDL still above goal at next lipid panel, increase statin dose  3.  Vitamin D deficiency Continue daily OTC Vit D 3 5,000 international units and daily OTC Multivitamin   4. Type 2 diabetes mellitus with other specified complication, without long-term current use of insulin (HCC) Refill tirzepatide (MOUNJARO) 5 MG/0.5ML Pen Inject 5 mg into the skin once a week. Dispense: 6 mL, Refills: 0 ordered   5. Obesity, current BMI 43.6  Vanessa Dean is currently in the action stage of change. As such, her goal is to continue with weight loss efforts. She has agreed to the Category 1 Plan. +100  Exercise goals: For substantial health benefits, adults should  do at least 150 minutes (2 hours and 30 minutes) a week of moderate-intensity, or 75 minutes (1 hour and 15 minutes) a week of vigorous-intensity aerobic physical activity, or an equivalent combination of moderate- and vigorous-intensity aerobic activity. Aerobic activity should be performed in episodes of at least 10 minutes, and preferably, it should be spread throughout the week.  Behavioral modification strategies: increasing lean protein intake, decreasing simple carbohydrates, increasing vegetables, increasing water intake, decreasing eating out, no skipping meals, meal planning and cooking strategies, keeping healthy foods in the home, and planning for success.  Vanessa Dean has agreed to follow-up with our clinic in 4 weeks. She was informed of the importance of frequent follow-up visits to maximize her success with intensive lifestyle modifications for her multiple health conditions.  Objective:   Blood pressure (!) 140/82, pulse 72, temperature 98.1 F (36.7 C), height 5\' 5"  (1.651 m), weight 262 lb (118.8 kg), SpO2 98%. Body mass index is 43.6 kg/m.  General: Cooperative, alert, well developed, in no acute distress. HEENT: Conjunctivae and lids unremarkable. Cardiovascular: Regular rhythm.  Lungs: Normal work of breathing. Neurologic: No focal deficits.   Lab Results  Component Value Date   CREATININE 1.06 (H) 11/21/2022   BUN 10 11/21/2022   NA 145 (H) 11/21/2022   K 4.0 11/21/2022   CL 104 11/21/2022   CO2 25 11/21/2022   Lab Results  Component Value Date   ALT 17 11/21/2022   AST 22 11/21/2022   ALKPHOS 145 (H) 11/21/2022   BILITOT 0.7 11/21/2022   Lab Results  Component Value Date   HGBA1C 5.9 (H) 11/21/2022   HGBA1C 5.7 (H) 12/06/2021   HGBA1C 5.6 07/19/2021   HGBA1C 5.7 03/04/2021   HGBA1C 5.8 (H) 10/28/2020   Lab Results  Component Value Date   INSULIN 16.8 11/21/2022   INSULIN 7.7 12/06/2021   INSULIN 16.4 07/19/2021   INSULIN 12.3 03/17/2020   INSULIN  15.4 09/09/2019   Lab Results  Component Value Date   TSH 0.73 03/04/2021   Lab Results  Component Value Date   CHOL 159 11/21/2022   HDL 59 11/21/2022   LDLCALC 83 11/21/2022   TRIG 95 11/21/2022   CHOLHDL 2.7 11/21/2022   Lab Results  Component Value Date   VD25OH 50.7 11/21/2022   VD25OH 57.6 12/06/2021   VD25OH 42.9 07/19/2021   Lab Results  Component Value Date   WBC 7.4 03/04/2021   HGB 13.3 03/04/2021   HCT 39 03/04/2021   MCV 91 09/09/2019   PLT 275 03/04/2021   No results found for: "IRON", "TIBC", "FERRITIN"  Attestation Statements:   Reviewed by clinician on day of visit: allergies, medications, problem list, medical history, surgical history, family history, social history, and previous encounter notes.  I have reviewed the above documentation for accuracy and completeness, and I agree with the above. -  Cailah Reach d. Rahcel Shutes, NP-C

## 2023-02-13 ENCOUNTER — Encounter (INDEPENDENT_AMBULATORY_CARE_PROVIDER_SITE_OTHER): Payer: Self-pay | Admitting: Adult Health

## 2023-02-13 ENCOUNTER — Ambulatory Visit (INDEPENDENT_AMBULATORY_CARE_PROVIDER_SITE_OTHER): Payer: BC Managed Care – PPO | Admitting: Adult Health

## 2023-02-13 VITALS — BP 121/81 | HR 87 | Temp 97.7°F | Ht 65.0 in | Wt 259.0 lb

## 2023-02-13 DIAGNOSIS — Z7985 Long-term (current) use of injectable non-insulin antidiabetic drugs: Secondary | ICD-10-CM

## 2023-02-13 DIAGNOSIS — E1169 Type 2 diabetes mellitus with other specified complication: Secondary | ICD-10-CM

## 2023-02-13 DIAGNOSIS — E1159 Type 2 diabetes mellitus with other circulatory complications: Secondary | ICD-10-CM

## 2023-02-13 DIAGNOSIS — I152 Hypertension secondary to endocrine disorders: Secondary | ICD-10-CM

## 2023-02-13 DIAGNOSIS — E669 Obesity, unspecified: Secondary | ICD-10-CM | POA: Diagnosis not present

## 2023-02-13 DIAGNOSIS — Z6841 Body Mass Index (BMI) 40.0 and over, adult: Secondary | ICD-10-CM

## 2023-02-13 NOTE — Progress Notes (Signed)
WEIGHT SUMMARY AND BIOMETRICS  Vitals Temp: 97.7 F (36.5 C) BP: 121/81 Pulse Rate: 87 SpO2: 92 %   Anthropometric Measurements Height: 5\' 5"  (1.651 m) Weight: 259 lb (117.5 kg) BMI (Calculated): 43.1 Weight at Last Visit: 262lb Weight Lost Since Last Visit: 3lb Weight Gained Since Last Visit: 0 Starting Weight: 274lb Total Weight Loss (lbs): 15 lb (6.804 kg)   Body Composition  Body Fat %: 47.3 % Fat Mass (lbs): 122.8 lbs Muscle Mass (lbs): 130 lbs Total Body Water (lbs): 98.4 lbs Visceral Fat Rating : 16   Other Clinical Data Fasting: yes Labs: no Today's Visit #: 50 Starting Date: 09/09/19    Chief Complaint:   OBESITY Vanessa Dean is here to discuss her progress with her obesity treatment plan. She is on the the Category 1 Plan+100 and states she is following her eating plan approximately 40 % of the time. She states she is exercising Walking daily.   Interim History:  10/19/2022 OV at [facility]- Ozempic 1mg  replaced with Mounjaro 5mg    10/19/22  Muscle Mass (lbs) 127.4 lbs  Fat Mass (lbs) 134.4 lbs  Total Body Water (lbs) 104.2 lbs  Visceral Fat Rating  17    02/13/23  Muscle Mass (lbs) 130 lbs  Fat Mass (lbs) 122.8 lbs  Total Body Water (lbs) 98.4 lbs  Visceral Fat Rating  16   She has gained +2.6 lbs muscle mass and lost 11.6 lbs of adipose since replacing GLP-1 therapy with GIP/GLP-1 therapy  Subjective:   1. Type 2 diabetes mellitus with other specified complication, without long-term current use of insulin (HCC) 10/19/2022 OV at [facility]- Ozempic 1mg  replaced with [facility] 5mg  She has gained +2.6 lbs muscle mass and lost 11.6 lbs of adipose since replacing GLP-1 therapy with GIP/GLP-1 therapy Denies mass in neck, dysphagia, dyspepsia, persistent hoarseness, abdominal pain, or N/V/C  She reports significant reduction of abdominal bloating with Mounjaro versus Ozempic Fasting CBG high 90s She denies sx's of hypoglycemia  2. Hypertension associated  with type 2 diabetes mellitus (HCC) Repeat BP at goal at OV She denies CP with exertion. She is trying to walk more daily. She is currently on  hydrochlorothiazide (HYDRODIURIL) 12.5 MG tablet  atorvastatin (LIPITOR) 10 MG tablet  valsartan (DIOVAN) 40 MG tablet  tirzepatide (MOUNJARO) 5 MG/0.5ML Pen    Assessment/Plan:   1. Type 2 diabetes mellitus with other specified complication, without long-term current use of insulin (HCC) She would like to utilize the [facility] 5mg  supply at home, then consider increasing to 7.5mg  when a refill is needed. She estimates to have 2 month supply  2. Hypertension associated with type 2 diabetes mellitus (HCC) Continue hydrochlorothiazide (HYDRODIURIL) 12.5 MG tablet  atorvastatin (LIPITOR) 10 MG tablet  valsartan (DIOVAN) 40 MG tablet  tirzepatide (MOUNJARO) 5 MG/0.5ML Pen   3. Obesity, current BMI 43.1  Vanessa Dean is currently in the action stage of change. As such, her goal is to continue with weight loss efforts. She has agreed to the Category 1 Plan. +100  Exercise goals: For substantial health benefits, adults should do at least 150 minutes (2 hours and 30 minutes) a week of moderate-intensity, or 75 minutes (1 hour and 15 minutes) a week of vigorous-intensity aerobic physical activity, or an equivalent combination of moderate- and vigorous-intensity aerobic activity. Aerobic activity should be performed in episodes of at least 10 minutes, and preferably, it should be spread throughout the week.  Behavioral modification strategies: increasing lean protein intake, decreasing simple carbohydrates, increasing vegetables,  increasing water intake, no skipping meals, meal planning and cooking strategies, keeping healthy foods in the home, and planning for success.  Vanessa Dean has agreed to follow-up with our clinic in 4 weeks. She was informed of the importance of frequent follow-up visits to maximize her success with intensive lifestyle modifications for her  multiple health conditions.   Objective:   Blood pressure 121/81, pulse 87, temperature 97.7 F (36.5 C), height 5\' 5"  (1.651 m), weight 259 lb (117.5 kg), SpO2 92%. Body mass index is 43.1 kg/m.  General: Cooperative, alert, well developed, in no acute distress. HEENT: Conjunctivae and lids unremarkable. Cardiovascular: Regular rhythm.  Lungs: Normal work of breathing. Neurologic: No focal deficits.   Lab Results  Component Value Date   CREATININE 1.06 (H) 11/21/2022   BUN 10 11/21/2022   NA 145 (H) 11/21/2022   K 4.0 11/21/2022   CL 104 11/21/2022   CO2 25 11/21/2022   Lab Results  Component Value Date   ALT 17 11/21/2022   AST 22 11/21/2022   ALKPHOS 145 (H) 11/21/2022   BILITOT 0.7 11/21/2022   Lab Results  Component Value Date   HGBA1C 5.9 (H) 11/21/2022   HGBA1C 5.7 (H) 12/06/2021   HGBA1C 5.6 07/19/2021   HGBA1C 5.7 03/04/2021   HGBA1C 5.8 (H) 10/28/2020   Lab Results  Component Value Date   INSULIN 16.8 11/21/2022   INSULIN 7.7 12/06/2021   INSULIN 16.4 07/19/2021   INSULIN 12.3 03/17/2020   INSULIN 15.4 09/09/2019   Lab Results  Component Value Date   TSH 0.73 03/04/2021   Lab Results  Component Value Date   CHOL 159 11/21/2022   HDL 59 11/21/2022   LDLCALC 83 11/21/2022   TRIG 95 11/21/2022   CHOLHDL 2.7 11/21/2022   Lab Results  Component Value Date   VD25OH 50.7 11/21/2022   VD25OH 57.6 12/06/2021   VD25OH 42.9 07/19/2021   Lab Results  Component Value Date   WBC 7.4 03/04/2021   HGB 13.3 03/04/2021   HCT 39 03/04/2021   MCV 91 09/09/2019   PLT 275 03/04/2021   No results found for: "IRON", "TIBC", "FERRITIN"   Attestation Statements:   Reviewed by clinician on day of visit: allergies, medications, problem list, medical history, surgical history, family history, social history, and previous encounter notes.  Time spent on visit including pre-visit chart review and post-visit care and charting was 28 minutes.   I have  reviewed the above documentation for accuracy and completeness, and I agree with the above. -  Lakesha Levinson d. Jessejames Steelman, NP-C

## 2023-03-26 ENCOUNTER — Ambulatory Visit (INDEPENDENT_AMBULATORY_CARE_PROVIDER_SITE_OTHER): Payer: 59 | Admitting: Adult Health

## 2023-03-26 ENCOUNTER — Encounter (INDEPENDENT_AMBULATORY_CARE_PROVIDER_SITE_OTHER): Payer: Self-pay | Admitting: Adult Health

## 2023-03-26 VITALS — BP 133/84 | HR 76 | Temp 97.9°F | Ht 65.0 in | Wt 261.0 lb

## 2023-03-26 DIAGNOSIS — E1159 Type 2 diabetes mellitus with other circulatory complications: Secondary | ICD-10-CM

## 2023-03-26 DIAGNOSIS — E559 Vitamin D deficiency, unspecified: Secondary | ICD-10-CM

## 2023-03-26 DIAGNOSIS — E1169 Type 2 diabetes mellitus with other specified complication: Secondary | ICD-10-CM | POA: Diagnosis not present

## 2023-03-26 DIAGNOSIS — Z6841 Body Mass Index (BMI) 40.0 and over, adult: Secondary | ICD-10-CM

## 2023-03-26 DIAGNOSIS — I152 Hypertension secondary to endocrine disorders: Secondary | ICD-10-CM

## 2023-03-26 DIAGNOSIS — E785 Hyperlipidemia, unspecified: Secondary | ICD-10-CM | POA: Diagnosis not present

## 2023-03-26 DIAGNOSIS — E669 Obesity, unspecified: Secondary | ICD-10-CM

## 2023-03-26 DIAGNOSIS — Z7985 Long-term (current) use of injectable non-insulin antidiabetic drugs: Secondary | ICD-10-CM

## 2023-03-26 MED ORDER — TIRZEPATIDE 5 MG/0.5ML ~~LOC~~ SOAJ: 5.0000 mg | SUBCUTANEOUS | 0 refills | Status: DC

## 2023-03-26 NOTE — Progress Notes (Signed)
WEIGHT SUMMARY AND BIOMETRICS  Vitals Temp: 97.9 F (36.6 C) BP: 133/84 Pulse Rate: 76 SpO2: 98 %   Anthropometric Measurements Height: 5\' 5"  (1.651 m) Weight: 261 lb (118.4 kg) BMI (Calculated): 43.43 Weight at Last Visit: 259lb Weight Lost Since Last Visit: 0 Weight Gained Since Last Visit: 2lb Starting Weight: 274lb Total Weight Loss (lbs): 13 lb (5.897 kg)   Body Composition  Body Fat %: 51.4 % Fat Mass (lbs): 134.4 lbs Muscle Mass (lbs): 120.6 lbs Total Body Water (lbs): 98 lbs Visceral Fat Rating : 17   Other Clinical Data Fasting: no Labs: no Today's Visit #: 16 Starting Date: 09/09/19    Chief Complaint:   OBESITY Vanessa Dean is here to discuss her progress with her obesity treatment plan. She is on the the Category 1 Plan and states she is following her eating plan approximately 30 % of the time.  She states she is exercising: [facility] [place]   Interim History:  10/19/2022 OV at [facility]- Ozempic 1mg  replaced with Mounjaro 5mg  She endorses early satiety. Reviewed Bioimpedance results with pt: Muscle Mass: -9.4 lbs Adipose Mass: +11.2 lbs  Exercise-walking on campus- discussed strategies to increase daily activity   Subjective:   1. Hyperlipidemia associated with type 2 diabetes mellitus (HCC) Lipid Panel     Component Value Date/Time   CHOL 159 11/21/2022 0922   TRIG 95 11/21/2022 0922   HDL 59 11/21/2022 0922   CHOLHDL 2.7 11/21/2022 0922   LDLCALC 83 11/21/2022 0922   LABVLDL 17 11/21/2022 0922   The 10-year ASCVD risk score (Arnett DK, et al., 2019) is: 10.6%   Values used to calculate the score:     Age: 58 years     Sex: Female     Is Non-Hispanic African American: Yes     Diabetic: Yes     Tobacco smoker: No     Systolic Blood Pressure: 133 mmHg     Is BP treated: Yes     HDL Cholesterol: 59 mg/dL     Total Cholesterol: 159 mg/dL   She is on daily Lipitor 10mg  She denies tobacco/vape use  2. Type 2 diabetes mellitus with  other specified complication, without long-term current use of insulin (HCC) Lab Results  Component Value Date   HGBA1C 5.9 (H) 11/21/2022   HGBA1C 5.7 (H) 12/06/2021   HGBA1C 5.6 07/19/2021    10/19/2022 OV at [facility]- Ozempic 1mg  replaced with Mounjaro 5mg  She has remained on 5mg  strength Fasting CBG upper 90s She denies sx's of hypoglycemia She injects Mounjaro 5mg  on sat- will experience nausea without vomiting Sunday Denies mass in neck, dysphagia, dyspepsia, persistent hoarseness, abdominal pain, or constipation   3. Hypertension associated with type 2 diabetes mellitus (HCC) BP stable She is trying to remain as active as her schedule will permit. She denies CP with exertion She endorses stress at work  Assessment/Plan:   1. Hyperlipidemia associated with type 2 diabetes mellitus (HCC) Check Labs If ASCVD risk start >5%- increase statin therapy Increase daily walking  2. Type 2 diabetes mellitus with other specified complication, without long-term current use of insulin (HCC) (Primary) Check Labs Refill  tirzepatide (MOUNJARO) 5 MG/0.5ML Pen Inject 5 mg into the skin once a week. Dispense: 6 mL, Refills: 0 ordered    3. Hypertension associated with type 2 diabetes mellitus (HCC) Check Labs Increase daily walking  4. Obesity, current BMI 43.43  Vanessa Dean is not currently in the action stage of change. As such,  her goal is to get back to weightloss efforts . She has agreed to the Category 1 Plan. +100  Exercise goals: All adults should avoid inactivity. Some physical activity is better than none, and adults who participate in any amount of physical activity gain some health benefits. Adults should also include muscle-strengthening activities that involve all major muscle groups on 2 or more days a week.  Obtain standing desk Obtain walking pad Increase daily walking, try to walk during virtual meetings  Behavioral modification strategies: increasing lean protein intake,  decreasing simple carbohydrates, increasing vegetables, increasing water intake, decreasing sodium intake, no skipping meals, meal planning and cooking strategies, keeping healthy foods in the home, ways to avoid boredom eating, ways to avoid night time snacking, and planning for success.  Vanessa Dean has agreed to follow-up with our clinic in 4 weeks. She was informed of the importance of frequent follow-up visits to maximize her success with intensive lifestyle modifications for her multiple health conditions.   Review Labs at next OV  Objective:   Blood pressure 133/84, pulse 76, temperature 97.9 F (36.6 C), height 5\' 5"  (1.651 m), weight 261 lb (118.4 kg), SpO2 98%. Body mass index is 43.43 kg/m.  General: Cooperative, alert, well developed, in no acute distress. HEENT: Conjunctivae and lids unremarkable. Cardiovascular: Regular rhythm.  Lungs: Normal work of breathing. Neurologic: No focal deficits.   Lab Results  Component Value Date   CREATININE 1.06 (H) 11/21/2022   BUN 10 11/21/2022   NA 145 (H) 11/21/2022   K 4.0 11/21/2022   CL 104 11/21/2022   CO2 25 11/21/2022   Lab Results  Component Value Date   ALT 17 11/21/2022   AST 22 11/21/2022   ALKPHOS 145 (H) 11/21/2022   BILITOT 0.7 11/21/2022   Lab Results  Component Value Date   HGBA1C 5.9 (H) 11/21/2022   HGBA1C 5.7 (H) 12/06/2021   HGBA1C 5.6 07/19/2021   HGBA1C 5.7 03/04/2021   HGBA1C 5.8 (H) 10/28/2020   Lab Results  Component Value Date   INSULIN 16.8 11/21/2022   INSULIN 7.7 12/06/2021   INSULIN 16.4 07/19/2021   INSULIN 12.3 03/17/2020   INSULIN 15.4 09/09/2019   Lab Results  Component Value Date   TSH 0.73 03/04/2021   Lab Results  Component Value Date   CHOL 159 11/21/2022   HDL 59 11/21/2022   LDLCALC 83 11/21/2022   TRIG 95 11/21/2022   CHOLHDL 2.7 11/21/2022   Lab Results  Component Value Date   VD25OH 50.7 11/21/2022   VD25OH 57.6 12/06/2021   VD25OH 42.9 07/19/2021   Lab Results   Component Value Date   WBC 7.4 03/04/2021   HGB 13.3 03/04/2021   HCT 39 03/04/2021   MCV 91 09/09/2019   PLT 275 03/04/2021   No results found for: "IRON", "TIBC", "FERRITIN"  Attestation Statements:   Reviewed by clinician on day of visit: allergies, medications, problem list, medical history, surgical history, family history, social history, and previous encounter notes.  I have reviewed the above documentation for accuracy and completeness, and I agree with the above. -  Sena Clouatre d. Ki Corbo, NP-C

## 2023-03-27 LAB — LIPID PANEL
Chol/HDL Ratio: 2.3 {ratio} (ref 0.0–4.4)
Cholesterol, Total: 153 mg/dL (ref 100–199)
HDL: 67 mg/dL (ref >39)
LDL Chol Calc (NIH): 73 mg/dL (ref 0–99)
Triglycerides: 67 mg/dL (ref 0–149)
VLDL Cholesterol Cal: 13 mg/dL (ref 5–40)

## 2023-03-27 LAB — CMP14+EGFR
ALT: 16 [IU]/L (ref 0–32)
AST: 20 [IU]/L (ref 0–40)
Albumin: 4.1 g/dL (ref 3.8–4.9)
Alkaline Phosphatase: 144 [IU]/L — ABNORMAL HIGH (ref 44–121)
BUN/Creatinine Ratio: 14 (ref 9–23)
BUN: 13 mg/dL (ref 6–24)
Bilirubin Total: 0.6 mg/dL (ref 0.0–1.2)
CO2: 26 mmol/L (ref 20–29)
Calcium: 9.3 mg/dL (ref 8.7–10.2)
Chloride: 103 mmol/L (ref 96–106)
Creatinine, Ser: 0.93 mg/dL (ref 0.57–1.00)
Globulin, Total: 2.9 g/dL (ref 1.5–4.5)
Glucose: 93 mg/dL (ref 70–99)
Potassium: 3.7 mmol/L (ref 3.5–5.2)
Sodium: 143 mmol/L (ref 134–144)
Total Protein: 7 g/dL (ref 6.0–8.5)
eGFR: 72 mL/min/{1.73_m2} (ref >59)

## 2023-03-27 LAB — HEMOGLOBIN A1C
Est. average glucose Bld gHb Est-mCnc: 120 mg/dL
Hgb A1c MFr Bld: 5.8 % — ABNORMAL HIGH (ref 4.8–5.6)

## 2023-03-27 LAB — VITAMIN B12: Vitamin B-12: 869 pg/mL (ref 232–1245)

## 2023-03-27 LAB — INSULIN, RANDOM: INSULIN: 9.5 u[IU]/mL (ref 2.6–24.9)

## 2023-03-27 LAB — VITAMIN D 25 HYDROXY (VIT D DEFICIENCY, FRACTURES): Vit D, 25-Hydroxy: 41.8 ng/mL (ref 30.0–100.0)

## 2023-04-24 ENCOUNTER — Ambulatory Visit (INDEPENDENT_AMBULATORY_CARE_PROVIDER_SITE_OTHER): Payer: 59 | Admitting: Adult Health

## 2023-04-24 ENCOUNTER — Encounter (INDEPENDENT_AMBULATORY_CARE_PROVIDER_SITE_OTHER): Payer: Self-pay | Admitting: Adult Health

## 2023-04-24 VITALS — BP 125/83 | HR 71 | Temp 98.1°F | Ht 65.0 in | Wt 260.0 lb

## 2023-04-24 DIAGNOSIS — E1159 Type 2 diabetes mellitus with other circulatory complications: Secondary | ICD-10-CM | POA: Diagnosis not present

## 2023-04-24 DIAGNOSIS — E66813 Obesity, class 3: Secondary | ICD-10-CM

## 2023-04-24 DIAGNOSIS — E785 Hyperlipidemia, unspecified: Secondary | ICD-10-CM | POA: Diagnosis not present

## 2023-04-24 DIAGNOSIS — E669 Obesity, unspecified: Secondary | ICD-10-CM

## 2023-04-24 DIAGNOSIS — E1169 Type 2 diabetes mellitus with other specified complication: Secondary | ICD-10-CM

## 2023-04-24 DIAGNOSIS — Z7985 Long-term (current) use of injectable non-insulin antidiabetic drugs: Secondary | ICD-10-CM

## 2023-04-24 DIAGNOSIS — E559 Vitamin D deficiency, unspecified: Secondary | ICD-10-CM

## 2023-04-24 DIAGNOSIS — I152 Hypertension secondary to endocrine disorders: Secondary | ICD-10-CM

## 2023-04-24 DIAGNOSIS — Z6841 Body Mass Index (BMI) 40.0 and over, adult: Secondary | ICD-10-CM

## 2023-04-24 MED ORDER — VITAMIN D (ERGOCALCIFEROL) 1.25 MG (50000 UNIT) PO CAPS: 50000.0000 [IU] | ORAL_CAPSULE | ORAL | 0 refills | Status: DC

## 2023-04-24 NOTE — Progress Notes (Signed)
WEIGHT SUMMARY AND BIOMETRICS  Vitals Temp: 98.1 F (36.7 C) BP: 125/83 Pulse Rate: 71 SpO2: 98 %   Anthropometric Measurements Height: 5\' 5"  (1.651 m) Weight: 260 lb (117.9 kg) BMI (Calculated): 43.27 Weight at Last Visit: 261 lb Weight Lost Since Last Visit: 1 lb Weight Gained Since Last Visit: 0 Starting Weight: 274 lb Total Weight Loss (lbs): 14 lb (6.35 kg)   Body Composition  Body Fat %: 46.7 % Fat Mass (lbs): 121.8 lbs Muscle Mass (lbs): 131.8 lbs Total Body Water (lbs): 100.8 lbs Visceral Fat Rating : 16   Other Clinical Data Fasting: yes Labs: no Today's Visit #: 40 Starting Date: 09/09/19    Chief Complaint:   OBESITY Vanessa Dean is here to discuss her progress with her obesity treatment plan.  She is on the the Category 1 Plan and states she is following her eating plan approximately 20 % of the time.  She states she is exercising: [employer] and walking on campus    Interim History:  Reviewed Bioimpedance results with pt: Muscle Mass:+11.2 lbs Adipose Mass: -12.6 lbs Hunger/appetite-she endorses low, yet stable appetite.  Stress- her daughter and her husband both recently experienced Lupus exacerbations.  Her daughter required a 10 day hospitalization - she was recently d/c's home and stable. Vanessa Dean was BS with her daughter while she was admitted.  Exercise-[employer] and campus walking 5 days/week   Subjective:   1. Type 2 diabetes mellitus with other specified complication, without long-term current use of insulin (HCC) Discussed Labs Vitamin B12 232 - 1,245 pg/mL 869    Latest Reference Range & Units 03/26/23 08:37  Glucose 70 - 99 mg/dL 93  Hemoglobin U0A 4.8 - 5.6 % 5.8 (H)  Est. average glucose Bld gHb Est-mCnc mg/dL 540  INSULIN 2.6 - 98.1 uIU/mL 9.5  (H): Data is abnormally high  A1c and Insulin levels improved A1c at goal  10/19/2022 OV at [facility]- Ozempic 1mg  replaced with Mounjaro 5mg  She has remained on 5mg   strength Fasting CBG upper 90s to low 100s She denies sx's of hypoglycemia She injects Mounjaro 5mg  on sat- will experience nausea without vomiting Sunday Denies mass in neck, dysphagia, dyspepsia, persistent hoarseness, abdominal pain, or constipation  2. Hypertension associated with type 2 diabetes mellitus (HCC) Discussed Labs 03/26/2023 CMP: electrolytes, kidney/liver enzymes are stable Kidney fx improved She is on : hydrochlorothiazide (HYDRODIURIL) 12.5 MG tablet  atorvastatin (LIPITOR) 10 MG tablet  valsartan (DIOVAN) 40 MG tablet  tirzepatide (MOUNJARO) 5 MG/0.5ML Pen   3. Hyperlipidemia associated with type 2 diabetes mellitus [facility]) Discussed Labs Lipid Panel     Component Value Date/Time   CHOL 153 03/26/2023 0837   TRIG 67 03/26/2023 0837   HDL 67 03/26/2023 0837   CHOLHDL 2.3 03/26/2023 0837   LDLCALC 73 03/26/2023 0837   LABVLDL 13 03/26/2023 0837   The 10-year ASCVD risk score (Arnett DK, et al., 2019) is: 7.5%   Values used to calculate the score:     Age: 58 years     Sex: Female     Is Non-Hispanic African American: Yes     Diabetic: Yes     Tobacco smoker: No     Systolic Blood Pressure: 125 mmHg     Is BP treated: Yes     HDL Cholesterol: 67 mg/dL     Total Cholesterol: 153 mg/dL   ASCVD slightly above goal of 5% [facility] manages Lipitor 10mg - she denies myalgias   4. Vit D  Def  Latest Reference Range & Units 03/26/23 08:37  Vitamin D, 25-Hydroxy 30.0 - 100.0 ng/mL 41.8       Level below goal of 50-70 She is on daily OTC Vit D 3 1000 and daily OTC MVI  Assessment/Plan:   1. Type 2 diabetes mellitus with other specified complication, without long-term current use of insulin (HCC) Continue healthy eating and regular exercise Continue weekly Mounjaro 5mg - she does not need RF today  2. Hypertension associated with type 2 diabetes mellitus (HCC) Continue healthy eating and regular exercise Continue hydrochlorothiazide (HYDRODIURIL) 12.5 MG tablet   atorvastatin (LIPITOR) 10 MG tablet  valsartan (DIOVAN) 40 MG tablet  tirzepatide (MOUNJARO) 5 MG/0.5ML Pen   3. Hyperlipidemia associated with type 2 diabetes mellitus (HCC) (Primary) Continue healthy eating and regular exercise If ASCVD still above 5% goal at next check, increase statin therapy  4. Vit D Def Stop OTC Vit D Supplementation Start  Vitamin D, Ergocalciferol, ([name]) 1.25 MG (50000 UNIT) CAPS capsule Take 1 capsule (50,000 Units total) by mouth every 7 (seven) days. Dispense: 4 capsule, Refills: 0 ordered   5. Obesity, current BMI 43.4  Vanessa Dean is currently in the action stage of change. As such, her goal is to continue with weight loss efforts. She has agreed to the Category 1 Plan.   Exercise goals: All adults should avoid inactivity. Some physical activity is better than none, and adults who participate in any amount of physical activity gain some health benefits. Adults should also include muscle-strengthening activities that involve all major muscle groups on 2 or more days a week.  Behavioral modification strategies: increasing lean protein intake, decreasing simple carbohydrates, increasing vegetables, increasing water intake, no skipping meals, meal planning and cooking strategies, keeping healthy foods in the home, ways to avoid boredom eating, ways to avoid night time snacking, and planning for success.  Vanessa Dean has agreed to follow-up with our clinic in 4 weeks. She was informed of the importance of frequent follow-up visits to maximize her success with intensive lifestyle modifications for her multiple health conditions.   Objective:   Blood pressure 125/83, pulse 71, temperature 98.1 F (36.7 C), height 5\' 5"  (1.651 m), weight 260 lb (117.9 kg), SpO2 98%. Body mass index is 43.27 kg/m.  General: Cooperative, alert, well developed, in no acute distress. HEENT: Conjunctivae and lids unremarkable. Cardiovascular: Regular rhythm.  Lungs: Normal work of  breathing. Neurologic: No focal deficits.   Lab Results  Component Value Date   CREATININE 0.93 03/26/2023   BUN 13 03/26/2023   NA 143 03/26/2023   K 3.7 03/26/2023   CL 103 03/26/2023   CO2 26 03/26/2023   Lab Results  Component Value Date   ALT 16 03/26/2023   AST 20 03/26/2023   ALKPHOS 144 (H) 03/26/2023   BILITOT 0.6 03/26/2023   Lab Results  Component Value Date   HGBA1C 5.8 (H) 03/26/2023   HGBA1C 5.9 (H) 11/21/2022   HGBA1C 5.7 (H) 12/06/2021   HGBA1C 5.6 07/19/2021   HGBA1C 5.7 03/04/2021   Lab Results  Component Value Date   INSULIN 9.5 03/26/2023   INSULIN 16.8 11/21/2022   INSULIN 7.7 12/06/2021   INSULIN 16.4 07/19/2021   INSULIN 12.3 03/17/2020   Lab Results  Component Value Date   TSH 0.73 03/04/2021   Lab Results  Component Value Date   CHOL 153 03/26/2023   HDL 67 03/26/2023   LDLCALC 73 03/26/2023   TRIG 67 03/26/2023   CHOLHDL 2.3 03/26/2023  Lab Results  Component Value Date   VD25OH 41.8 03/26/2023   VD25OH 50.7 11/21/2022   VD25OH 57.6 12/06/2021   Lab Results  Component Value Date   WBC 7.4 03/04/2021   HGB 13.3 03/04/2021   HCT 39 03/04/2021   MCV 91 09/09/2019   PLT 275 03/04/2021   No results found for: "IRON", "TIBC", "FERRITIN"  Attestation Statements:   Reviewed by clinician on day of visit: allergies, medications, problem list, medical history, surgical history, family history, social history, and previous encounter notes.  I have reviewed the above documentation for accuracy and completeness, and I agree with the above. -  Ronell Boldin d. Avion Kutzer, NP-C

## 2023-05-15 ENCOUNTER — Other Ambulatory Visit (INDEPENDENT_AMBULATORY_CARE_PROVIDER_SITE_OTHER): Payer: Self-pay | Admitting: Adult Health

## 2023-05-23 ENCOUNTER — Encounter (INDEPENDENT_AMBULATORY_CARE_PROVIDER_SITE_OTHER): Payer: Self-pay | Admitting: Adult Health

## 2023-05-23 ENCOUNTER — Ambulatory Visit (INDEPENDENT_AMBULATORY_CARE_PROVIDER_SITE_OTHER): Payer: BC Managed Care – PPO | Admitting: Adult Health

## 2023-05-23 VITALS — BP 138/84 | HR 101 | Temp 97.5°F | Ht 65.0 in | Wt 258.0 lb

## 2023-05-23 DIAGNOSIS — E559 Vitamin D deficiency, unspecified: Secondary | ICD-10-CM

## 2023-05-23 DIAGNOSIS — Z7985 Long-term (current) use of injectable non-insulin antidiabetic drugs: Secondary | ICD-10-CM

## 2023-05-23 DIAGNOSIS — E1159 Type 2 diabetes mellitus with other circulatory complications: Secondary | ICD-10-CM | POA: Diagnosis not present

## 2023-05-23 DIAGNOSIS — Z6841 Body Mass Index (BMI) 40.0 and over, adult: Secondary | ICD-10-CM

## 2023-05-23 DIAGNOSIS — E1169 Type 2 diabetes mellitus with other specified complication: Secondary | ICD-10-CM | POA: Diagnosis not present

## 2023-05-23 DIAGNOSIS — I152 Hypertension secondary to endocrine disorders: Secondary | ICD-10-CM | POA: Diagnosis not present

## 2023-05-23 DIAGNOSIS — E669 Obesity, unspecified: Secondary | ICD-10-CM

## 2023-05-23 DIAGNOSIS — E66813 Morbid (severe) obesity due to excess calories: Secondary | ICD-10-CM

## 2023-05-23 MED ORDER — TIRZEPATIDE 7.5 MG/0.5ML ~~LOC~~ SOAJ: 7.5000 mg | SUBCUTANEOUS | 0 refills | Status: DC

## 2023-05-23 MED ORDER — VITAMIN D (ERGOCALCIFEROL) 1.25 MG (50000 UNIT) PO CAPS: 50000.0000 [IU] | ORAL_CAPSULE | ORAL | 0 refills | Status: DC

## 2023-05-23 NOTE — Progress Notes (Signed)
 WEIGHT SUMMARY AND BIOMETRICS  Vitals Temp: (!) 97.5 F (36.4 C) BP: 138/84 Pulse Rate: (!) 101 SpO2: 98 %   Anthropometric Measurements Height: 5\' 5"  (1.651 m) Weight: 258 lb (117 kg) BMI (Calculated): 42.93 Weight at Last Visit: 260 lb Weight Lost Since Last Visit: 0 lb Weight Gained Since Last Visit: 2 lb Starting Weight: 274 lb Total Weight Loss (lbs): 12 lb (5.443 kg)   Body Composition  Body Fat %: 48.4 % Fat Mass (lbs): 125 lbs Muscle Mass (lbs): 126.4 lbs Total Body Water (lbs): 101.2 lbs Visceral Fat Rating : 16   Other Clinical Data Fasting: yes Labs: no Today's Visit #: 41 Starting Date: 09/09/19    Chief Complaint:   OBESITY Vanessa Dean is here to discuss her progress with her obesity treatment plan.  She is on the the Category 1 Plan +100and states she is following her eating plan approximately 25 % of the time.  She states she is exercising daily walking.   Interim History:  Vanessa Dean completed a 7 day religious fast 7-20 Apr 2023 She was able to consume only water, fish, and vegetables during the fast.  Family update: Her daughters are stable Her husband suffered a fall in Sept 2024- been out of work since then. He also has Lupus Vanessa Dean if often providing total care for her husband  Subjective:   1. Vitamin D deficiency  Latest Reference Range & Units 03/26/23 08:37  Vitamin D, 25-Hydroxy 30.0 - 100.0 ng/mL 41.8   She endorses fatigue She is on weekly Ergocalciferol- denies N/V/Muscle Weakness  2. Hypertension associated with type 2 diabetes mellitus (HCC) BP elevated at OV She reports rushing into OV She denies CP with exertion She denies tobacco/vape She is on  hydrochlorothiazide (HYDRODIURIL) 12.5 MG tablet  atorvastatin (LIPITOR) 10 MG tablet  valsartan (DIOVAN) 40 MG tablet  tirzepatide (MOUNJARO) 7.5 MG/0.5ML Pen   3. Type 2 diabetes mellitus with other specified complication, without long-term current use of  insulin (HCC) 10/19/2022 OV at [facility]- Ozempic 1mg  replaced with Mounjaro 5mg  She has remained on 5mg  strength Fasting CBG upper 90s to low 100s She reports increased appetite the last few weeks. Discussed increasing Mounjaro from 5mg  to 7.5mg - she is agreeable  Lab Results  Component Value Date   HGBA1C 5.8 (H) 03/26/2023   HGBA1C 5.9 (H) 11/21/2022   HGBA1C 5.7 (H) 12/06/2021    Assessment/Plan:   1. Vitamin D deficiency Refill Vitamin D, Ergocalciferol, ([name]) 1.25 MG (50000 UNIT) CAPS capsule Take 1 capsule (50,000 Units total) by mouth every 7 (seven) days. Dispense: 4 capsule, Refills: 0 ordered   2. Hypertension associated with type 2 diabetes mellitus (HCC) Limit Na+ Increase cardiovascular exercise Continue hydrochlorothiazide (HYDRODIURIL) 12.5 MG tablet  atorvastatin (LIPITOR) 10 MG tablet  valsartan (DIOVAN) 40 MG tablet  tirzepatide (MOUNJARO) 7.5 MG/0.5ML Pen   3. Type 2 diabetes mellitus with other specified complication, without long-term current use of insulin (HCC) Refill and INCREASE tirzepatide (MOUNJARO) 7.5 MG/0.5ML Pen Inject 7.5 mg into the skin once a week. Dispense: 6 mL, Refills: 0 ordered   4. Obesity, current BMI 43  Vanessa Dean is currently in the action stage of change. As such, her goal is to continue with weight loss efforts. She has agreed to the Category 1 Plan. +100  Exercise goals: All adults should avoid inactivity. Some physical activity is better than none, and adults who participate in any amount of physical activity gain some health benefits. Adults should  also include muscle-strengthening activities that involve all major muscle groups on 2 or more days a week.  Behavioral modification strategies: increasing lean protein intake, decreasing simple carbohydrates, increasing vegetables, increasing water intake, no skipping meals, meal planning and cooking strategies, keeping healthy foods in the home, and planning for success.  Vanessa Dean has  agreed to follow-up with our clinic in 4 weeks. She was informed of the importance of frequent follow-up visits to maximize her success with intensive lifestyle modifications for her multiple health conditions.   Objective:   Blood pressure 138/84, pulse (!) 101, temperature (!) 97.5 F (36.4 C), height 5\' 5"  (1.651 m), weight 258 lb (117 kg), SpO2 98%. Body mass index is 42.93 kg/m.  General: Cooperative, alert, well developed, in no acute distress. HEENT: Conjunctivae and lids unremarkable. Cardiovascular: Regular rhythm.  Lungs: Normal work of breathing. Neurologic: No focal deficits.   Lab Results  Component Value Date   CREATININE 0.93 03/26/2023   BUN 13 03/26/2023   NA 143 03/26/2023   K 3.7 03/26/2023   CL 103 03/26/2023   CO2 26 03/26/2023   Lab Results  Component Value Date   ALT 16 03/26/2023   AST 20 03/26/2023   ALKPHOS 144 (H) 03/26/2023   BILITOT 0.6 03/26/2023   Lab Results  Component Value Date   HGBA1C 5.8 (H) 03/26/2023   HGBA1C 5.9 (H) 11/21/2022   HGBA1C 5.7 (H) 12/06/2021   HGBA1C 5.6 07/19/2021   HGBA1C 5.7 03/04/2021   Lab Results  Component Value Date   INSULIN 9.5 03/26/2023   INSULIN 16.8 11/21/2022   INSULIN 7.7 12/06/2021   INSULIN 16.4 07/19/2021   INSULIN 12.3 03/17/2020   Lab Results  Component Value Date   TSH 0.73 03/04/2021   Lab Results  Component Value Date   CHOL 153 03/26/2023   HDL 67 03/26/2023   LDLCALC 73 03/26/2023   TRIG 67 03/26/2023   CHOLHDL 2.3 03/26/2023   Lab Results  Component Value Date   VD25OH 41.8 03/26/2023   VD25OH 50.7 11/21/2022   VD25OH 57.6 12/06/2021   Lab Results  Component Value Date   WBC 7.4 03/04/2021   HGB 13.3 03/04/2021   HCT 39 03/04/2021   MCV 91 09/09/2019   PLT 275 03/04/2021   No results found for: "IRON", "TIBC", "FERRITIN"  Attestation Statements:   Reviewed by clinician on day of visit: allergies, medications, problem list, medical history, surgical history,  family history, social history, and previous encounter notes.  I have reviewed the above documentation for accuracy and completeness, and I agree with the above. -  Logan Baltimore d. Domenick Quebedeaux, NP-C

## 2023-06-05 LAB — CBC AND DIFFERENTIAL: WBC: 6

## 2023-06-05 LAB — CBC: RBC: 4.35 (ref 3.87–5.11)

## 2023-06-05 LAB — BASIC METABOLIC PANEL WITH GFR
BUN: 14 (ref 4–21)
CO2: 32 — AB (ref 13–22)
Chloride: 103 (ref 99–108)
Creatinine: 1.1 (ref 0.5–1.1)
Glucose: 76
Potassium: 3.9 meq/L (ref 3.5–5.1)
Sodium: 141 (ref 137–147)

## 2023-06-05 LAB — HEPATIC FUNCTION PANEL
ALT: 17 U/L (ref 7–35)
AST: 21 (ref 13–35)
Alkaline Phosphatase: 121 (ref 25–125)

## 2023-06-05 LAB — IRON,TIBC AND FERRITIN PANEL: Iron: 70

## 2023-06-05 LAB — COMPREHENSIVE METABOLIC PANEL WITH GFR
Albumin: 4.1 (ref 3.5–5.0)
Calcium: 9.3 (ref 8.7–10.7)

## 2023-06-05 LAB — MICROALBUMIN, URINE: Microalb, Ur: 1.15

## 2023-06-05 LAB — PROTEIN / CREATININE RATIO, URINE: Creatinine, Urine: 180

## 2023-07-09 ENCOUNTER — Ambulatory Visit (INDEPENDENT_AMBULATORY_CARE_PROVIDER_SITE_OTHER): Admitting: Adult Health

## 2023-07-09 ENCOUNTER — Telehealth (INDEPENDENT_AMBULATORY_CARE_PROVIDER_SITE_OTHER): Payer: Self-pay | Admitting: *Deleted

## 2023-07-09 VITALS — BP 113/71 | HR 60 | Temp 98.2°F | Ht 65.0 in | Wt 258.0 lb

## 2023-07-09 DIAGNOSIS — F439 Reaction to severe stress, unspecified: Secondary | ICD-10-CM | POA: Diagnosis not present

## 2023-07-09 DIAGNOSIS — E1169 Type 2 diabetes mellitus with other specified complication: Secondary | ICD-10-CM | POA: Diagnosis not present

## 2023-07-09 DIAGNOSIS — I152 Hypertension secondary to endocrine disorders: Secondary | ICD-10-CM

## 2023-07-09 DIAGNOSIS — E669 Obesity, unspecified: Secondary | ICD-10-CM

## 2023-07-09 DIAGNOSIS — E1159 Type 2 diabetes mellitus with other circulatory complications: Secondary | ICD-10-CM

## 2023-07-09 DIAGNOSIS — E66813 Obesity, class 3: Secondary | ICD-10-CM

## 2023-07-09 DIAGNOSIS — Z6841 Body Mass Index (BMI) 40.0 and over, adult: Secondary | ICD-10-CM

## 2023-07-09 DIAGNOSIS — Z7985 Long-term (current) use of injectable non-insulin antidiabetic drugs: Secondary | ICD-10-CM

## 2023-07-09 MED ORDER — TIRZEPATIDE 10 MG/0.5ML ~~LOC~~ SOAJ: 10.0000 mg | SUBCUTANEOUS | 0 refills | Status: DC

## 2023-07-09 MED ORDER — VITAMIN D (ERGOCALCIFEROL) 1.25 MG (50000 UNIT) PO CAPS: 50000.0000 [IU] | ORAL_CAPSULE | ORAL | 0 refills | Status: DC

## 2023-07-09 NOTE — Telephone Encounter (Signed)
 Referral was sent to [place] for psychiatry, left message for call back for status.

## 2023-07-09 NOTE — Progress Notes (Signed)
 WEIGHT SUMMARY AND BIOMETRICS  Vitals Temp: 98.2 F (36.8 C) BP: 113/71 Pulse Rate: 60 SpO2: 98 %   Anthropometric Measurements Height: 5\' 5"  (1.651 m) Weight: 258 lb (117 kg) BMI (Calculated): 42.93 Weight at Last Visit: 257 lb Weight Lost Since Last Visit: 1 lb Weight Gained Since Last Visit: 0 Starting Weight: 274 lb Total Weight Loss (lbs): 13 lb (5.897 kg)   Body Composition  Body Fat %: 47.2 % Fat Mass (lbs): 121.4 lbs Muscle Mass (lbs): 129.2 lbs Total Body Water (lbs): 103.2 lbs Visceral Fat Rating : 15   Other Clinical Data Fasting: yes Labs: no Today's Visit #: 42 Starting Date: 09/09/19    Chief Complaint:   OBESITY Vanessa Dean is here to discuss her progress with her obesity treatment plan.  She is on the the Category 1 Plan+100 and states she is following her eating plan approximately 50 % of the time.  She states she is exercising Walking 15 minutes 3 times per week.   Interim History:  05/23/2023: Mounjaro  increased from 5mg  to 7.5mg  She has had 2 months on the increased strength Denies mass in neck, dysphagia, dyspepsia, persistent hoarseness, abdominal pain, or N/V/C   Stress-  Vanessa Dean graduates from [employer]- will likely move home after graduation [place] is still looking for employment- lives independently in [place] Her husband suffered a fall in Sept 2024- been out of work since then. He also has Lupus Ms. Metheney if often providing total care for her husband  Reviewed Bioimpedance results with pt: Muscle Mass: +2.8 lbs Adipose Mass: -3.6 lbs  Subjective:   1. Type 2 diabetes mellitus with other specified complication, without long-term current use of insulin  (HCC) Lab Results  Component Value Date   HGBA1C 5.8 (H) 03/26/2023   HGBA1C 5.9 (H) 11/21/2022   HGBA1C 5.7 (H) 12/06/2021    She infrequently monitors home CBG Last checked, last level in mid 90s She denies sx's of hypogylcemia She is currently on weekly Mounjaro   7.5mg  Dose increased from 5 to 7.5 mg on/about 05/23/2023 Denies mass in neck, dysphagia, dyspepsia, persistent hoarseness, abdominal pain, or N/V/C  She will experience intermittent breakthrough polyphagia  2. Hypertension associated with type 2 diabetes mellitus (HCC) BP excellent at goal She continues to walk as frequently as she can She denies CP with exertion  3. Stress at home Vanessa Dean graduates from [employer]- will likely move home after graduation Bettye Bruins is still looking for employment- lives independently in [place] Her husband suffered a fall in Sept 2024- been out of work since then. He also has Lupus Ms. Bracknell if often providing total care for her husband  She is not working with a therapy, however agreeable to referral to Mental Health  Assessment/Plan:   1. Type 2 diabetes mellitus with other specified complication, without long-term current use of insulin  (HCC) Refill and INCREASE  tirzepatide  (MOUNJARO ) 10 MG/0.5ML Pen Inject 10 mg into the skin once a week. Dispense: 6 mL, Refills: 0 ordered   2. Hypertension associated with type 2 diabetes mellitus (HCC) Continue healthy eating and regular execise  3. Vit D Def Refill  Vitamin D , Ergocalciferol , ([name] ) 1.25 MG (50000 UNIT) CAPS capsule Take 1 capsule (50,000 Units total) by mouth every 7 (seven) days. Dispense: 4 capsule, Refills: 0 ordered   4. Stress at home Referral - Ambulatory referral to Psychology  4. Obesity, current BMI 42.8 (Primary)  Vanessa Dean is currently in the action stage of change. As such, her  goal is to continue with weight loss efforts. She has agreed to the Category 1 Plan. +100  Exercise goals: All adults should avoid inactivity. Some physical activity is better than none, and adults who participate in any amount of physical activity gain some health benefits. Adults should also include muscle-strengthening activities that involve all major muscle groups on 2 or more days a  week.  Behavioral modification strategies: increasing lean protein intake, decreasing simple carbohydrates, increasing vegetables, increasing water intake, meal planning and cooking strategies, keeping healthy foods in the home, ways to avoid boredom eating, ways to avoid night time snacking, and planning for success.  Marquisha has agreed to follow-up with our clinic in 4 weeks. She was informed of the importance of frequent follow-up visits to maximize her success with intensive lifestyle modifications for her multiple health conditions.   Bring recent Labs from PCP at next OV  Objective:   Blood pressure 113/71, pulse 60, temperature 98.2 F (36.8 C), height 5\' 5"  (1.651 m), weight 258 lb (117 kg), SpO2 98%. Body mass index is 42.93 kg/m.  General: Cooperative, alert, well developed, in no acute distress. HEENT: Conjunctivae and lids unremarkable. Cardiovascular: Regular rhythm.  Lungs: Normal work of breathing. Neurologic: No focal deficits.   Lab Results  Component Value Date   CREATININE 0.93 03/26/2023   BUN 13 03/26/2023   NA 143 03/26/2023   K 3.7 03/26/2023   CL 103 03/26/2023   CO2 26 03/26/2023   Lab Results  Component Value Date   ALT 16 03/26/2023   AST 20 03/26/2023   ALKPHOS 144 (H) 03/26/2023   BILITOT 0.6 03/26/2023   Lab Results  Component Value Date   HGBA1C 5.8 (H) 03/26/2023   HGBA1C 5.9 (H) 11/21/2022   HGBA1C 5.7 (H) 12/06/2021   HGBA1C 5.6 07/19/2021   HGBA1C 5.7 03/04/2021   Lab Results  Component Value Date   INSULIN  9.5 03/26/2023   INSULIN  16.8 11/21/2022   INSULIN  7.7 12/06/2021   INSULIN  16.4 07/19/2021   INSULIN  12.3 03/17/2020   Lab Results  Component Value Date   TSH 0.73 03/04/2021   Lab Results  Component Value Date   CHOL 153 03/26/2023   HDL 67 03/26/2023   LDLCALC 73 03/26/2023   TRIG 67 03/26/2023   CHOLHDL 2.3 03/26/2023   Lab Results  Component Value Date   VD25OH 41.8 03/26/2023   VD25OH 50.7 11/21/2022    VD25OH 57.6 12/06/2021   Lab Results  Component Value Date   WBC 7.4 03/04/2021   HGB 13.3 03/04/2021   HCT 39 03/04/2021   MCV 91 09/09/2019   PLT 275 03/04/2021   No results found for: "IRON", "TIBC", "FERRITIN"  Attestation Statements:   Reviewed by clinician on day of visit: allergies, medications, problem list, medical history, surgical history, family history, social history, and previous encounter notes.  I have reviewed the above documentation for accuracy and completeness, and I agree with the above. -  Vyolet Sakuma d. Vannie Hilgert, NP-C

## 2023-07-17 ENCOUNTER — Telehealth (INDEPENDENT_AMBULATORY_CARE_PROVIDER_SITE_OTHER): Payer: Self-pay | Admitting: *Deleted

## 2023-07-17 NOTE — Telephone Encounter (Signed)
 Called [place](Psychology) back  for status of referral, did receive, waiting on patient to complete her new patient form before scheduling.

## 2023-07-17 NOTE — Telephone Encounter (Signed)
 Error message

## 2023-08-07 ENCOUNTER — Ambulatory Visit (INDEPENDENT_AMBULATORY_CARE_PROVIDER_SITE_OTHER): Admitting: Adult Health

## 2023-08-07 ENCOUNTER — Encounter (INDEPENDENT_AMBULATORY_CARE_PROVIDER_SITE_OTHER): Payer: Self-pay | Admitting: Adult Health

## 2023-08-07 VITALS — BP 122/76 | HR 70 | Temp 98.0°F | Ht 65.0 in | Wt 256.0 lb

## 2023-08-07 DIAGNOSIS — G4733 Obstructive sleep apnea (adult) (pediatric): Secondary | ICD-10-CM

## 2023-08-07 DIAGNOSIS — E1169 Type 2 diabetes mellitus with other specified complication: Secondary | ICD-10-CM

## 2023-08-07 DIAGNOSIS — E1159 Type 2 diabetes mellitus with other circulatory complications: Secondary | ICD-10-CM

## 2023-08-07 DIAGNOSIS — E559 Vitamin D deficiency, unspecified: Secondary | ICD-10-CM

## 2023-08-07 DIAGNOSIS — E66813 Obesity, class 3: Secondary | ICD-10-CM

## 2023-08-07 DIAGNOSIS — Z7985 Long-term (current) use of injectable non-insulin antidiabetic drugs: Secondary | ICD-10-CM

## 2023-08-07 DIAGNOSIS — Z6841 Body Mass Index (BMI) 40.0 and over, adult: Secondary | ICD-10-CM

## 2023-08-07 DIAGNOSIS — I152 Hypertension secondary to endocrine disorders: Secondary | ICD-10-CM | POA: Diagnosis not present

## 2023-08-07 DIAGNOSIS — E669 Obesity, unspecified: Secondary | ICD-10-CM

## 2023-08-07 MED ORDER — VITAMIN D (ERGOCALCIFEROL) 1.25 MG (50000 UNIT) PO CAPS: 50000.0000 [IU] | ORAL_CAPSULE | ORAL | 0 refills | Status: DC

## 2023-08-07 NOTE — Progress Notes (Signed)
 WEIGHT SUMMARY AND BIOMETRICS  Vitals Temp: 98 F (36.7 C) BP: 122/76 Pulse Rate: 70 SpO2: 97 %   Anthropometric Measurements Height: 5\' 5"  (1.651 m) Weight: 256 lb (116.1 kg) BMI (Calculated): 42.6 Weight at Last Visit: 258 lb Weight Lost Since Last Visit: 2 lb Weight Gained Since Last Visit: 0 Starting Weight: 274 l Total Weight Loss (lbs): 15 lb (6.804 kg)   Body Composition  Body Fat %: 50.8 % Fat Mass (lbs): 130 lbs Muscle Mass (lbs): 119.6 lbs Total Body Water (lbs): 976 lbs Visceral Fat Rating : 17   Other Clinical Data Fasting: yes Labs: no Today's Visit #: 39 Starting Date: 09/09/19    Chief Complaint:   OBESITY Brelyn is here to discuss her progress with her obesity treatment plan.  She is on the the Category 1 Plan+100 and states she is following her eating plan approximately 40-50 % of the time.  She states she is exercising 3-6K steps/day.   Interim History:  Reviewed April 2025 Labs completed with PCP- reviewed in Care Everywhere Unable to locate A1c and Vit D Level- if unable to locate results will update at next HWW OV  Hunger/appetite-she has had 4 doses of increased strength Mounjaro  10mg  Denies mass in neck, dysphagia, dyspepsia, persistent hoarseness, abdominal pain, or V/C She will occasionally experience nausea without vomiting  Exercise-daily walking   Subjective:   1. OSA (obstructive sleep apnea) She reports 100% compliance on CPAP Recent H/H with PCP- WNL- results in Care Everywhere She denies excessive daytime fatigue  2. Type 2 diabetes mellitus with other specified complication, without long-term current use of insulin  (HCC) Fasting CBG will range mid 80s- to low 110s 07/09/2023 HWW increased Mounjaro  fro 7.5mg  to 10mg   She has had 4 doses of increased strength Mounjaro  10mg  Denies mass in neck, dysphagia, dyspepsia, persistent hoarseness, abdominal pain, or V/C  She will occasionally experience nausea without  vomiting  3. Hypertension associated with type 2 diabetes mellitus (HCC) BP at goal at OV She has been increasing daily steps, achieves at least 3K/day PCP has assumed management of antihypertensive therapy  4. Vit D Def  Latest Reference Range & Units 12/06/21 08:04 11/21/22 09:22 03/26/23 08:37  Vitamin D , 25-Hydroxy 30.0 - 100.0 ng/mL 57.6 50.7 41.8   She is on weekly Ergocalciferol - N/V/Muscle Weakness  Assessment/Plan:   1. OSA (obstructive sleep apnea) Continue with weight loss efforts Continue nightly CPAP  2. Type 2 diabetes mellitus with other specified complication, without long-term current use of insulin  (HCC) (Primary) Continue Cat 1 MP and increasing daily steps Continue weekly Mounjaro  10mg - denies need for refill today Monitor for sx's of hypoglycemia  3. Hypertension associated with type 2 diabetes mellitus (HCC) Continue Cat 1 MP and increasing daily steps Continue  hydrochlorothiazide  (HYDRODIURIL ) 12.5 MG tablet  atorvastatin  (LIPITOR) 10 MG tablet  tirzepatide  (MOUNJARO ) 10 MG/0.5ML Pen  valsartan  (DIOVAN ) 40 MG tablet   4. Vit D Def Refill Vitamin D , Ergocalciferol , ([name] ) 1.25 MG (50000 UNIT) CAPS capsule Take 1 capsule (50,000 Units total) by mouth every 7 (seven) days. Dispense: 4 capsule, Refills: 0 ordered   4. Obesity, current BMI 42.6  Dorismar is currently in the action stage of change. As such, her goal is to continue with weight loss efforts. She has agreed to the Category 1 Plan.   Exercise goals: All adults should avoid inactivity. Some physical activity is better than none, and adults who participate in any amount of physical activity gain some  health benefits. Adults should also include muscle-strengthening activities that involve all major muscle groups on 2 or more days a week.  Behavioral modification strategies: increasing lean protein intake, decreasing simple carbohydrates, increasing vegetables, increasing water intake, no skipping  meals, meal planning and cooking strategies, keeping healthy foods in the home, and planning for success.  Na has agreed to follow-up with our clinic in 4 weeks. She was informed of the importance of frequent follow-up visits to maximize her success with intensive lifestyle modifications for her multiple health conditions.   Objective:   Blood pressure 122/76, pulse 70, temperature 98 F (36.7 C), height 5\' 5"  (1.651 m), weight 256 lb (116.1 kg), SpO2 97%. Body mass index is 42.6 kg/m.  General: Cooperative, alert, well developed, in no acute distress. HEENT: Conjunctivae and lids unremarkable. Cardiovascular: Regular rhythm.  Lungs: Normal work of breathing. Neurologic: No focal deficits.   Lab Results  Component Value Date   CREATININE 0.93 03/26/2023   BUN 13 03/26/2023   NA 143 03/26/2023   K 3.7 03/26/2023   CL 103 03/26/2023   CO2 26 03/26/2023   Lab Results  Component Value Date   ALT 16 03/26/2023   AST 20 03/26/2023   ALKPHOS 144 (H) 03/26/2023   BILITOT 0.6 03/26/2023   Lab Results  Component Value Date   HGBA1C 5.8 (H) 03/26/2023   HGBA1C 5.9 (H) 11/21/2022   HGBA1C 5.7 (H) 12/06/2021   HGBA1C 5.6 07/19/2021   HGBA1C 5.7 03/04/2021   Lab Results  Component Value Date   INSULIN  9.5 03/26/2023   INSULIN  16.8 11/21/2022   INSULIN  7.7 12/06/2021   INSULIN  16.4 07/19/2021   INSULIN  12.3 03/17/2020   Lab Results  Component Value Date   TSH 0.73 03/04/2021   Lab Results  Component Value Date   CHOL 153 03/26/2023   HDL 67 03/26/2023   LDLCALC 73 03/26/2023   TRIG 67 03/26/2023   CHOLHDL 2.3 03/26/2023   Lab Results  Component Value Date   VD25OH 41.8 03/26/2023   VD25OH 50.7 11/21/2022   VD25OH 57.6 12/06/2021   Lab Results  Component Value Date   WBC 7.4 03/04/2021   HGB 13.3 03/04/2021   HCT 39 03/04/2021   MCV 91 09/09/2019   PLT 275 03/04/2021   No results found for: "IRON", "TIBC", "FERRITIN"  Attestation Statements:    Reviewed by clinician on day of visit: allergies, medications, problem list, medical history, surgical history, family history, social history, and previous encounter notes.  I have reviewed the above documentation for accuracy and completeness, and I agree with the above. -  Jerae Izard d. Yilin Weedon, NP-C

## 2023-08-14 ENCOUNTER — Other Ambulatory Visit (INDEPENDENT_AMBULATORY_CARE_PROVIDER_SITE_OTHER): Payer: Self-pay | Admitting: *Deleted

## 2023-08-28 ENCOUNTER — Other Ambulatory Visit (INDEPENDENT_AMBULATORY_CARE_PROVIDER_SITE_OTHER): Payer: Self-pay | Admitting: Adult Health

## 2023-08-31 ENCOUNTER — Encounter (HOSPITAL_COMMUNITY): Payer: Self-pay | Admitting: Interventional Radiology

## 2023-09-10 ENCOUNTER — Ambulatory Visit (INDEPENDENT_AMBULATORY_CARE_PROVIDER_SITE_OTHER): Admitting: Adult Health

## 2023-09-10 ENCOUNTER — Encounter (INDEPENDENT_AMBULATORY_CARE_PROVIDER_SITE_OTHER): Payer: Self-pay | Admitting: Adult Health

## 2023-09-10 VITALS — BP 114/76 | HR 93 | Temp 97.5°F | Ht 65.0 in | Wt 252.0 lb

## 2023-09-10 DIAGNOSIS — G4733 Obstructive sleep apnea (adult) (pediatric): Secondary | ICD-10-CM

## 2023-09-10 DIAGNOSIS — E559 Vitamin D deficiency, unspecified: Secondary | ICD-10-CM | POA: Diagnosis not present

## 2023-09-10 DIAGNOSIS — E1169 Type 2 diabetes mellitus with other specified complication: Secondary | ICD-10-CM | POA: Diagnosis not present

## 2023-09-10 DIAGNOSIS — E669 Obesity, unspecified: Secondary | ICD-10-CM

## 2023-09-10 DIAGNOSIS — E66813 Obesity, class 3: Secondary | ICD-10-CM

## 2023-09-10 DIAGNOSIS — Z6841 Body Mass Index (BMI) 40.0 and over, adult: Secondary | ICD-10-CM

## 2023-09-10 DIAGNOSIS — Z7985 Long-term (current) use of injectable non-insulin antidiabetic drugs: Secondary | ICD-10-CM

## 2023-09-10 MED ORDER — TIRZEPATIDE 10 MG/0.5ML ~~LOC~~ SOAJ: 10.0000 mg | SUBCUTANEOUS | 0 refills | Status: DC

## 2023-09-10 MED ORDER — VITAMIN D (ERGOCALCIFEROL) 1.25 MG (50000 UNIT) PO CAPS: 50000.0000 [IU] | ORAL_CAPSULE | ORAL | 0 refills | Status: DC

## 2023-09-10 NOTE — Progress Notes (Addendum)
 WEIGHT SUMMARY AND BIOMETRICS  Vitals Temp: (!) 97.5 F (36.4 C) BP: 114/76 Pulse Rate: 93 SpO2: 99 %   Anthropometric Measurements Height: 5' 5 (1.651 m) Weight: 252 lb (114.3 kg) BMI (Calculated): 41.93 Weight at Last Visit: 256lb Weight Lost Since Last Visit: 4lb Weight Gained Since Last Visit: 0 Starting Weight: 274lb Total Weight Loss (lbs): 19 lb (8.618 kg)   Body Composition  Body Fat %: 49.3 % Fat Mass (lbs): 124.2 lbs Muscle Mass (lbs): 121.4 lbs Total Body Water (lbs): 92.8 lbs Visceral Fat Rating : 16   Other Clinical Data Fasting: yes Labs: no Today's Visit #: 39 Starting Date: 09/09/19    Chief Complaint:   OBESITY Vanessa Dean is here to discuss her progress with her obesity treatment plan.  She is on the the Category 1 Plan +100 and states she is following her eating plan approximately 50 % of the time.  She states she is exercising Walking 2-3K steps/daily   Interim History:  She is on a staycation this week- focused on house cleaning/repairs.  Reviewed Bioimpedance results with pt: Muscle Mass: +1.8 lbs Adipose Mass: -5.8 lbs  Her husband will likely have both knees replaced this year. [name] will play a pivotal role in his recovery.  Subjective:   1. Vitamin D  deficiency  Latest Reference Range & Units 03/26/23 08:37  Vitamin D , 25-Hydroxy 30.0 - 100.0 ng/mL 41.8   Will update Vit D level today She is currently on weekly Ergocalciferol - denies N/V/Muscle Weakness  2. Type 2 diabetes mellitus with other specified complication, without long-term current use of insulin  (HCC) Lab Results  Component Value Date   HGBA1C 5.8 (H) 03/26/2023   HGBA1C 5.9 (H) 11/21/2022   HGBA1C 5.7 (H) 12/06/2021    Fasting blood glucose will range from mid 80s to low 100s She will feel a little queasy if she goes a long intervals without PO intake. She is on weekly Mounjaro  10mg  Denies mass in neck, dysphagia, dyspepsia, persistent  hoarseness, abdominal pain, or N/V/C   3. OSA (obstructive sleep apnea) She is on weekly Mounjaro  10mg  Denies mass in neck, dysphagia, dyspepsia, persistent hoarseness, abdominal pain, or N/V/C   She is followed by [facility]  06/25/2023 OSA (obstructive sleep apnea) (ICD-10 - G47.33)       Thank you for allowing me to care for your patient at [facility]. Please see my recommendations and management below.  Mild OSA AHI Baseline 14.5 , AHI Today 0.4  - Their mask and pressures are comfortable and they have clear benefit from pap therapy.  - The patient has shown good compliance to PAP therapy.  - Continue current treatment  - Current APAP setting: 4-16 cm H20  - Changes made : none, trial of larger P10 pillows.  - Patient was reminded of proper care for machine and humidity adjustments.  - Driving should be avoided if sleepy.  - Risk/Harms of untreated OSA were discussed..  Patient adherence to PAP therapy was assessed on the above DOS using objective data from a PAP device download from the machine or a web-based platform that aggregates PAP machine data for analysis and reporting. The assessment indicates the patient is adherent to PAP therapy based on the 70% compliance rule per CMS guidelines. Follow up is scheduled as needed or in 1 year.    Assessment/Plan:   1. Vitamin D  deficiency (Primary) Check Labs - VITAMIN D  25 Hydroxy (Vit-D Deficiency, Fractures) Refill Vitamin D , Ergocalciferol , (  [name] ) 1.25 MG (50000 UNIT) CAPS capsule Take 1 capsule (50,000 Units total) by mouth every 7 (seven) days. Dispense: 8 capsule, Refills: 0 ordered   2. Type 2 diabetes mellitus with other specified complication, without long-term current use of insulin  (HCC) Check Labs - Hemoglobin A1c Refill tirzepatide  (MOUNJARO ) 10 MG/0.5ML Pen Inject 10 mg into the skin once a week. Dispense: 6 mL, Refills: 0 ordered   3. OSA (obstructive sleep apnea) Continue with weight loss  efforts and nightly CPAP F/u with [facility] as advised  4. Obesity, current BMI 41.93  Vanessa Dean is currently in the action stage of change. As such, her goal is to continue with weight loss efforts. She has agreed to the Category 1 Plan.+100  Exercise goals: All adults should avoid inactivity. Some physical activity is better than none, and adults who participate in any amount of physical activity gain some health benefits. Adults should also include muscle-strengthening activities that involve all major muscle groups on 2 or more days a week.  Behavioral modification strategies: increasing lean protein intake, decreasing simple carbohydrates, increasing vegetables, increasing water intake, no skipping meals, meal planning and cooking strategies, keeping healthy foods in the home, ways to avoid boredom eating, and planning for success.  Vanessa Dean has agreed to follow-up with our clinic in 4 weeks. She was informed of the importance of frequent follow-up visits to maximize her success with intensive lifestyle modifications for her multiple health conditions.   Vanessa Dean was informed we would discuss her lab results at her next visit unless there is a critical issue that needs to be addressed sooner. Vanessa Dean agreed to keep her next visit at the agreed upon time to discuss these results.  Objective:   Blood pressure 114/76, pulse 93, temperature (!) 97.5 F (36.4 C), height 5' 5 (1.651 m), weight 252 lb (114.3 kg), SpO2 99%. Body mass index is 41.93 kg/m.  General: Cooperative, alert, well developed, in no acute distress. HEENT: Conjunctivae and lids unremarkable. Cardiovascular: Regular rhythm.  Lungs: Normal work of breathing. Neurologic: No focal deficits.   Lab Results  Component Value Date   CREATININE 1.1 06/05/2023   BUN 14 06/05/2023   NA 141 06/05/2023   K 3.9 06/05/2023   CL 103 06/05/2023   CO2 32 (A) 06/05/2023   Lab Results  Component Value Date   ALT 17 06/05/2023    AST 21 06/05/2023   ALKPHOS 121 06/05/2023   BILITOT 0.6 03/26/2023   Lab Results  Component Value Date   HGBA1C 5.8 (H) 03/26/2023   HGBA1C 5.9 (H) 11/21/2022   HGBA1C 5.7 (H) 12/06/2021   HGBA1C 5.6 07/19/2021   HGBA1C 5.7 03/04/2021   Lab Results  Component Value Date   INSULIN  9.5 03/26/2023   INSULIN  16.8 11/21/2022   INSULIN  7.7 12/06/2021   INSULIN  16.4 07/19/2021   INSULIN  12.3 03/17/2020   Lab Results  Component Value Date   TSH 0.73 03/04/2021   Lab Results  Component Value Date   CHOL 153 03/26/2023   HDL 67 03/26/2023   LDLCALC 73 03/26/2023   TRIG 67 03/26/2023   CHOLHDL 2.3 03/26/2023   Lab Results  Component Value Date   VD25OH 41.8 03/26/2023   VD25OH 50.7 11/21/2022   VD25OH 57.6 12/06/2021   Lab Results  Component Value Date   WBC 6.0 06/05/2023   HGB 13.3 03/04/2021   HCT 39 03/04/2021   MCV 91 09/09/2019   PLT 275 03/04/2021   Lab Results  Component Value Date  IRON 70 06/05/2023   Attestation Statements:   Reviewed by clinician on day of visit: allergies, medications, problem list, medical history, surgical history, family history, social history, and previous encounter notes.  I have reviewed the above documentation for accuracy and completeness, and I agree with the above. -  Savier Trickett d. Armenta Erskin, NP-C

## 2023-09-11 LAB — VITAMIN D 25 HYDROXY (VIT D DEFICIENCY, FRACTURES): Vit D, 25-Hydroxy: 55.8 ng/mL (ref 30.0–100.0)

## 2023-09-11 LAB — HEMOGLOBIN A1C
Est. average glucose Bld gHb Est-mCnc: 114 mg/dL
Hgb A1c MFr Bld: 5.6 % (ref 4.8–5.6)

## 2023-10-15 ENCOUNTER — Encounter (INDEPENDENT_AMBULATORY_CARE_PROVIDER_SITE_OTHER): Payer: Self-pay | Admitting: Adult Health

## 2023-10-15 ENCOUNTER — Ambulatory Visit (INDEPENDENT_AMBULATORY_CARE_PROVIDER_SITE_OTHER): Admitting: Adult Health

## 2023-10-15 VITALS — BP 138/75 | HR 86 | Temp 97.8°F | Ht 65.0 in | Wt 254.0 lb

## 2023-10-15 DIAGNOSIS — Z6841 Body Mass Index (BMI) 40.0 and over, adult: Secondary | ICD-10-CM

## 2023-10-15 DIAGNOSIS — E1159 Type 2 diabetes mellitus with other circulatory complications: Secondary | ICD-10-CM

## 2023-10-15 DIAGNOSIS — E559 Vitamin D deficiency, unspecified: Secondary | ICD-10-CM

## 2023-10-15 DIAGNOSIS — E669 Obesity, unspecified: Secondary | ICD-10-CM

## 2023-10-15 DIAGNOSIS — G4733 Obstructive sleep apnea (adult) (pediatric): Secondary | ICD-10-CM | POA: Diagnosis not present

## 2023-10-15 DIAGNOSIS — E1169 Type 2 diabetes mellitus with other specified complication: Secondary | ICD-10-CM | POA: Diagnosis not present

## 2023-10-15 DIAGNOSIS — E66813 Obesity, class 3: Secondary | ICD-10-CM

## 2023-10-15 DIAGNOSIS — I152 Hypertension secondary to endocrine disorders: Secondary | ICD-10-CM

## 2023-10-15 DIAGNOSIS — Z7985 Long-term (current) use of injectable non-insulin antidiabetic drugs: Secondary | ICD-10-CM

## 2023-10-15 MED ORDER — VITAMIN D (ERGOCALCIFEROL) 1.25 MG (50000 UNIT) PO CAPS: 50000.0000 [IU] | ORAL_CAPSULE | ORAL | 0 refills | Status: DC

## 2023-10-15 NOTE — Progress Notes (Addendum)
 WEIGHT SUMMARY AND BIOMETRICS  Vitals Temp: 97.8 F (36.6 C) BP: 138/75 Pulse Rate: 86 SpO2: 97 %   Anthropometric Measurements Height: 5' 5 (1.651 m) Weight: 254 lb (115.2 kg) BMI (Calculated): 42.27 Weight at Last Visit: 252lb Weight Lost Since Last Visit: 0lb Weight Gained Since Last Visit: 2lb Starting Weight: 274lb Total Weight Loss (lbs): 17 lb (7.711 kg)   Body Composition  Body Fat %: 47.1 % Fat Mass (lbs): 119.8 lbs Muscle Mass (lbs): 127.8 lbs Total Body Water (lbs): 100.4 lbs Visceral Fat Rating : 15   Other Clinical Data Fasting: yes Labs: no Today's Visit #: 45 Starting Date: 09/09/19    Chief Complaint:   OBESITY Vanessa Dean is here to discuss her progress with her obesity treatment plan.  She is on the the Category 1 Plan +100 and states she is following her eating plan approximately 40 % of the time.  She states she is exercising walking 3K steps Monday - Friday while working.  Interim History:  New Student Orientation is this month. She endorses increased stress at work as the 2025-26 [employer] is about to commence  Reviewed Bioimpedance results with pt: Muscle Mass: +6.4 lbs Adipose Mass: -4.4 lbs   Subjective:   1. Type 2 diabetes mellitus with other specified complication, without long-term current use of insulin  (HCC) Discussed Labs  Latest Reference Range & Units 09/10/23 09:24  Hemoglobin A1C 4.8 - 5.6 % 5.6  Est. average glucose Bld gHb Est-mCnc mg/dL 885   J8r at goal HWW is managing weekly Mounjaro  10mg  Denies mass in neck, dysphagia, dyspepsia, persistent hoarseness, abdominal pain, or N/V/C   2. OSA (obstructive sleep apnea) Discussed Labs Recent Fasting labs are stable  3. Hypertension associated with type 2 diabetes mellitus (HCC) Discussed Labs 09/10/2023 CMP: Elevtrolytes, Liver Enzymes, and Kidney fx- stable  BP stable at OV, SBP slightly above goal She is on: hydrochlorothiazide  (HYDRODIURIL ) 12.5 MG  tablet  atorvastatin  (LIPITOR) 10 MG tablet  valsartan  (DIOVAN ) 40 MG tablet  tirzepatide  (MOUNJARO ) 10 MG/0.5ML Pen   4. Vitamin D  deficiency Discussed Labs  Latest Reference Range & Units 09/10/23 09:24  Vitamin D , 25-Hydroxy 30.0 - 100.0 ng/mL 55.8   Vit D level stable and at goal She is on weekly Ergocalciferol - denies SE  Assessment/Plan:   1. Type 2 diabetes mellitus with other specified complication, without long-term current use of insulin  (HCC) (Primary) Increase Cat 1 MP compliance to at least 80% Continue regular walking Continue weekly Mounjaro  10mg - denies need for refill  2. OSA (obstructive sleep apnea) Increase Cat 1 MP compliance to at least 80% Continue regular walking Continue weekly Mounjaro  10mg - denies need for refill  3. Hypertension associated with type 2 diabetes mellitus (HCC) Increase Cat 1 MP compliance to at least 80% Continue regular walking Continue   4. Vitamin D  deficiency Refill Vitamin D , Ergocalciferol , ([name] ) 1.25 MG (50000 UNIT) CAPS capsule Take 1 capsule (50,000 Units total) by mouth every 7 (seven) days. Dispense: 8 capsule, Refills: 0 ordered   5. Obesity, current BMI 42.3  Mliss is not currently in the action stage of change. As such, her goal is to get back to weightloss efforts . She has agreed to the Category 1 Plan.   Exercise goals: All adults should avoid inactivity. Some physical activity is better than none, and adults who participate in any amount of physical activity gain some health benefits. Adults should also include muscle-strengthening activities that involve all major muscle groups on  2 or more days a week. Increase daily walking  1) Increase water intake to 60-80 oz/day 2) Increase Cat 1 MP compliance to at least 80 % 3) Take prescribed meals and higher protein snacks to work  [employer] modification strategies: increasing lean protein intake, decreasing simple carbohydrates, increasing vegetables, increasing  water intake, decreasing eating out, no skipping meals, meal planning and cooking strategies, keeping healthy foods in the home, ways to avoid boredom eating, ways to avoid night time snacking, better snacking choices, and planning for success.  Colin has agreed to follow-up with our clinic in 4 weeks. She was informed of the importance of frequent follow-up visits to maximize her success with intensive lifestyle modifications for her multiple health conditions.   Objective:   Blood pressure 138/75, pulse 86, temperature 97.8 F (36.6 C), height 5' 5 (1.651 m), weight 254 lb (115.2 kg), SpO2 97%. Body mass index is 42.27 kg/m.  General: Cooperative, alert, well developed, in no acute distress. HEENT: Conjunctivae and lids unremarkable. Cardiovascular: Regular rhythm.  Lungs: Normal work of breathing. Neurologic: No focal deficits.   Lab Results  Component Value Date   CREATININE 1.1 06/05/2023   BUN 14 06/05/2023   NA 141 06/05/2023   K 3.9 06/05/2023   CL 103 06/05/2023   CO2 32 (A) 06/05/2023   Lab Results  Component Value Date   ALT 17 06/05/2023   AST 21 06/05/2023   ALKPHOS 121 06/05/2023   BILITOT 0.6 03/26/2023   Lab Results  Component Value Date   HGBA1C 5.6 09/10/2023   HGBA1C 5.8 (H) 03/26/2023   HGBA1C 5.9 (H) 11/21/2022   HGBA1C 5.7 (H) 12/06/2021   HGBA1C 5.6 07/19/2021   Lab Results  Component Value Date   INSULIN  9.5 03/26/2023   INSULIN  16.8 11/21/2022   INSULIN  7.7 12/06/2021   INSULIN  16.4 07/19/2021   INSULIN  12.3 03/17/2020   Lab Results  Component Value Date   TSH 0.73 03/04/2021   Lab Results  Component Value Date   CHOL 153 03/26/2023   HDL 67 03/26/2023   LDLCALC 73 03/26/2023   TRIG 67 03/26/2023   CHOLHDL 2.3 03/26/2023   Lab Results  Component Value Date   VD25OH 55.8 09/10/2023   VD25OH 41.8 03/26/2023   VD25OH 50.7 11/21/2022   Lab Results  Component Value Date   WBC 6.0 06/05/2023   HGB 13.3 03/04/2021   HCT 39  03/04/2021   MCV 91 09/09/2019   PLT 275 03/04/2021   Lab Results  Component Value Date   IRON 70 06/05/2023   Attestation Statements:   Reviewed by clinician on day of visit: allergies, medications, problem list, medical history, surgical history, family history, social history, and previous encounter notes.  I have reviewed the above documentation for accuracy and completeness, and I agree with the above. -  Adalae Baysinger d. Stefen Juba, NP-C

## 2023-10-28 ENCOUNTER — Ambulatory Visit

## 2023-10-28 ENCOUNTER — Ambulatory Visit
Admission: EM | Admit: 2023-10-28 | Discharge: 2023-10-28 | Disposition: A | Discharge status: Home / Self Care | Attending: Emergency Medicine | Admitting: Emergency Medicine

## 2023-10-28 ENCOUNTER — Encounter: Payer: Self-pay | Admitting: Emergency Medicine

## 2023-10-28 DIAGNOSIS — M25551 Pain in right hip: Secondary | ICD-10-CM | POA: Diagnosis not present

## 2023-10-28 DIAGNOSIS — M25561 Pain in right knee: Secondary | ICD-10-CM

## 2023-10-28 DIAGNOSIS — W19XXXA Unspecified fall, initial encounter: Secondary | ICD-10-CM

## 2023-10-28 DIAGNOSIS — M25511 Pain in right shoulder: Secondary | ICD-10-CM

## 2023-10-28 MED ORDER — METHOCARBAMOL 500 MG PO TABS: 500.0000 mg | ORAL_TABLET | Freq: Two times a day (BID) | ORAL | 0 refills | Status: AC

## 2023-10-28 NOTE — ED Triage Notes (Signed)
 Pt presents c/o pain from a fall last night. Pt reports she feels tender/pain in right shoulder, lower back pain, and right knee pain. Pt denies pain in the foot and states she was able to put pressure on her foot but the knee is a lot more painful. She states behind the knee hurts as well and her ROM is limited.

## 2023-10-28 NOTE — ED Provider Notes (Signed)
 [facility]    CSN: [id number] Arrival date & time: 10/28/23  0804      History   Chief Complaint Chief Complaint  Patient presents with   Fall    HPI Vanessa Dean is a 58 y.o. female.  Patient with past history significant for hypertension, type 2 diabetes presents to urgent care with a fall.  Reportedly had mechanical fall last night landing on the right side of her body.  Dors is pain to the right shoulder, low back, and right knee. Denies head strike or LOC. No reported weakness, dizziness, or lightheadedness.  HPI  Past Medical History:  Diagnosis Date   Anemia    Anxiety    Arthritis    Back pain    Constipation    Crohn disease (HCC)    Edema, lower extremity    Fluid retention in legs    GERD (gastroesophageal reflux disease)    High cholesterol    Hypertension    Joint pain    Knee pain    Lactose intolerance    Osteoarthritis    PONV (postoperative nausea and vomiting)    Pre-diabetes    Seasonal allergies    TAKES ZYRTEC AS NEEDED   Sleep apnea    cpap at night   Snores    Vitamin D  deficiency     Patient Active Problem List   Diagnosis Date Noted   At risk for dehydration 09/22/2021   Elevated serum creatinine 08/22/2021   Constipation 07/28/2021   Visceral obesity 06/08/2020   Hyperlipidemia associated with type 2 diabetes mellitus (HCC) 12/08/2019   Hypertension associated with type 2 diabetes mellitus (HCC) 12/08/2019   Other hyperlipidemia pure 11/24/2019   S/P gastric bypass 10/29/2019   Vitamin D  deficiency 10/15/2019   Diabetes mellitus (HCC) 10/15/2019   Class 3 severe obesity with serious comorbidity and body mass index (BMI) of 45.0 to 49.9 in adult 10/15/2019   S/P bilateral breast reduction 02/18/2019   Neck pain 10/08/2018   Back pain 10/08/2018   Symptomatic mammary hypertrophy 10/08/2018   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 10/31/2017    Past Surgical History:  Procedure  Laterality Date   ABDOMINAL HYSTERECTOMY     APPENDECTOMY     BREAST REDUCTION SURGERY Bilateral 02/12/2019   Procedure: MAMMARY REDUCTION  (BREAST);  Surgeon: Lowery Estefana RAMAN, DO;  Location: [facility];  Service: Plastics;  Laterality: Bilateral;   CARPAL TUNNEL RELEASE  05/16/2011   Procedure: CARPAL TUNNEL RELEASE;  Surgeon: Lamar LULLA Leonor Mickey., MD;  Location: [facility];  Service: Orthopedics;  Laterality: Left;   CARPAL TUNNEL RELEASE  08/08/2011   Procedure: CARPAL TUNNEL RELEASE;  Surgeon: Lamar LULLA Leonor Mickey., MD;  Location: [facility];  Service: Orthopedics;  Laterality: Right;   CHOLECYSTECTOMY  1988   COLON SURGERY     patient had intestinal blockage   COLONOSCOPY     ESOPHAGOGASTRODUODENOSCOPY     GASTRIC BYPASS     IR ANGIO INTRA EXTRACRAN SEL COM CAROTID INNOMINATE BILAT MOD SED  05/09/2019   IR ANGIO VERTEBRAL SEL VERTEBRAL BILAT MOD SED  05/09/2019   IR US  GUIDE VASC ACCESS RIGHT  05/09/2019   KNEE ARTHROSCOPY  05/09/2010   right    KNEE ARTHROSCOPY  05/10/2007   left   LUMBAR LAMINECTOMY/DECOMPRESSION MICRODISCECTOMY N/A 10/31/2017   Procedure: LUMBAR LAMINECTOMY/DECOMPRESSION MICRODISCECTOMY ONE LEVEL  LUMBAR FOUR - LUMBAR FIVE DECOMPRESSION;  Surgeon: Burnetta Aures,  MD;  Location: [facility];  Service: Orthopedics;  Laterality: N/A;  LUMBAR LAMINECTOMY/DECOMPRESSION MICRODISCECTOMY ONE LEVEL  LUMBAR FOUR - LUMBAR FIVE DECOMPRESSION   TOTAL VAGINAL HYSTERECTOMY  07/20/2005   posterior colporrhaphy    OB History     Gravida  3   Para  3   Term      Preterm      AB      Living         SAB      IAB      Ectopic      Multiple      Live Births               Home Medications    Prior to Admission medications   Medication Sig Start Date End Date Taking? Authorizing Provider  methocarbamol  (ROBAXIN ) 500 MG tablet Take 1 tablet (500 mg total) by mouth 2 (two) times daily. 10/28/23  Yes Kemar Pandit A, PA-C   acetaminophen  (TYLENOL ) 500 MG tablet Take 500-1,000 mg by mouth every 6 (six) hours as needed for moderate pain or headache.    [provider]  Ashwagandha (ASHWAGANDHA 35) 120 MG CAPS Take by mouth. Patient not taking: Reported on 09/10/2023    [provider]  atorvastatin  (LIPITOR) 10 MG tablet Take 1 tablet (10 mg total) by mouth every evening. 09/22/21   Danford, Rockie D, NP  ciprofloxacin-dexamethasone  (CIPRODEX) OTIC suspension INSTILL 4 DROPS INTO AFFECTED EAR TWICE A DAY FOR 7 DAYS Patient not taking: Reported on 09/10/2023    [provider]  diphenhydrAMINE  (BENADRYL ) 25 MG tablet Take 25 mg by mouth daily as needed for allergies.    [provider]  diphenhydramine -acetaminophen  (TYLENOL  PM) 25-500 MG TABS tablet Take 1-2 tablets by mouth at bedtime as needed (sleep).    [provider]  Elderberry 500 MG CAPS 1 capsule Orally once a day    [provider]  fluticasone (FLONASE) 50 MCG/ACT nasal spray 1 SPRAY INTO INTO EACH NOSTRIL TWICE A DAY    [provider]  hydrochlorothiazide  (HYDRODIURIL ) 12.5 MG tablet Take as needed for edema. Max 1 tab per day. 09/22/21   Danford, Katy D, NP  loratadine (CLARITIN) 10 MG tablet TAKE 1 TABLET BY MOUTH EVERY DAY FOR 30 DAYS    [provider]  Misc Natural Products (BEET ROOT PO) Take by mouth.    [provider]  Multiple Vitamins-Minerals (MULTIVITAMIN WITH MINERALS) tablet Take 1 tablet by mouth daily.    [provider]  ondansetron  (ZOFRAN ) 4 MG tablet Take 1 tablet (4 mg total) by mouth every 6 (six) hours. Patient taking differently: Take 4 mg by mouth as needed. 06/30/19   Ward, Josette SAILOR, DO  promethazine -dextromethorphan (PROMETHAZINE -DM) 6.25-15 MG/5ML syrup 5 mL Orally every 6 hrs as needed for cough Patient not taking: Reported on 09/10/2023 07/12/22   [provider]  tirzepatide  (MOUNJARO ) 10 MG/0.5ML Pen Inject 10 mg into the skin once a  week. 09/10/23   Danford, Rockie D, NP  UNABLE TO FIND Take 1 capsule by mouth every 7 (seven) days.    [provider]  valsartan  (DIOVAN ) 40 MG tablet Take 1 tablet by mouth daily. 06/03/21   [provider]  Vitamin D , Ergocalciferol , ([name] ) 1.25 MG (50000 UNIT) CAPS capsule Take 1 capsule (50,000 Units total) by mouth every 7 (seven) days. 10/15/23   Jonel Rockie BIRCH, NP    Family History Family History  Problem Relation Age of Onset  Diabetes Mother    Hypertension Mother    Hyperlipidemia Mother    Heart disease Mother    Kidney disease Mother    Depression Mother    Anxiety disorder Mother    Obesity Mother    Hypertension Father    Cancer Father    Alcohol abuse Paternal Uncle    Diabetes Paternal Uncle     Social History Social History   Tobacco Use   Smoking status: Never    Passive exposure: Never   Smokeless tobacco: Never  Vaping Use   Vaping status: Never Used  Substance Use Topics   Alcohol use: Yes    Comment: SOCIAL   Drug use: No     Allergies   Oxycodone -acetaminophen  and Celecoxib   Review of Systems Review of Systems  Musculoskeletal:        Knee pain, back pain, shoulder pain  All other systems reviewed and are negative.    Physical Exam Triage Vital Signs ED Triage Vitals  Encounter Vitals Group     BP      Girls Systolic BP Percentile      Girls Diastolic BP Percentile      Boys Systolic BP Percentile      Boys Diastolic BP Percentile      Pulse      Resp      Temp      Temp src      SpO2      Weight      Height      Head Circumference      Peak Flow      Pain Score      Pain Loc      Pain Education      Exclude from Growth Chart    No data found.  Updated Vital Signs BP 134/88 (BP Location: Left Arm)   Pulse 69   Temp 98.3 F (36.8 C) (Oral)   Resp 18   Wt 253 lb 15.5 oz (115.2 kg)   SpO2 97%   BMI 42.26 kg/m   Visual Acuity Right Eye Distance:   Left Eye Distance:   Bilateral Distance:     Right Eye Near:   Left Eye Near:    Bilateral Near:     Physical Exam Vitals and nursing note reviewed.  Constitutional:      General: She is not in acute distress.    Appearance: She is well-developed.  HENT:     Head: Normocephalic and atraumatic.  Eyes:     Conjunctiva/sclera: Conjunctivae normal.  Cardiovascular:     Rate and Rhythm: Normal rate and regular rhythm.     Heart sounds: No murmur heard. Pulmonary:     Effort: Pulmonary effort is normal. No respiratory distress.     Breath sounds: Normal breath sounds.  Abdominal:     Palpations: Abdomen is soft.     Tenderness: There is no abdominal tenderness.  Musculoskeletal:        General: Tenderness present. No swelling, deformity or signs of injury.     Cervical back: Neck supple.     Comments: TTP along the right knee, right hip, and right shoulder but no obvious swelling, bruising, or deformities. ROM in these areas is unremarkable but only slightly diminished due to pain.  Skin:    General: Skin is warm and dry.     Capillary Refill: Capillary refill takes less than 2 seconds.  Neurological:     Mental Status: She is alert.  Psychiatric:        Mood and Affect: Mood normal.      UC Treatments / Results  Labs (all labs ordered are listed, but only abnormal results are displayed) Labs Reviewed - No data to display  EKG   Radiology DG Hip Unilat W or Wo Pelvis 2-3 Views Right Result Date: 10/28/2023 EXAM: 2 or 3 VIEW(S) XRAY OF THE RIGHT HIP 10/28/2023 09:29:36 AM COMPARISON: CT 06/09/2015 CLINICAL HISTORY: Fall. FINDINGS: BONES AND JOINTS: No acute fracture or focal osseous lesion. The hip joint is maintained. No significant degenerative changes. Pelvic phleboliths. SOFT TISSUES: The soft tissues are unremarkable. IMPRESSION: 1. No evidence of acute traumatic injury. Electronically signed by: Katheleen Faes MD 10/28/2023 10:22 AM EDT RP Workstation: [id number]   DG Shoulder Right Result Date:  10/28/2023 EXAM: 1 VIEW XRAY OF THE RIGHT SHOULDER 10/28/2023 09:29:36 AM COMPARISON: 09/11/2012 CLINICAL HISTORY: Fall. FINDINGS: BONES AND JOINTS: Glenohumeral joint is normally aligned. No acute fracture or dislocation. The [facility] joint is unremarkable in appearance. SOFT TISSUES: No abnormal calcifications. Visualized lung is unremarkable. IMPRESSION: 1. No evidence of acute traumatic injury. Electronically signed by: Katheleen Faes MD 10/28/2023 10:20 AM EDT RP Workstation: [id number]   DG Knee Complete 4 Views Right Result Date: 10/28/2023 EXAM: 4 or more VIEW(S) XRAY OF THE RIGHT KNEE 10/28/2023 09:29:36 AM COMPARISON: 2021 CLINICAL HISTORY: Fall/injury. Pt presents c/o pain from a fall last night. Pt reports right knee pain. Pt denies pain in the foot and states she was able to put pressure on her foot but the knee is a lot more painful. She states behind the knee hurts as well and her ROM is limited. FINDINGS: BONES AND JOINTS: No acute fracture. Progressive marginal spurring from the medial tibial plateau and lateral femoral condyle. Smaller marginal spurs about the patellofemoral compartment. Mild narrowing of the articular cartilage in the medial compartment. Progressive benign-appearing medullary chondroid lesion in the distal femoral metaphysis. SOFT TISSUES: The soft tissues are unremarkable. IMPRESSION: 1. No acute fracture or dislocation. 2. Progressive degenerative change. Electronically signed by: Katheleen Faes MD 10/28/2023 10:18 AM EDT RP Workstation: [id number]    Procedures Procedures (including critical care time)  Medications Ordered in UC Medications - No data to display  Initial Impression / Assessment and Plan / UC Course  I have reviewed the triage vital signs and the nursing notes.  Pertinent labs & imaging results that were available during my care of the patient were reviewed by me and considered in my medical decision making (see chart for details).  This patient presents  to the UC for concern of fall.  Differential diagnosis includes patellar dislocation, femur fracture, lumbar strain, shoulder strain   Imaging Studies ordered:  I ordered imaging studies including x-ray of the right shoulder, right knee, and right hip I independently visualized and interpreted imaging which showed negative for any acute traumatic injuries, right knee osteoarthritis progressively worsening I agree with the radiologist interpretation  Problem List / UC Course:  Patient presents to urgent care today with concerns of a fall.  Reports mechanical fall occurred yesterday while she was walking down the stairs and reportedly had her right leg bent under her.  She endorses pain primary to the right knee but also has some pain in the right hip and right shoulder.  No reported head strike or loss of consciousness.  Has not taken any medications or tried interventions to help with her pain. Physical exam is largely markable with no bruising, swelling, and only  mild tenderness to palpation of the right knee, right hip, and right shoulder.  Range of motion largely intact although testing at the knee is somewhat limited due to pain.  Pain appears to be primarily to the posterior aspect of the right knee.  No appreciable leg swelling when comparing legs.  Will proceed with x-ray imaging of the right knee, right hip, and right shoulder. Xrays negative for any acute injuries or findings. Will discharge with instructions to take Tylenol , ibuprofen , and naproxen . Robaxin  sent to pharmacy. Advised return precautions such as concerns for new or worsening symptoms. Discharged home in stable condition.   Social Determinants of Health:  None  Final Clinical Impressions(s) / UC Diagnoses   Final diagnoses:  Fall, initial encounter  Acute pain of right knee  Acute pain of right shoulder  Acute right hip pain     Discharge Instructions      You were seen today for concerns of a fall. Your xray  imaging was negative for any signs of fracture or dislocation. Your right knee did notably have worsening arthritis present that is likely causing some increased pain in this area. I would suggest using a knee sleeve to provide additional support for the next several days. Take Tylenol , ibuprofen , or naproxen  for pain. I am sending you a prescription for Robaxin  to further help with your pain. If your symptoms are worsening, follow up with your primary care provider or go to your nearest ER.     ED Prescriptions     Medication Sig Dispense Auth. Provider   methocarbamol  (ROBAXIN ) 500 MG tablet Take 1 tablet (500 mg total) by mouth 2 (two) times daily. 20 tablet Alexica Schlossberg A, PA-C      PDMP not reviewed this encounter.   Dyke Weible A, PA-C 10/28/23 1027

## 2023-10-28 NOTE — Discharge Instructions (Signed)
 You were seen today for concerns of a fall. Your xray imaging was negative for any signs of fracture or dislocation. Your right knee did notably have worsening arthritis present that is likely causing some increased pain in this area. I would suggest using a knee sleeve to provide additional support for the next several days. Take Tylenol , ibuprofen , or naproxen  for pain. I am sending you a prescription for Robaxin  to further help with your pain. If your symptoms are worsening, follow up with your primary care provider or go to your nearest ER.

## 2023-11-12 ENCOUNTER — Ambulatory Visit (INDEPENDENT_AMBULATORY_CARE_PROVIDER_SITE_OTHER): Admitting: Adult Health

## 2023-11-12 ENCOUNTER — Encounter (INDEPENDENT_AMBULATORY_CARE_PROVIDER_SITE_OTHER): Payer: Self-pay | Admitting: Adult Health

## 2023-11-12 VITALS — BP 112/74 | HR 68 | Temp 98.6°F | Ht 65.0 in | Wt 252.0 lb

## 2023-11-12 DIAGNOSIS — Z6841 Body Mass Index (BMI) 40.0 and over, adult: Secondary | ICD-10-CM

## 2023-11-12 DIAGNOSIS — Z9181 History of falling: Secondary | ICD-10-CM | POA: Diagnosis not present

## 2023-11-12 DIAGNOSIS — E669 Obesity, unspecified: Secondary | ICD-10-CM

## 2023-11-12 DIAGNOSIS — E1159 Type 2 diabetes mellitus with other circulatory complications: Secondary | ICD-10-CM | POA: Diagnosis not present

## 2023-11-12 DIAGNOSIS — Z7985 Long-term (current) use of injectable non-insulin antidiabetic drugs: Secondary | ICD-10-CM

## 2023-11-12 DIAGNOSIS — E1169 Type 2 diabetes mellitus with other specified complication: Secondary | ICD-10-CM

## 2023-11-12 DIAGNOSIS — E559 Vitamin D deficiency, unspecified: Secondary | ICD-10-CM

## 2023-11-12 DIAGNOSIS — I152 Hypertension secondary to endocrine disorders: Secondary | ICD-10-CM

## 2023-11-12 NOTE — Progress Notes (Signed)
 WEIGHT SUMMARY AND BIOMETRICS  Vitals Temp: 98.6 F (37 C) BP: 112/74 Pulse Rate: 68 SpO2: 99 %   Anthropometric Measurements Height: 5' 5 (1.651 m) Weight: 252 lb (114.3 kg) BMI (Calculated): 41.93 Weight at Last Visit: 254 lb Weight Lost Since Last Visit: 2 lb Weight Gained Since Last Visit: 0 Starting Weight: 274 lb Total Weight Loss (lbs): 22 lb (9.979 kg)   Body Composition  Body Fat %: 45.3 % Fat Mass (lbs): 114.6 lbs Muscle Mass (lbs): 131.2 lbs Total Body Water (lbs): 103.2 lbs Visceral Fat Rating : 15   Other Clinical Data Fasting: yes Labs: no Today's Visit #: 64 Starting Date: 09/09/19    Chief Complaint:   OBESITY Vanessa Dean is here to discuss her progress with her obesity treatment plan.  She is on the the Category 1 Plan +100 2 and states she is following her eating plan approximately 45 % of the time.  She states she is exercising Walking 2000-3000 steps daily.  Interim History:  Reviewed Bioempidence results with pt: Muscle Mass: +3.4 lbs Adipose Mass: -5.2 lbs  She suffered a fall 10/27/2023 Reviewed UC notes, imaging with pt  She endorses current pain, edema, and stiff/guarded ambulation.  Subjective:   1. History of fall 10/28/2023 UC Encounter Notes HPI Vanessa Dean is a 58 y.o. female.  Patient with past history significant for hypertension, type 2 diabetes presents to urgent care with a fall.  Reportedly had mechanical fall last night landing on the right side of her body.  Dors is pain to the right shoulder, low back, and right knee. Denies head strike or LOC. No reported weakness, dizziness, or lightheadedness.  Initial Impression / Assessment and Plan / UC Course  I have reviewed the triage vital signs and the nursing notes.   Pertinent labs & imaging results that were available during my care of the patient were reviewed by me and considered in my medical decision making (see chart for details).   This patient presents  to the UC for concern of fall.  Differential diagnosis includes patellar dislocation, femur fracture, lumbar strain, shoulder strain  Imaging Studies ordered:   I ordered imaging studies including x-ray of the right shoulder, right knee, and right hip I independently visualized and interpreted imaging which showed negative for any acute traumatic injuries, right knee osteoarthritis progressively worsening I agree with the radiologist interpretation  Discharge Instructions        You were seen today for concerns of a fall. Your xray imaging was negative for any signs of fracture or dislocation. Your right knee did notably have worsening arthritis present that is likely causing some increased pain in this area. I would suggest using a knee sleeve to provide additional support for the next several days. Take Tylenol , ibuprofen , or naproxen  for pain. I am sending you a prescription for Robaxin  to further help with your pain. If your symptoms are worsening, follow up with your primary care provider or go to your nearest ER.  2. Type 2 diabetes mellitus with other specified complication, without long-term current use of insulin  (HCC) HWW manages weekly Mounjaro  10mg  Home fastin CBG this morning was 84 She denies sx's of hypoglycemia  3. Hypertension associated with type 2 diabetes mellitus (HCC) BP at goal at OV Vanessa Dean is able to walk briskly without dyspnea or CP She is on hydrochlorothiazide  (HYDRODIURIL ) 12.5 MG tablet  atorvastatin  (LIPITOR) 10 MG tablet  valsartan  (DIOVAN ) 40 MG tablet  tirzepatide  (MOUNJARO )  10 MG/0.5ML Pen   4. Vitamin D  deficiency  Latest Reference Range & Units 11/21/22 09:22 03/26/23 08:37 09/10/23 09:24  Vitamin D , 25-Hydroxy 30.0 - 100.0 ng/mL 50.7 41.8 55.8   She is on weekly Ergocalciferol - denies N/V/Muscle Weakness  Assessment/Plan:   1. History of fall (Primary) Activity as tolerated Apply ice, rest, elevated leg when able  2. Type 2 diabetes  mellitus with other specified complication, without long-term current use of insulin  [facility]) Check Fasting Labs Fall 2025 Continue weekly Mounjaro  10mg - does not require refill today  3. Hypertension associated with type 2 diabetes mellitus [facility]) Check Fasting Labs Fall 2025 Continue daily walking and current antihypertensive therapy  4. Vitamin D  deficiency Check Fasting Labs Fall 2025 Continue weekly Ergocalciferol - denies need for refill today  5. Obesity, current BMI 42.3  Vanessa Dean is currently in the action stage of change. As such, her goal is to continue with weight loss efforts. She has agreed to the Category 1 Plan. +100 cal  Exercise goals: All adults should avoid inactivity. Some physical activity is better than none, and adults who participate in any amount of physical activity gain some health benefits. Adults should also include muscle-strengthening activities that involve all major muscle groups on 2 or more days a week. Daily walking  Behavioral modification strategies: increasing lean protein intake, decreasing simple carbohydrates, increasing vegetables, increasing water intake, no skipping meals, meal planning and cooking strategies, keeping healthy foods in the home, ways to avoid boredom eating, and planning for success.  Vanessa Dean has agreed to follow-up with our clinic in 4 weeks. She was informed of the importance of frequent follow-up visits to maximize her success with intensive lifestyle modifications for her multiple health conditions.   Check Fasting Labs Fall 2025  Objective:   Blood pressure 112/74, pulse 68, temperature 98.6 F (37 C), height 5' 5 (1.651 m), weight 252 lb (114.3 kg), SpO2 99%. Body mass index is 41.93 kg/m.  General: Cooperative, alert, well developed, in no acute distress. HEENT: Conjunctivae and lids unremarkable. Cardiovascular: Regular rhythm.  Lungs: Normal work of breathing. Neurologic: No focal deficits.   Lab Results  Component  Value Date   CREATININE 1.1 06/05/2023   BUN 14 06/05/2023   NA 141 06/05/2023   K 3.9 06/05/2023   CL 103 06/05/2023   CO2 32 (A) 06/05/2023   Lab Results  Component Value Date   ALT 17 06/05/2023   AST 21 06/05/2023   ALKPHOS 121 06/05/2023   BILITOT 0.6 03/26/2023   Lab Results  Component Value Date   HGBA1C 5.6 09/10/2023   HGBA1C 5.8 (H) 03/26/2023   HGBA1C 5.9 (H) 11/21/2022   HGBA1C 5.7 (H) 12/06/2021   HGBA1C 5.6 07/19/2021   Lab Results  Component Value Date   INSULIN  9.5 03/26/2023   INSULIN  16.8 11/21/2022   INSULIN  7.7 12/06/2021   INSULIN  16.4 07/19/2021   INSULIN  12.3 03/17/2020   Lab Results  Component Value Date   TSH 0.73 03/04/2021   Lab Results  Component Value Date   CHOL 153 03/26/2023   HDL 67 03/26/2023   LDLCALC 73 03/26/2023   TRIG 67 03/26/2023   CHOLHDL 2.3 03/26/2023   Lab Results  Component Value Date   VD25OH 55.8 09/10/2023   VD25OH 41.8 03/26/2023   VD25OH 50.7 11/21/2022   Lab Results  Component Value Date   WBC 6.0 06/05/2023   HGB 13.3 03/04/2021   HCT 39 03/04/2021   MCV 91 09/09/2019   PLT 275 03/04/2021  Attestation Statements:   Reviewed by clinician on day of visit: allergies, medications, problem list, medical history, surgical history, family history, social history, and previous encounter notes.  Time spent on visit including pre-visit chart review and post-visit care and charting was 27 minutes.   I have reviewed the above documentation for accuracy and completeness, and I agree with the above. -  Ellwood Steidle d. Delancey Moraes, NP-C

## 2023-12-11 ENCOUNTER — Ambulatory Visit (INDEPENDENT_AMBULATORY_CARE_PROVIDER_SITE_OTHER): Admitting: Adult Health

## 2023-12-11 ENCOUNTER — Encounter (INDEPENDENT_AMBULATORY_CARE_PROVIDER_SITE_OTHER): Payer: Self-pay | Admitting: Adult Health

## 2023-12-11 VITALS — BP 130/72 | HR 91 | Temp 98.0°F | Ht 65.0 in | Wt 251.0 lb

## 2023-12-11 DIAGNOSIS — E669 Obesity, unspecified: Secondary | ICD-10-CM

## 2023-12-11 DIAGNOSIS — E66813 Obesity, class 3: Secondary | ICD-10-CM

## 2023-12-11 DIAGNOSIS — E1169 Type 2 diabetes mellitus with other specified complication: Secondary | ICD-10-CM | POA: Diagnosis not present

## 2023-12-11 DIAGNOSIS — I152 Hypertension secondary to endocrine disorders: Secondary | ICD-10-CM

## 2023-12-11 DIAGNOSIS — E1159 Type 2 diabetes mellitus with other circulatory complications: Secondary | ICD-10-CM

## 2023-12-11 DIAGNOSIS — Z6841 Body Mass Index (BMI) 40.0 and over, adult: Secondary | ICD-10-CM

## 2023-12-11 DIAGNOSIS — E559 Vitamin D deficiency, unspecified: Secondary | ICD-10-CM | POA: Diagnosis not present

## 2023-12-11 DIAGNOSIS — Z7985 Long-term (current) use of injectable non-insulin antidiabetic drugs: Secondary | ICD-10-CM

## 2023-12-11 NOTE — Progress Notes (Signed)
 WEIGHT SUMMARY AND BIOMETRICS  Vitals Temp: 98 F (36.7 C) BP: 130/72 Pulse Rate: 91 SpO2: 99 %   Anthropometric Measurements Height: 5' 5 (1.651 m) Weight: 251 lb (113.9 kg) BMI (Calculated): 41.77 Weight at Last Visit: 252lb Weight Lost Since Last Visit: 1lb Weight Gained Since Last Visit: 0lb Starting Weight: 274lb Total Weight Loss (lbs): 23 lb (10.4 kg)   Body Composition  Body Fat %: 50.4 % Fat Mass (lbs): 126.4 lbs Muscle Mass (lbs): 118.4 lbs Visceral Fat Rating : 16   Other Clinical Data Fasting: yes Labs: No Today's Visit #: 28 Starting Date: 09/09/19    Chief Complaint:   OBESITY Vanessa Dean is here to discuss her progress with her obesity treatment plan.  She is on the the Category 1 Plan and states she is following her eating plan approximately 50 % of the time.  She states she is exercising Walking 15-20 minutes 7 times per week.  Interim History:  [employer]- she is Interim Public house manager of [employer] She estimates to work 8-10 hours Mon  12/06/2023 OV with PCP/Dr. Teresa Recommended to have COVID Booster when available No Labs completed No Change in medications  Exercise-she is trying move everyday Daily Goal: 3,000 steps or more   Subjective:   1. Vitamin D  deficiency  Latest Reference Range & Units 11/21/22 09:22 03/26/23 08:37 09/10/23 09:24  Vitamin D , 25-Hydroxy 30.0 - 100.0 ng/mL 50.7 41.8 55.8   She is on weekly Ergocalciferol - denies N/V/Muscle Weakness  2. Type 2 diabetes mellitus with other specified complication, without long-term current use of insulin  [facility]) Lab Results  Component Value Date   HGBA1C 5.6 09/10/2023   HGBA1C 5.8 (H) 03/26/2023   HGBA1C 5.9 (H) 11/21/2022    10/19/2022 Ozempic  1mg  replaced with Mounjaro  5mg  05/23/2023 Mounjaro  increased from 5 to 7.5 mg on/about  07/09/2023 Mounjaro  7.5mg  increased to 10mg  Home CBG will range from upper 80s to low 100s She denies sx's of hypoglycemia Denies mass in neck,  dysphagia, dyspepsia, persistent hoarseness, abdominal pain, or N/V/C   3. Hypertension associated with type 2 diabetes mellitus (HCC) BP Stable at OV She is on  hydrochlorothiazide  (HYDRODIURIL ) 12.5 MG tablet  atorvastatin  (LIPITOR) 10 MG tablet  valsartan  (DIOVAN ) 40 MG tablet  tirzepatide  (MOUNJARO ) 10 MG/0.5ML Pen   Assessment/Plan:   1. Vitamin D  deficiency (Primary) Check Labs - VITAMIN D  25 Hydroxy (Vit-D Deficiency, Fractures)  2. Type 2 diabetes mellitus with other specified complication, without long-term current use of insulin  (HCC) Check Labs - Hemoglobin A1c - Insulin , random - Vitamin B12  3. Hypertension associated with type 2 diabetes mellitus (HCC) Check Labs - Comprehensive metabolic panel with GFR  5. Obesity, current BMI 41.9  Vanessa Dean is currently in the action stage of change. As such, her goal is to continue with weight loss efforts. She has agreed to the Category 1 Plan.   Exercise goals: All adults should avoid inactivity. Some physical activity is better than none, and adults who participate in any amount of physical activity gain some health benefits. Adults should also include muscle-strengthening activities that involve all major muscle groups on 2 or more days a week. Remain active daily.  Behavioral modification strategies: increasing lean protein intake, decreasing simple carbohydrates, increasing vegetables, increasing water intake, no skipping meals, meal planning and cooking strategies, keeping healthy foods in the home, ways to avoid boredom eating, and planning for success.  Vanessa Dean has agreed to follow-up with our clinic in 4 weeks. She was informed  of the importance of frequent follow-up visits to maximize her success with intensive lifestyle modifications for her multiple health conditions.   Vanessa Dean was informed we would discuss her lab results at her next visit unless there is a critical issue that needs to be addressed sooner. Vanessa Dean agreed to  keep her next visit at the agreed upon time to discuss these results.  Objective:   Blood pressure 130/72, pulse 91, temperature 98 F (36.7 C), height 5' 5 (1.651 m), weight 251 lb (113.9 kg), SpO2 99%. Body mass index is 41.77 kg/m.  General: Cooperative, alert, well developed, in no acute distress. HEENT: Conjunctivae and lids unremarkable. Cardiovascular: Regular rhythm.  Lungs: Normal work of breathing. Neurologic: No focal deficits.   Lab Results  Component Value Date   CREATININE 1.1 06/05/2023   BUN 14 06/05/2023   NA 141 06/05/2023   K 3.9 06/05/2023   CL 103 06/05/2023   CO2 32 (A) 06/05/2023   Lab Results  Component Value Date   ALT 17 06/05/2023   AST 21 06/05/2023   ALKPHOS 121 06/05/2023   BILITOT 0.6 03/26/2023   Lab Results  Component Value Date   HGBA1C 5.6 09/10/2023   HGBA1C 5.8 (H) 03/26/2023   HGBA1C 5.9 (H) 11/21/2022   HGBA1C 5.7 (H) 12/06/2021   HGBA1C 5.6 07/19/2021   Lab Results  Component Value Date   INSULIN  9.5 03/26/2023   INSULIN  16.8 11/21/2022   INSULIN  7.7 12/06/2021   INSULIN  16.4 07/19/2021   INSULIN  12.3 03/17/2020   Lab Results  Component Value Date   TSH 0.73 03/04/2021   Lab Results  Component Value Date   CHOL 153 03/26/2023   HDL 67 03/26/2023   LDLCALC 73 03/26/2023   TRIG 67 03/26/2023   CHOLHDL 2.3 03/26/2023   Lab Results  Component Value Date   VD25OH 55.8 09/10/2023   VD25OH 41.8 03/26/2023   VD25OH 50.7 11/21/2022   Lab Results  Component Value Date   WBC 6.0 06/05/2023   HGB 13.3 03/04/2021   HCT 39 03/04/2021   MCV 91 09/09/2019   PLT 275 03/04/2021   Lab Results  Component Value Date   IRON 70 06/05/2023   Attestation Statements:   Reviewed by clinician on day of visit: allergies, medications, problem list, medical history, surgical history, family history, social history, and previous encounter notes.  I have reviewed the above documentation for accuracy and completeness, and I agree  with the above. -  Josha Weekley d. Phi Avans, NP-C

## 2023-12-12 LAB — COMPREHENSIVE METABOLIC PANEL WITH GFR
ALT: 24 IU/L (ref 0–32)
AST: 32 IU/L (ref 0–40)
Albumin: 4.2 g/dL (ref 3.8–4.9)
Alkaline Phosphatase: 154 IU/L — ABNORMAL HIGH (ref 49–135)
BUN/Creatinine Ratio: 12 (ref 9–23)
BUN: 11 mg/dL (ref 6–24)
Bilirubin Total: 0.6 mg/dL (ref 0.0–1.2)
CO2: 24 mmol/L (ref 20–29)
Calcium: 9.4 mg/dL (ref 8.7–10.2)
Chloride: 102 mmol/L (ref 96–106)
Creatinine, Ser: 0.94 mg/dL (ref 0.57–1.00)
Globulin, Total: 3.2 g/dL (ref 1.5–4.5)
Glucose: 87 mg/dL (ref 70–99)
Potassium: 3.8 mmol/L (ref 3.5–5.2)
Sodium: 141 mmol/L (ref 134–144)
Total Protein: 7.4 g/dL (ref 6.0–8.5)
eGFR: 71 mL/min/1.73 (ref >59)

## 2023-12-12 LAB — HEMOGLOBIN A1C
Est. average glucose Bld gHb Est-mCnc: 111 mg/dL
Hgb A1c MFr Bld: 5.5 % (ref 4.8–5.6)

## 2023-12-12 LAB — VITAMIN D 25 HYDROXY (VIT D DEFICIENCY, FRACTURES): Vit D, 25-Hydroxy: 58.6 ng/mL (ref 30.0–100.0)

## 2023-12-12 LAB — VITAMIN B12: Vitamin B-12: 827 pg/mL (ref 232–1245)

## 2023-12-12 LAB — INSULIN, RANDOM: INSULIN: 15.3 u[IU]/mL (ref 2.6–24.9)

## 2023-12-15 ENCOUNTER — Encounter (INDEPENDENT_AMBULATORY_CARE_PROVIDER_SITE_OTHER): Payer: Self-pay | Admitting: Adult Health

## 2023-12-17 ENCOUNTER — Encounter (INDEPENDENT_AMBULATORY_CARE_PROVIDER_SITE_OTHER): Payer: Self-pay | Admitting: Adult Health

## 2023-12-17 ENCOUNTER — Other Ambulatory Visit (INDEPENDENT_AMBULATORY_CARE_PROVIDER_SITE_OTHER): Payer: Self-pay | Admitting: Adult Health

## 2023-12-17 MED ORDER — TIRZEPATIDE 10 MG/0.5ML ~~LOC~~ SOAJ: 10.0000 mg | SUBCUTANEOUS | 0 refills | Status: DC

## 2023-12-17 NOTE — Telephone Encounter (Signed)
 Ok to refill Mounjaro ?  LAST APPOINTMENT DATE: 12/11/23 NEXT APPOINTMENT DATE: 01/09/25   [id number] [address], [place] - [address] [place] [address] Phone: [phone] Fax: [phone]  Patient is requesting a refill of the following medications: Requested Prescriptions    No prescriptions requested or ordered in this encounter    Date last filled: 09/10/23 Previously prescribed by Rockie Dalton, NP  Lab Results  Component Value Date   HGBA1C 5.5 12/11/2023   HGBA1C 5.6 09/10/2023   HGBA1C 5.8 (H) 03/26/2023   Lab Results  Component Value Date   MICROALBUR 1.15 06/05/2023   LDLCALC 73 03/26/2023   CREATININE 0.94 12/11/2023   Lab Results  Component Value Date   VD25OH 58.6 12/11/2023   VD25OH 55.8 09/10/2023   VD25OH 41.8 03/26/2023    BP Readings from Last 3 Encounters:  12/11/23 130/72  11/12/23 112/74  10/28/23 134/88

## 2023-12-27 ENCOUNTER — Other Ambulatory Visit (INDEPENDENT_AMBULATORY_CARE_PROVIDER_SITE_OTHER): Payer: Self-pay | Admitting: Adult Health

## 2024-01-10 ENCOUNTER — Telehealth (INDEPENDENT_AMBULATORY_CARE_PROVIDER_SITE_OTHER): Payer: Self-pay

## 2024-01-10 ENCOUNTER — Encounter (INDEPENDENT_AMBULATORY_CARE_PROVIDER_SITE_OTHER): Payer: Self-pay | Admitting: Adult Health

## 2024-01-10 ENCOUNTER — Encounter (INDEPENDENT_AMBULATORY_CARE_PROVIDER_SITE_OTHER): Payer: Self-pay

## 2024-01-10 ENCOUNTER — Ambulatory Visit (INDEPENDENT_AMBULATORY_CARE_PROVIDER_SITE_OTHER): Payer: Self-pay | Admitting: Adult Health

## 2024-01-10 VITALS — BP 117/80 | HR 92 | Temp 98.2°F | Ht 65.0 in | Wt 248.0 lb

## 2024-01-10 DIAGNOSIS — Z7985 Long-term (current) use of injectable non-insulin antidiabetic drugs: Secondary | ICD-10-CM

## 2024-01-10 DIAGNOSIS — G4733 Obstructive sleep apnea (adult) (pediatric): Secondary | ICD-10-CM

## 2024-01-10 DIAGNOSIS — Z6841 Body Mass Index (BMI) 40.0 and over, adult: Secondary | ICD-10-CM

## 2024-01-10 DIAGNOSIS — E1159 Type 2 diabetes mellitus with other circulatory complications: Secondary | ICD-10-CM

## 2024-01-10 DIAGNOSIS — E1169 Type 2 diabetes mellitus with other specified complication: Secondary | ICD-10-CM | POA: Diagnosis not present

## 2024-01-10 DIAGNOSIS — I152 Hypertension secondary to endocrine disorders: Secondary | ICD-10-CM

## 2024-01-10 DIAGNOSIS — E559 Vitamin D deficiency, unspecified: Secondary | ICD-10-CM | POA: Diagnosis not present

## 2024-01-10 DIAGNOSIS — E669 Obesity, unspecified: Secondary | ICD-10-CM

## 2024-01-10 DIAGNOSIS — Z0000 Encounter for general adult medical examination without abnormal findings: Secondary | ICD-10-CM

## 2024-01-10 MED ORDER — VITAMIN D (ERGOCALCIFEROL) 1.25 MG (50000 UNIT) PO CAPS: 50000.0000 [IU] | ORAL_CAPSULE | ORAL | 0 refills | Status: DC

## 2024-01-10 MED ORDER — TIRZEPATIDE 10 MG/0.5ML ~~LOC~~ SOAJ: 10.0000 mg | SUBCUTANEOUS | 0 refills | Status: DC

## 2024-01-10 NOTE — Telephone Encounter (Signed)
 Sent @ 4:30 to [email or web address], letter in my desk in a blue folder.

## 2024-01-10 NOTE — Progress Notes (Signed)
 WEIGHT SUMMARY AND BIOMETRICS  Vitals Temp: 98.2 F (36.8 C) BP: 117/80 Pulse Rate: 92 SpO2: 99 %   Anthropometric Measurements Height: 5' 5 (1.651 m) Weight: 248 lb (112.5 kg) BMI (Calculated): 41.27 Weight at Last Visit: 251lb Weight Lost Since Last Visit: 3lb Weight Gained Since Last Visit: 0lb Starting Weight: 274lb Total Weight Loss (lbs): 26 lb (11.8 kg)   Body Composition  Body Fat %: 44.6 % Fat Mass (lbs): 110.8 lbs Muscle Mass (lbs): 130.8 lbs Total Body Water (lbs): 104.6 lbs Visceral Fat Rating : 14   Other Clinical Data Fasting: Yes Labs: No Today's Visit #: 48 Starting Date: 09/09/19    Chief Complaint:   OBESITY Vanessa Dean is here to discuss her progress with her obesity treatment plan.  She is on the the Category 1 Plan and states she is following her eating plan approximately 60 % of the time.  She states she is exercising: Daily Walking at least 3K steps.  Interim History:  Discussed benefits of walking: Cardiovascular Health:  Improves heart health by strengthening the heart and lowering blood pressure.  Reduces the risk of heart disease, stroke, and cardiovascular diseases.  Weight Management:  Burns calories and helps maintain a healthy weight. Increases metabolism and promotes fat loss. Mental Health:  Releases endorphins, which improve mood and reduce stress. Reduces the risk of depression and anxiety. Musculoskeletal Health: Strengthens leg muscles and improves balance and coordination. Reduces the risk of falls and osteoporosis.  Cognitive Function:  Increases blood flow to the brain, which improves memory and cognitive function. May reduce the risk of dementia  Of Note: 10/19/2022 OV at [facility]- Ozempic  1mg  replaced with Mounjaro  5mg  05/23/2023 Mounjaro  increased from 5 to 7.5 mg on/about  07/09/2023 Mounjaro  7.5mg  increased to 10mg   Subjective:   1. OSA (obstructive sleep apnea) She has been consistently using nightly  CPAP Her goal is to use at least 4 hrs per night  2. Type 2 diabetes mellitus with other specified complication, without long-term current use of insulin  (HCC) Discussed Labs  Latest Reference Range & Units 12/11/23 08:51  Glucose 70 - 99 mg/dL 87  Hemoglobin J8R 4.8 - 5.6 % 5.5  Est. average glucose Bld gHb Est-mCnc mg/dL 888  INSULIN  2.6 - 24.9 uIU/mL 15.3   CBG and A1c both at goal Insulin  level is still above goal of 5  10/19/2022 Ozempic  1mg  replaced with Mounjaro  5mg  05/23/2023 Mounjaro  increased from 5 to 7.5 mg on/about  07/09/2023 Mounjaro  7.5mg  increased to 10mg  Home CBG will range from upper 90s to low 100s She denies sx's of hypoglycemia Denies mass in neck, dysphagia, dyspepsia, persistent hoarseness, abdominal pain, or N/V/C   3. Vitamin D  deficiency Discussed Labs  Latest Reference Range & Units 12/11/23 08:51  Vitamin D , 25-Hydroxy 30.0 - 100.0 ng/mL 58.6   Vit D Level at goal She is on weekly ergocalciferol - denies N/V/Muscle Weakness  4. Healthcare maintenance Discussed Labs  Latest Reference Range & Units 12/11/23 08:51  Vitamin B12 232 - 1,245 pg/mL 827   B12 Level at goal  Discussed benefits of walking: Cardiovascular Health:  Improves heart health by strengthening the heart and lowering blood pressure.  Reduces the risk of heart disease, stroke, and cardiovascular diseases.  Weight Management:  Burns calories and helps maintain a healthy weight. Increases metabolism and promotes fat loss. Mental Health:  Releases endorphins, which improve mood and reduce stress. Reduces the risk of depression and anxiety. Musculoskeletal Health: Strengthens leg muscles and improves  balance and coordination. Reduces the risk of falls and osteoporosis.  Cognitive Function:  Increases blood flow to the brain, which improves memory and cognitive function. May reduce the risk of dementia  She has been walking 3K steps/daily. She reports spending hours a day seated  during meetings.  Recommended utilizing a stand up desk with walking pad. Will draft Letter of Medical Necessity and email to pt when completed  5. Hypertension associated with type 2 diabetes mellitus (HCC) Discussed Labs 12/11/2023 RFE:Zozrumnobuzd, Liver Enzymes, and Kidney Fx- stable BP excellent at OV  Assessment/Plan:   1. OSA (obstructive sleep apnea) Continue with weight loss efforts Strive to use CPAP 4 or more hours nightly  2. Type 2 diabetes mellitus with other specified complication, without long-term current use of insulin  (HCC) Refill tirzepatide  (MOUNJARO ) 10 MG/0.5ML Pen Inject 10 mg into the skin once a week. Dispense: 6 mL, Refills: 0 ordered   3. Vitamin D  deficiency Refill   Vitamin D , Ergocalciferol , ([name] ) 1.25 MG (50000 UNIT) CAPS capsule Take 1 capsule (50,000 Units total) by mouth every 7 (seven) days. Dispense: 8 capsule, Refills: 0 ordered   4. Healthcare maintenance Provided Letter of Medical Necessity for Stand Up Desk/Walking Pad Send to [email or web address]   5. Hypertension associated with type 2 diabetes mellitus (HCC) Continue healthy eating and increased daily steps  6. Obesity, current BMI 41.4 (Primary)  Zoii is currently in the action stage of change. As such, her goal is to continue with weight loss efforts. She has agreed to the Category 1 Plan.   Exercise goals: All adults should avoid inactivity. Some physical activity is better than none, and adults who participate in any amount of physical activity gain some health benefits. Adults should also include muscle-strengthening activities that involve all major muscle groups on 2 or more days a week. Walking [employer] modification strategies: increasing lean protein intake, decreasing simple carbohydrates, increasing vegetables, increasing water intake, meal planning and cooking strategies, keeping healthy foods in the home, ways to avoid boredom eating, and planning for  success.  [name] has agreed to follow-up with our clinic in 4 weeks. She was informed of the importance of frequent follow-up visits to maximize her success with intensive lifestyle modifications for her multiple health conditions.   Objective:   Blood pressure 117/80, pulse 92, temperature 98.2 F (36.8 C), height 5' 5 (1.651 m), weight 248 lb (112.5 kg), SpO2 99%. Body mass index is 41.27 kg/m.  General: Cooperative, alert, well developed, in no acute distress. HEENT: Conjunctivae and lids unremarkable. Cardiovascular: Regular rhythm.  Lungs: Normal work of breathing. Neurologic: No focal deficits.   Lab Results  Component Value Date   CREATININE 0.94 12/11/2023   BUN 11 12/11/2023   NA 141 12/11/2023   K 3.8 12/11/2023   CL 102 12/11/2023   CO2 24 12/11/2023   Lab Results  Component Value Date   ALT 24 12/11/2023   AST 32 12/11/2023   ALKPHOS 154 (H) 12/11/2023   BILITOT 0.6 12/11/2023   Lab Results  Component Value Date   HGBA1C 5.5 12/11/2023   HGBA1C 5.6 09/10/2023   HGBA1C 5.8 (H) 03/26/2023   HGBA1C 5.9 (H) 11/21/2022   HGBA1C 5.7 (H) 12/06/2021   Lab Results  Component Value Date   INSULIN  15.3 12/11/2023   INSULIN  9.5 03/26/2023   INSULIN  16.8 11/21/2022   INSULIN  7.7 12/06/2021   INSULIN  16.4 07/19/2021   Lab Results  Component Value Date   TSH 0.73 03/04/2021  Lab Results  Component Value Date   CHOL 153 03/26/2023   HDL 67 03/26/2023   LDLCALC 73 03/26/2023   TRIG 67 03/26/2023   CHOLHDL 2.3 03/26/2023   Lab Results  Component Value Date   VD25OH 58.6 12/11/2023   VD25OH 55.8 09/10/2023   VD25OH 41.8 03/26/2023   Lab Results  Component Value Date   WBC 6.0 06/05/2023   HGB 13.3 03/04/2021   HCT 39 03/04/2021   MCV 91 09/09/2019   PLT 275 03/04/2021   Lab Results  Component Value Date   IRON 70 06/05/2023   Attestation Statements:   Reviewed by clinician on day of visit: allergies, medications, problem list, medical  history, surgical history, family history, social history, and previous encounter notes.  I have reviewed the above documentation for accuracy and completeness, and I agree with the above. -  Dakari Stabler d. Sahory Nordling, NP-C

## 2024-02-07 ENCOUNTER — Encounter (INDEPENDENT_AMBULATORY_CARE_PROVIDER_SITE_OTHER): Payer: Self-pay | Admitting: Adult Health

## 2024-02-07 ENCOUNTER — Ambulatory Visit (INDEPENDENT_AMBULATORY_CARE_PROVIDER_SITE_OTHER): Payer: Self-pay | Admitting: Adult Health

## 2024-02-07 VITALS — BP 139/83 | HR 85 | Temp 98.0°F | Ht 65.0 in | Wt 246.0 lb

## 2024-02-07 DIAGNOSIS — Z0000 Encounter for general adult medical examination without abnormal findings: Secondary | ICD-10-CM

## 2024-02-07 DIAGNOSIS — Z6841 Body Mass Index (BMI) 40.0 and over, adult: Secondary | ICD-10-CM

## 2024-02-07 DIAGNOSIS — E559 Vitamin D deficiency, unspecified: Secondary | ICD-10-CM

## 2024-02-07 DIAGNOSIS — E1169 Type 2 diabetes mellitus with other specified complication: Secondary | ICD-10-CM

## 2024-02-07 DIAGNOSIS — I152 Hypertension secondary to endocrine disorders: Secondary | ICD-10-CM

## 2024-02-07 DIAGNOSIS — E1159 Type 2 diabetes mellitus with other circulatory complications: Secondary | ICD-10-CM

## 2024-02-07 DIAGNOSIS — Z7985 Long-term (current) use of injectable non-insulin antidiabetic drugs: Secondary | ICD-10-CM

## 2024-02-07 DIAGNOSIS — E669 Obesity, unspecified: Secondary | ICD-10-CM

## 2024-02-07 DIAGNOSIS — E66813 Obesity, class 3: Secondary | ICD-10-CM

## 2024-02-07 MED ORDER — TIRZEPATIDE 10 MG/0.5ML ~~LOC~~ SOAJ: 10.0000 mg | SUBCUTANEOUS | 0 refills | Status: DC

## 2024-02-07 MED ORDER — VITAMIN D (ERGOCALCIFEROL) 1.25 MG (50000 UNIT) PO CAPS: 50000.0000 [IU] | ORAL_CAPSULE | ORAL | 0 refills | Status: AC

## 2024-02-07 NOTE — Progress Notes (Signed)
 WEIGHT SUMMARY AND BIOMETRICS  No data recorded Anthropometric Measurements Height: 5' 5 (1.651 m) Weight at Last Visit: 248lb Starting Weight: 274lb   No data recorded Other Clinical Data Fasting: Yes Labs: No Today's Visit #: 68 Starting Date: 09/09/19    Chief Complaint:   OBESITY Vanessa Dean is here to discuss her progress with her obesity treatment plan.  She is on the the Category 1 Plan +100 and states she is following her eating plan approximately 50 % of the time.   She states she is exercising Walking 30 minutes 7 times per week.   Interim History:  She is currently on weekly Mounjaro  10mg   Denies mass in neck, dysphagia, dyspepsia, persistent hoarseness, abdominal pain, or N/V/C   Patient was counseled on the importance of maintaining healthy lifestyle habits, including balanced nutrition, regular physical activity, and behavioral modifications, while taking antiobesity medication.  Patient verbalized understanding that medication is an adjunct to, not a replacement for, lifestyle changes and that the long-term success and weight maintenance depend on continued adherence to these strategies.  Subjective:   1. Healthcare maintenance Discussed benefits of walking: Cardiovascular Health:  Improves heart health by strengthening the heart and lowering blood pressure.  Reduces the risk of heart disease, stroke, and cardiovascular diseases.  Weight Management:  Burns calories and helps maintain a healthy weight. Increases metabolism and promotes fat loss. Mental Health:  Releases endorphins, which improve mood and reduce stress. Reduces the risk of depression and anxiety. Musculoskeletal Health: Strengthens leg muscles and improves balance and coordination. Reduces the risk of falls and osteoporosis.  Cognitive Function:  Increases blood flow to the brain, which improves memory and cognitive function. May reduce the risk of dementia  Pt was provided a  letter of medical necessity at last OV to request a walking desk from her employer  2. Type 2 diabetes mellitus with other specified complication, without long-term current use of insulin  (HCC) 10/19/2022 OV at [facility]- Ozempic  1mg  replaced with Mounjaro  5mg  05/23/2023 Mounjaro  increased from 5 to 7.5 mg on/about  07/09/2023 Mounjaro  7.5mg  increased to 10mg   3. Hypertension associated with type 2 diabetes mellitus (HCC) She is currently on hydrochlorothiazide  (HYDRODIURIL ) 12.5 MG tablet  atorvastatin  (LIPITOR) 10 MG tablet  valsartan  (DIOVAN ) 40 MG tablet  tirzepatide  (MOUNJARO ) 10 MG/0.5ML Pen   4. Vit D Def  Latest Reference Range & Units 03/26/23 08:37 09/10/23 09:24 12/11/23 08:51  Vitamin D , 25-Hydroxy 30.0 - 100.0 ng/mL 41.8 55.8 58.6   Vit D Level stable  Assessment/Plan:   1. Healthcare maintenance (Primary) Continue daily walking Install walking pad in office  2. Type 2 diabetes mellitus with other specified complication, without long-term current use of insulin  (HCC) Refill  tirzepatide  (MOUNJARO ) 10 MG/0.5ML Pen Inject 10 mg into the skin once a week. Dispense: 6 mL, Refills: 0 ordered   3. Hypertension associated with type 2 diabetes mellitus (HCC) Limit Na+ Continue daily walking  4. Vit D Def Refill Vitamin D , Ergocalciferol , ([name] ) 1.25 MG (50000 UNIT) CAPS capsule Take 1 capsule (50,000 Units total) by mouth every 7 (seven) days. Dispense: 8 capsule, Refills: 0 ordered   5. Obesity, current BMI 41.4  Vanessa Dean is currently in the action stage of change. As such, her goal is to continue with weight loss efforts. She has agreed to the Category 1 Plan. +100  Exercise goals: For substantial health benefits, adults should do at least 150 minutes (2 hours and 30 minutes) a week of moderate-intensity, or 75 minutes (  1 hour and 15 minutes) a week of vigorous-intensity aerobic physical activity, or an equivalent combination of moderate- and vigorous-intensity aerobic  activity. Aerobic activity should be performed in episodes of at least 10 minutes, and preferably, it should be spread throughout the week.  Behavioral modification strategies: increasing lean protein intake, decreasing simple carbohydrates, increasing vegetables, increasing water intake, meal planning and cooking strategies, keeping healthy foods in the home, ways to avoid boredom eating, and planning for success.  Vanessa Dean has agreed to follow-up with our clinic in 4 weeks. She was informed of the importance of frequent follow-up visits to maximize her success with intensive lifestyle modifications for her multiple health conditions.   Objective:   Height 5' 5 (1.651 m). Body mass index is 41.27 kg/m.  General: Cooperative, alert, well developed, in no acute distress. HEENT: Conjunctivae and lids unremarkable. Cardiovascular: Regular rhythm.  Lungs: Normal work of breathing. Neurologic: No focal deficits.   Lab Results  Component Value Date   CREATININE 0.94 12/11/2023   BUN 11 12/11/2023   NA 141 12/11/2023   K 3.8 12/11/2023   CL 102 12/11/2023   CO2 24 12/11/2023   Lab Results  Component Value Date   ALT 24 12/11/2023   AST 32 12/11/2023   ALKPHOS 154 (H) 12/11/2023   BILITOT 0.6 12/11/2023   Lab Results  Component Value Date   HGBA1C 5.5 12/11/2023   HGBA1C 5.6 09/10/2023   HGBA1C 5.8 (H) 03/26/2023   HGBA1C 5.9 (H) 11/21/2022   HGBA1C 5.7 (H) 12/06/2021   Lab Results  Component Value Date   INSULIN  15.3 12/11/2023   INSULIN  9.5 03/26/2023   INSULIN  16.8 11/21/2022   INSULIN  7.7 12/06/2021   INSULIN  16.4 07/19/2021   Lab Results  Component Value Date   TSH 0.73 03/04/2021   Lab Results  Component Value Date   CHOL 153 03/26/2023   HDL 67 03/26/2023   LDLCALC 73 03/26/2023   TRIG 67 03/26/2023   CHOLHDL 2.3 03/26/2023   Lab Results  Component Value Date   VD25OH 58.6 12/11/2023   VD25OH 55.8 09/10/2023   VD25OH 41.8 03/26/2023   Lab Results   Component Value Date   WBC 6.0 06/05/2023   HGB 13.3 03/04/2021   HCT 39 03/04/2021   MCV 91 09/09/2019   PLT 275 03/04/2021   Lab Results  Component Value Date   IRON 70 06/05/2023    Attestation Statements:   Reviewed by clinician on day of visit: allergies, medications, problem list, medical history, surgical history, family history, social history, and previous encounter notes.  I have reviewed the above documentation for accuracy and completeness, and I agree with the above. -  Tamari Redwine d. Zahid Carneiro, NP-C

## 2024-03-11 ENCOUNTER — Encounter (INDEPENDENT_AMBULATORY_CARE_PROVIDER_SITE_OTHER): Payer: Self-pay | Admitting: Adult Health

## 2024-03-11 ENCOUNTER — Ambulatory Visit (INDEPENDENT_AMBULATORY_CARE_PROVIDER_SITE_OTHER): Admitting: Adult Health

## 2024-03-11 VITALS — BP 108/79 | HR 94 | Temp 98.7°F | Ht 65.0 in | Wt 246.0 lb

## 2024-03-11 DIAGNOSIS — E669 Obesity, unspecified: Secondary | ICD-10-CM | POA: Diagnosis not present

## 2024-03-11 DIAGNOSIS — E1159 Type 2 diabetes mellitus with other circulatory complications: Secondary | ICD-10-CM

## 2024-03-11 DIAGNOSIS — Z7985 Long-term (current) use of injectable non-insulin antidiabetic drugs: Secondary | ICD-10-CM

## 2024-03-11 DIAGNOSIS — E66813 Obesity, class 3: Secondary | ICD-10-CM

## 2024-03-11 DIAGNOSIS — I152 Hypertension secondary to endocrine disorders: Secondary | ICD-10-CM

## 2024-03-11 DIAGNOSIS — Z6841 Body Mass Index (BMI) 40.0 and over, adult: Secondary | ICD-10-CM | POA: Diagnosis not present

## 2024-03-11 DIAGNOSIS — E1169 Type 2 diabetes mellitus with other specified complication: Secondary | ICD-10-CM

## 2024-03-11 DIAGNOSIS — I10 Essential (primary) hypertension: Secondary | ICD-10-CM | POA: Diagnosis not present

## 2024-03-11 MED ORDER — TIRZEPATIDE 12.5 MG/0.5ML ~~LOC~~ SOAJ: 12.5000 mg | SUBCUTANEOUS | 0 refills | Status: AC

## 2024-03-11 NOTE — Progress Notes (Signed)
 "    WEIGHT SUMMARY AND BIOMETRICS  Vitals Temp: 98.7 F (37.1 C) BP: 108/79 Pulse Rate: 94 SpO2: 99 %   Anthropometric Measurements Height: 5' 5 (1.651 m) Weight: 246 lb (111.6 kg) BMI (Calculated): 40.94 Weight at Last Visit: 246lb Weight Lost Since Last Visit: 0lb Weight Gained Since Last Visit: 0lb Starting Weight: 274lb Total Weight Loss (lbs): 28 lb (12.7 kg)   Body Composition  Body Fat %: 43.4 % Fat Mass (lbs): 107 lbs Muscle Mass (lbs): 132.6 lbs Total Body Water (lbs): 100.8 lbs Visceral Fat Rating : 14   Other Clinical Data Fasting: yes Labs: No Today's Visit #: 50 Starting Date: 09/09/19    Chief Complaint:   OBESITY Vanessa Dean is here to discuss her progress with her obesity treatment plan.  She is on the the Category 1 Plan and states she is following her eating plan approximately 30 % of the time.  She states she is exercising Walking 30 minutes 1 times per week.  Interim History:  She endorses increased appetite and subsequent snacking over the last month. She is pleased to have maintained her weight over the holiday season.  2026 Health Goals: Maintain good control of T2D Increase water intake Consume quality foods  She is on weekly Mounjaro  10mg - been on this strength since May 2025 Denies mass in neck, dysphagia, dyspepsia, persistent hoarseness, abdominal pain, or N/V/C   Subjective:   1. Type 2 diabetes mellitus with other specified complication, without long-term current use of insulin  (HCC) Fasting CBG will range mid 80 to low 100s She denies sx's of hypogylcemia She is on weekly Mounjaro  10mg - been on this strength since May 2025 Denies mass in neck, dysphagia, dyspepsia, persistent hoarseness, abdominal pain, or N/V/C  She endorses increased appetite and subsequent snacking over the last month. She is agreeable to dose increase of Mounjaro  from 10mg  to 12.5mg   2. Hypertension associated with type 2 diabetes mellitus (HCC) BP  excellent at OV She infrequently checks BP at home- denies sx's of hypotension   Assessment/Plan:   1. Type 2 diabetes mellitus with other specified complication, without long-term current use of insulin  (HCC) (Primary) Refill and INCREASE - tirzepatide  (MOUNJARO ) 12.5 MG/0.5ML Pen; Inject 12.5 mg into the skin once a week.  Dispense: 6 mL; Refill: 0  2. Hypertension associated with type 2 diabetes mellitus (HCC) Increase regular cardiovascular exercise, ie: walking  3. Obesity, current BMI 41.4  Juno is currently in the action stage of change. As such, her goal is to continue with weight loss efforts. She has agreed to the Category 1 Plan.   Exercise goals: All adults should avoid inactivity. Some physical activity is better than none, and adults who participate in any amount of physical activity gain some health benefits. Adults should also include muscle-strengthening activities that involve all major muscle groups on 2 or more days a week. Increase Daily Walking  Behavioral modification strategies: increasing lean protein intake, decreasing simple carbohydrates, increasing vegetables, increasing water intake, decreasing liquid calories, no skipping meals, meal planning and cooking strategies, keeping healthy foods in the home, ways to avoid boredom eating, and planning for success.  Jnae has agreed to follow-up with our clinic in 4 weeks. She was informed of the importance of frequent follow-up visits to maximize her success with intensive lifestyle modifications for her multiple health conditions.   Objective:   Blood pressure 108/79, pulse 94, temperature 98.7 F (37.1 C), height 5' 5 (1.651 m), weight 246 lb (111.6 kg), SpO2  99%. Body mass index is 40.94 kg/m.  General: Cooperative, alert, well developed, in no acute distress. HEENT: Conjunctivae and lids unremarkable. Cardiovascular: Regular rhythm.  Lungs: Normal work of breathing. Neurologic: No focal deficits.   Lab  Results  Component Value Date   CREATININE 0.94 12/11/2023   BUN 11 12/11/2023   NA 141 12/11/2023   K 3.8 12/11/2023   CL 102 12/11/2023   CO2 24 12/11/2023   Lab Results  Component Value Date   ALT 24 12/11/2023   AST 32 12/11/2023   ALKPHOS 154 (H) 12/11/2023   BILITOT 0.6 12/11/2023   Lab Results  Component Value Date   HGBA1C 5.5 12/11/2023   HGBA1C 5.6 09/10/2023   HGBA1C 5.8 (H) 03/26/2023   HGBA1C 5.9 (H) 11/21/2022   HGBA1C 5.7 (H) 12/06/2021   Lab Results  Component Value Date   INSULIN  15.3 12/11/2023   INSULIN  9.5 03/26/2023   INSULIN  16.8 11/21/2022   INSULIN  7.7 12/06/2021   INSULIN  16.4 07/19/2021   Lab Results  Component Value Date   TSH 0.73 03/04/2021   Lab Results  Component Value Date   CHOL 153 03/26/2023   HDL 67 03/26/2023   LDLCALC 73 03/26/2023   TRIG 67 03/26/2023   CHOLHDL 2.3 03/26/2023   Lab Results  Component Value Date   VD25OH 58.6 12/11/2023   VD25OH 55.8 09/10/2023   VD25OH 41.8 03/26/2023   Lab Results  Component Value Date   WBC 6.0 06/05/2023   HGB 13.3 03/04/2021   HCT 39 03/04/2021   MCV 91 09/09/2019   PLT 275 03/04/2021   Lab Results  Component Value Date   IRON 70 06/05/2023   Attestation Statements:   Reviewed by clinician on day of visit: allergies, medications, problem list, medical history, surgical history, family history, social history, and previous encounter notes.  I have reviewed the above documentation for accuracy and completeness, and I agree with the above. -  Shawntel Farnworth d. Lynnix Schoneman, NP-C "

## 2024-04-08 ENCOUNTER — Ambulatory Visit (INDEPENDENT_AMBULATORY_CARE_PROVIDER_SITE_OTHER): Admitting: Adult Health

## 2024-04-09 ENCOUNTER — Ambulatory Visit (INDEPENDENT_AMBULATORY_CARE_PROVIDER_SITE_OTHER): Admitting: Adult Health

## 2024-04-09 VITALS — BP 123/83 | HR 85 | Temp 98.4°F | Ht 65.0 in | Wt 243.0 lb

## 2024-04-09 DIAGNOSIS — E1159 Type 2 diabetes mellitus with other circulatory complications: Secondary | ICD-10-CM

## 2024-04-09 DIAGNOSIS — I10 Essential (primary) hypertension: Secondary | ICD-10-CM | POA: Diagnosis not present

## 2024-04-09 DIAGNOSIS — Z7985 Long-term (current) use of injectable non-insulin antidiabetic drugs: Secondary | ICD-10-CM | POA: Diagnosis not present

## 2024-04-09 DIAGNOSIS — E559 Vitamin D deficiency, unspecified: Secondary | ICD-10-CM

## 2024-04-09 DIAGNOSIS — E1169 Type 2 diabetes mellitus with other specified complication: Secondary | ICD-10-CM

## 2024-04-09 DIAGNOSIS — Z6841 Body Mass Index (BMI) 40.0 and over, adult: Secondary | ICD-10-CM

## 2024-04-09 DIAGNOSIS — E669 Obesity, unspecified: Secondary | ICD-10-CM | POA: Diagnosis not present

## 2024-04-09 NOTE — Progress Notes (Signed)
 "    WEIGHT SUMMARY AND BIOMETRICS  Vitals Temp: 98.4 F (36.9 C) BP: 123/83 Pulse Rate: 85 SpO2: 96 %   Anthropometric Measurements Height: 5' 5 (1.651 m) Weight: 243 lb (110.2 kg) BMI (Calculated): 40.44 Weight at Last Visit: 246 lb Weight Lost Since Last Visit: 3 lb Weight Gained Since Last Visit: 0 Starting Weight: 274 lb Total Weight Loss (lbs): 31 lb (14.1 kg)   Body Composition  Body Fat %: 49.9 % Fat Mass (lbs): 121.6 lbs Muscle Mass (lbs): 115.8 lbs Total Body Water (lbs): 96.8 lbs Visceral Fat Rating : 16   Other Clinical Data Fasting: no Labs: no Today's Visit #: 51 Starting Date: 09/09/19    Chief Complaint:   OBESITY Vanessa Dean is here to discuss her progress with her obesity treatment plan.  She is on the the Category 1 Plan +100 and states she is following her eating plan approximately 40 % of the time.  She states she is exercising walking and being active daily 30/?? minutes 1/7 times per week.  Interim History:  03/21/2024 Mounjaro  10mg  increased to 12.mg Denies mass in neck, dysphagia, dyspepsia, persistent hoarseness, abdominal pain, or N/V/C   Recommend checking fasting labs and IC late Winter 2026  Subjective:   1. Type 2 diabetes mellitus with other specified complication, without long-term current use of insulin  (HCC) 10/19/2022 OV at [facility]- Ozempic  1mg  replaced with Mounjaro  5mg  05/23/2023 Mounjaro  increased from 5 to 7.5 mg on/about  07/09/2023 Mounjaro  7.5mg  increased to 10mg  03/21/2024 Mounjaro  10mg  increased to 12.mg Denies mass in neck, dysphagia, dyspepsia, persistent hoarseness, abdominal pain, or N/V/C   2. Hypertension associated with type 2 diabetes mellitus (HCC) BP slightly elevated at OV She denies acute cardiac sx's at present She is currently on hydrochlorothiazide  (HYDRODIURIL ) 12.5 MG tablet  atorvastatin  (LIPITOR) 10 MG tablet  valsartan  (DIOVAN ) 40 MG tablet  tirzepatide  (MOUNJARO ) 12.5 MG/0.5ML Pen   3. Vitamin  D deficiency   Latest Reference Range & Units 12/11/23 08:51  Vitamin D , 25-Hydroxy 30.0 - 100.0 ng/mL 58.6   Vit D Level stable and at goal  Assessment/Plan:   1. Type 2 diabetes mellitus with other specified complication, without long-term current use of insulin  (HCC) (Primary) Patient was counseled on the importance of maintaining healthy lifestyle habits, including balanced nutrition, regular physical activity, and behavioral modifications, while taking antiobesity medication.   Patient verbalized understanding that medication is an adjunct to, not a replacement for, lifestyle changes and that the long-term success and weight maintenance depend on continued adherence to these strategies.   2. Hypertension associated with type 2 diabetes mellitus (HCC) Limit Na+ intake Increase water intake Increase daily steps Continue hydrochlorothiazide  (HYDRODIURIL ) 12.5 MG tablet  atorvastatin  (LIPITOR) 10 MG tablet  valsartan  (DIOVAN ) 40 MG tablet  tirzepatide  (MOUNJARO ) 12.5 MG/0.5ML Pen   3. Vitamin D  deficiency Monitor Labs  4. Obesity, current BMI 40.5  Vanessa Dean is currently in the action stage of change. As such, her goal is to continue with weight loss efforts. She has agreed to the Category 1 Plan. +100  Exercise goals: For substantial health benefits, adults should do at least 150 minutes (2 hours and 30 minutes) a week of moderate-intensity, or 75 minutes (1 hour and 15 minutes) a week of vigorous-intensity aerobic physical activity, or an equivalent combination of moderate- and vigorous-intensity aerobic activity. Aerobic activity should be performed in episodes of at least 10 minutes, and preferably, it should be spread throughout the week.  Behavioral modification strategies: increasing lean  protein intake, decreasing simple carbohydrates, increasing vegetables, increasing water intake, no skipping meals, meal planning and cooking strategies, keeping healthy foods in the home, and  planning for success.  Vanessa Dean has agreed to follow-up with our clinic in 4 weeks. She was informed of the importance of frequent follow-up visits to maximize her success with intensive lifestyle modifications for her multiple health conditions.   Check Fasting Labs and IC late Winter 2026  Objective:   Blood pressure 123/83, pulse 85, temperature 98.4 F (36.9 C), height 5' 5 (1.651 m), weight 243 lb (110.2 kg), SpO2 96%. Body mass index is 40.44 kg/m.  General: Cooperative, alert, well developed, in no acute distress. HEENT: Conjunctivae and lids unremarkable. Cardiovascular: Regular rhythm.  Lungs: Normal work of breathing. Neurologic: No focal deficits.   Lab Results  Component Value Date   CREATININE 0.94 12/11/2023   BUN 11 12/11/2023   NA 141 12/11/2023   K 3.8 12/11/2023   CL 102 12/11/2023   CO2 24 12/11/2023   Lab Results  Component Value Date   ALT 24 12/11/2023   AST 32 12/11/2023   ALKPHOS 154 (H) 12/11/2023   BILITOT 0.6 12/11/2023   Lab Results  Component Value Date   HGBA1C 5.5 12/11/2023   HGBA1C 5.6 09/10/2023   HGBA1C 5.8 (H) 03/26/2023   HGBA1C 5.9 (H) 11/21/2022   HGBA1C 5.7 (H) 12/06/2021   Lab Results  Component Value Date   INSULIN  15.3 12/11/2023   INSULIN  9.5 03/26/2023   INSULIN  16.8 11/21/2022   INSULIN  7.7 12/06/2021   INSULIN  16.4 07/19/2021   Lab Results  Component Value Date   TSH 0.73 03/04/2021   Lab Results  Component Value Date   CHOL 153 03/26/2023   HDL 67 03/26/2023   LDLCALC 73 03/26/2023   TRIG 67 03/26/2023   CHOLHDL 2.3 03/26/2023   Lab Results  Component Value Date   VD25OH 58.6 12/11/2023   VD25OH 55.8 09/10/2023   VD25OH 41.8 03/26/2023   Lab Results  Component Value Date   WBC 6.0 06/05/2023   HGB 13.3 03/04/2021   HCT 39 03/04/2021   MCV 91 09/09/2019   PLT 275 03/04/2021   Lab Results  Component Value Date   IRON 70 06/05/2023   Attestation Statements:   Reviewed by clinician on day of  visit: allergies, medications, problem list, medical history, surgical history, family history, social history, and previous encounter notes.  Time spent on visit including pre-visit chart review and post-visit care and charting was 28 minutes.   I have reviewed the above documentation for accuracy and completeness, and I agree with the above. -  Ura Yingling d. Apryll Hinkle, NP-C "

## 2024-05-06 ENCOUNTER — Ambulatory Visit (INDEPENDENT_AMBULATORY_CARE_PROVIDER_SITE_OTHER): Admitting: Adult Health

## 2024-05-08 ENCOUNTER — Ambulatory Visit (INDEPENDENT_AMBULATORY_CARE_PROVIDER_SITE_OTHER): Admitting: Family Medicine
